# Patient Record
Sex: Female | Born: 1975 | Race: White | Hispanic: No | Marital: Married | Smoking: Former smoker
Health system: Southern US, Community
[De-identification: ages and names within clinical notes are randomized; demographics above are authoritative.]

## PROBLEM LIST (undated history)

## (undated) DIAGNOSIS — R011 Cardiac murmur, unspecified: Secondary | ICD-10-CM

## (undated) DIAGNOSIS — E785 Hyperlipidemia, unspecified: Secondary | ICD-10-CM

## (undated) HISTORY — PX: APPENDECTOMY: SHX54

## (undated) HISTORY — PX: ABDOMINAL HYSTERECTOMY: SHX81

## (undated) HISTORY — PX: OTHER SURGICAL HISTORY: SHX169

## (undated) HISTORY — DX: Hyperlipidemia, unspecified: E78.5

## (undated) HISTORY — DX: Cardiac murmur, unspecified: R01.1

## (undated) HISTORY — PX: JOINT REPLACEMENT: SHX530

## (undated) HISTORY — PX: KNEE ARTHROSCOPY: SUR90

## (undated) HISTORY — PX: CHOLECYSTECTOMY: SHX55

## (undated) HISTORY — PX: DILATION AND CURETTAGE, DIAGNOSTIC / THERAPEUTIC: SUR384

## (undated) HISTORY — PX: TUBAL LIGATION: SHX77

---

## 1998-04-08 ENCOUNTER — Inpatient Hospital Stay (HOSPITAL_COMMUNITY): Admission: AD | Admit: 1998-04-08 | Discharge: 1998-04-08 | Payer: Self-pay | Admitting: Obstetrics & Gynecology

## 1998-04-10 ENCOUNTER — Inpatient Hospital Stay (HOSPITAL_COMMUNITY): Admission: AD | Admit: 1998-04-10 | Discharge: 1998-04-10 | Payer: Self-pay | Admitting: Obstetrics

## 1998-08-05 ENCOUNTER — Emergency Department (HOSPITAL_COMMUNITY): Admission: EM | Admit: 1998-08-05 | Discharge: 1998-08-05 | Payer: Self-pay | Admitting: Emergency Medicine

## 1999-05-29 ENCOUNTER — Emergency Department (HOSPITAL_COMMUNITY): Admission: EM | Admit: 1999-05-29 | Discharge: 1999-05-29 | Payer: Self-pay | Admitting: Emergency Medicine

## 1999-06-27 ENCOUNTER — Inpatient Hospital Stay (HOSPITAL_COMMUNITY): Admission: EM | Admit: 1999-06-27 | Discharge: 1999-06-28 | Payer: Self-pay | Admitting: Emergency Medicine

## 1999-06-27 ENCOUNTER — Encounter: Payer: Self-pay | Admitting: Emergency Medicine

## 1999-12-19 ENCOUNTER — Emergency Department (HOSPITAL_COMMUNITY): Admission: EM | Admit: 1999-12-19 | Discharge: 1999-12-19 | Payer: Self-pay | Admitting: Emergency Medicine

## 2001-02-16 ENCOUNTER — Emergency Department (HOSPITAL_COMMUNITY): Admission: EM | Admit: 2001-02-16 | Discharge: 2001-02-16 | Payer: Self-pay | Admitting: *Deleted

## 2007-03-27 ENCOUNTER — Ambulatory Visit (HOSPITAL_BASED_OUTPATIENT_CLINIC_OR_DEPARTMENT_OTHER): Admission: RE | Admit: 2007-03-27 | Discharge: 2007-03-27 | Payer: Self-pay | Admitting: Orthopedic Surgery

## 2013-01-05 ENCOUNTER — Emergency Department (HOSPITAL_COMMUNITY)
Admission: EM | Admit: 2013-01-05 | Discharge: 2013-01-05 | Disposition: A | Discharge status: Home / Self Care | Payer: Self-pay | Attending: Emergency Medicine | Admitting: Emergency Medicine

## 2013-01-05 ENCOUNTER — Emergency Department (HOSPITAL_COMMUNITY): Payer: Self-pay

## 2013-01-05 ENCOUNTER — Encounter (HOSPITAL_COMMUNITY): Payer: Self-pay | Admitting: Emergency Medicine

## 2013-01-05 DIAGNOSIS — X500XXA Overexertion from strenuous movement or load, initial encounter: Secondary | ICD-10-CM | POA: Insufficient documentation

## 2013-01-05 DIAGNOSIS — Z87891 Personal history of nicotine dependence: Secondary | ICD-10-CM | POA: Insufficient documentation

## 2013-01-05 DIAGNOSIS — Z79899 Other long term (current) drug therapy: Secondary | ICD-10-CM | POA: Insufficient documentation

## 2013-01-05 DIAGNOSIS — S92901A Unspecified fracture of right foot, initial encounter for closed fracture: Secondary | ICD-10-CM

## 2013-01-05 DIAGNOSIS — Y92009 Unspecified place in unspecified non-institutional (private) residence as the place of occurrence of the external cause: Secondary | ICD-10-CM | POA: Insufficient documentation

## 2013-01-05 DIAGNOSIS — Y9341 Activity, dancing: Secondary | ICD-10-CM | POA: Insufficient documentation

## 2013-01-05 DIAGNOSIS — S92909A Unspecified fracture of unspecified foot, initial encounter for closed fracture: Secondary | ICD-10-CM | POA: Insufficient documentation

## 2013-01-05 MED ORDER — MELOXICAM 15 MG PO TABS: 15.0000 mg | ORAL_TABLET | Freq: Every day | ORAL | Status: DC

## 2013-01-05 NOTE — ED Provider Notes (Signed)
History  This chart was scribed for Arthor Captain, PA-C working with Richardean Canal, MD by Shari Heritage, ED Scribe. This patient was seen in room WTR8/WTR8 and the patient's care was started at 1839.  CSN: [id number]  Arrival date & time 01/05/13  1719   First MD Initiated Contact with Patient 01/05/13 1839      Chief Complaint  Patient presents with  . Foot Pain     The history is provided by the patient. No language interpreter was used.    HPI Comments: Nichole Gonzalez is a 37 y.o. female who presents to the Emergency Department complaining of moderate to severe, constant, non-radiating, dull right foot pain and swelling onset 2.5 days ago. Patient says that she was dancing at home early Saturday morning when she heard a pop in her right foot. Patient says that she had immediate pain when it occurred and swelling began several hours later. Patient is ambulatory with pain and limp. She reports that she has fractured this same foot before and had surgery for repair. Patient's other surgical history includes cholecystectomy, appendectomy and tubal ligation.  History reviewed. No pertinent past medical history.  Past Surgical History  Procedure Date  . Knee arthroscopy   . Cholecystectomy   . Appendectomy   . Tubal ligation   . Right foot     No family history on file.  History  Substance Use Topics  . Smoking status: Former Games developer  . Smokeless tobacco: Not on file  . Alcohol Use: No    OB History    Grav Para Term Preterm Abortions TAB SAB Ect Mult Living                  Review of Systems A complete 10 system review of systems was obtained and all systems are negative except as noted in the HPI and PMH.   Allergies  Codeine  Home Medications   Current Outpatient Rx  Name  Route  Sig  Dispense  Refill  . CAFFEINE 200 MG PO TABS   Oral   Take 200 mg by mouth daily. For energy.         Marland Kitchen FAMOTIDINE 20 MG PO TABS   Oral   Take 20 mg by mouth daily.        Marland Kitchen LORATADINE 10 MG PO TABS   Oral   Take 10 mg by mouth daily.         . ADULT MULTIVITAMIN W/MINERALS CH   Oral   Take 1 tablet by mouth daily.         Marland Kitchen NICOTINE 21 MG/24HR TD PT24   Transdermal   Place 1 patch onto the skin daily.         Marland Kitchen ULTRA SLIM QUICK PO   Oral   Take 1 capsule by mouth 2 (two) times daily.           Triage Vitals: BP 146/88  Pulse 79  Temp 98.2 F (36.8 C)  Resp 18  SpO2 98%  LMP 01/01/2013  Physical Exam  Nursing note and vitals reviewed. Constitutional: She is oriented to person, place, and time. She appears well-developed and well-nourished. No distress.  HENT:  Head: Normocephalic and atraumatic.  Eyes: EOM are normal.  Neck: Neck supple. No tracheal deviation present.  Cardiovascular: Normal rate.   Pulmonary/Chest: Effort normal. No respiratory distress.  Musculoskeletal: Normal range of motion. She exhibits tenderness.       Right ankle: tenderness. Lateral malleolus  tenderness found.       Right foot: She exhibits bony tenderness and swelling.       Swelling of the lateral aspect of the right foot and the dorsal surface with deep ecchymosis. Swelling extends to the lateral malleolus. There's ecchymosis of the 5th toe. Minimally tender to palpation. Tender with ambulation. Neurovascularly intact.  Neurological: She is alert and oriented to person, place, and time.  Skin: Skin is warm and dry.  Psychiatric: She has a normal mood and affect. Her behavior is normal.    ED Course  Procedures (including critical care time) DIAGNOSTIC STUDIES: Oxygen Saturation is 98% on room air, normal by my interpretation.    COORDINATION OF CARE: 8:32 PM- Patient informed of current plan for treatment and evaluation and agrees with plan at this time.    Dg Foot Complete Right  01/05/2013  *RADIOLOGY REPORT*  Clinical Data: Injury involving the lateral aspect of the foot 2 days ago  RIGHT FOOT COMPLETE - 3+ VIEW  Comparison: None.   Findings:  There is a nondisplaced spiral/oblique fracture of the distal metadiaphysis of the fifth metatarsal.  The fracture does not appear to extend into the adjacent MCP joint. Minimal amount of expected soft tissue swelling.  No radiopaque foreign body.  No other fractures identified.  Incidental note is made of a small os peroneus.  Joint spaces are preserved.  Mild hallux valgus deformity.  IMPRESSION: Nondisplaced spiral / oblique fracture of the distal metadiaphysis of the fifth metatarsal without definite intra-articular extension.   Original Report Authenticated By: Tacey Ruiz, MD      1. Foot fracture, right       MDM    8:35 PM Filed Vitals:   01/05/13 1827  BP: 146/88  Pulse: 79  Temp: 98.2 F (36.8 C)  Resp: 18  SpO2: 98%    Shouldn't with nondisplaced oblique fracture of the distal fifth metatarsal of the right foot.  I have reviewed the x-ray personally.  I've also reviewed x-ray with Dr. Silverio Lay.  Patient will be given postop shoe and sufficient padding.  She will also be given crutches and instructed not to apply pressure to the foot at all.  She will also be provided followup with Dr. Ave Filter at [facility].  Patient expresses understanding and agrees with plan.   I personally performed the services described in this documentation, which was scribed in my presence. The recorded information has been reviewed and is accurate.     Arthor Captain, PA-C 01/06/13 1034

## 2013-01-05 NOTE — ED Notes (Signed)
Pt reports dancing around at home Saturday night and heard a pop in right foot. Pt has pain and swelling and is 10/10 when ambulating and resting 5/10. Swelling noted to right foot. Pt ambulated with limp. Pulses present in right foot.

## 2013-01-06 NOTE — ED Provider Notes (Signed)
Medical screening examination/treatment/procedure(s) were performed by non-physician practitioner and as supervising physician I was immediately available for consultation/collaboration.   Richardean Canal, MD 01/06/13 1452

## 2013-11-13 ENCOUNTER — Encounter: Payer: Self-pay | Admitting: Internal Medicine

## 2014-08-16 ENCOUNTER — Emergency Department (HOSPITAL_COMMUNITY)
Admission: EM | Admit: 2014-08-16 | Discharge: 2014-08-16 | Disposition: A | Discharge status: Home / Self Care | Payer: BC Managed Care – PPO | Attending: Emergency Medicine | Admitting: Emergency Medicine

## 2014-08-16 ENCOUNTER — Encounter (HOSPITAL_COMMUNITY): Payer: Self-pay | Admitting: Emergency Medicine

## 2014-08-16 DIAGNOSIS — M7710 Lateral epicondylitis, unspecified elbow: Secondary | ICD-10-CM | POA: Insufficient documentation

## 2014-08-16 DIAGNOSIS — M25529 Pain in unspecified elbow: Secondary | ICD-10-CM | POA: Insufficient documentation

## 2014-08-16 DIAGNOSIS — F17200 Nicotine dependence, unspecified, uncomplicated: Secondary | ICD-10-CM | POA: Diagnosis not present

## 2014-08-16 DIAGNOSIS — Z79899 Other long term (current) drug therapy: Secondary | ICD-10-CM | POA: Diagnosis not present

## 2014-08-16 DIAGNOSIS — M7711 Lateral epicondylitis, right elbow: Secondary | ICD-10-CM

## 2014-08-16 MED ORDER — HYDROCODONE-ACETAMINOPHEN 5-325 MG PO TABS: 1.0000 | ORAL_TABLET | Freq: Four times a day (QID) | ORAL | Status: DC | PRN

## 2014-08-16 MED ORDER — IBUPROFEN 800 MG PO TABS: 800.0000 mg | ORAL_TABLET | Freq: Three times a day (TID) | ORAL | Status: DC

## 2014-08-16 NOTE — Discharge Instructions (Signed)
Tennis Elbow Your caregiver has diagnosed you with a condition often referred to as "tennis elbow." This results from small tears or soreness (inflammation) at the start (origin) of the extensor muscles of the forearm. Although the condition is often called tennis or golfer's elbow, it is caused by any repetitive action performed by your elbow. HOME CARE INSTRUCTIONS  If the condition has been short lived, rest may be the only treatment required. Using your opposite hand or arm to perform the task may help. Even changing your grip may help rest the extremity. These may even prevent the condition from recurring.  Longer standing problems, however, will often be relieved faster by:  Using anti-inflammatory agents.  Applying ice packs for 30 minutes at the end of the working day, at bed time, or when activities are finished.  Your caregiver may also have you wear a splint or sling. This will allow the inflamed tendon to heal. At times, steroid injections aided with a local anesthetic will be required along with splinting for 1 to 2 weeks. Two to three steroid injections will often solve the problem. In some long standing cases, the inflamed tendon does not respond to conservative (non-surgical) therapy. Then surgery may be required to repair it. MAKE SURE YOU:   Understand these instructions.  Will watch your condition.  Will get help right away if you are not doing well or get worse. Document Released: 12/10/2005 Document Revised: 03/03/2012 Document Reviewed: 07/28/2008 [facility] [employer] Patient Information 2015 [place], [place]. This information is not intended to replace advice given to you by your health care provider. Make sure you discuss any questions you have with your health care provider.  Lateral Epicondylitis (Tennis Elbow) with Rehab Lateral epicondylitis involves inflammation and pain around the outer portion of the elbow. The pain is caused by inflammation of the tendons in the forearm  that bring back (extend) the wrist. Lateral epicondylitis is also called tennis elbow, because it is very common in tennis players. However, it may occur in any individual who extends the wrist repetitively. If lateral epicondylitis is left untreated, it may become a chronic problem. SYMPTOMS   Pain, tenderness, and inflammation on the outer (lateral) side of the elbow.  Pain or weakness with gripping activities.  Pain that increases with wrist-twisting motions (playing tennis, using a screwdriver, opening a door or a jar).  Pain with lifting objects, including a coffee cup. CAUSES  Lateral epicondylitis is caused by inflammation of the tendons that extend the wrist. Causes of injury may include:  Repetitive stress and strain on the muscles and tendons that extend the wrist.  Sudden change in activity level or intensity.  Incorrect grip in racquet sports.  Incorrect grip size of racquet (often too large).  Incorrect hitting position or technique (usually backhand, leading with the elbow).  Using a racket that is too heavy. RISK INCREASES WITH:  Sports or occupations that require repetitive and/or strenuous forearm and wrist movements (tennis, squash, racquetball, carpentry).  Poor wrist and forearm strength and flexibility.  Failure to warm up properly before activity.  Resuming activity before healing, rehabilitation, and conditioning are complete. PREVENTION   Warm up and stretch properly before activity.  Maintain physical fitness:  Strength, flexibility, and endurance.  Cardiovascular fitness.  Wear and use properly fitted equipment.  Learn and use proper technique and have a coach correct improper technique.  Wear a tennis elbow (counterforce) brace. PROGNOSIS  The course of this condition depends on the degree of the injury. If treated  properly, acute cases (symptoms lasting less than 4 weeks) are often resolved in 2 to 6 weeks. Chronic (longer lasting cases)  often resolve in 3 to 6 months but may require physical therapy. RELATED COMPLICATIONS   Frequently recurring symptoms, resulting in a chronic problem. Properly treating the problem the first time decreases frequency of recurrence.  Chronic inflammation, scarring tendon degeneration, and partial tendon tear, requiring surgery.  Delayed healing or resolution of symptoms. TREATMENT  Treatment first involves the use of ice and medicine to reduce pain and inflammation. Strengthening and stretching exercises may help reduce discomfort if performed regularly. These exercises may be performed at home if the condition is an acute injury. Chronic cases may require a referral to a physical therapist for evaluation and treatment. Your caregiver may advise a corticosteroid injection to help reduce inflammation. Rarely, surgery is needed. MEDICATION  If pain medicine is needed, nonsteroidal anti-inflammatory medicines (aspirin and ibuprofen), or other minor pain relievers (acetaminophen), are often advised.  Do not take pain medicine for 7 days before surgery.  Prescription pain relievers may be given, if your caregiver thinks they are needed. Use only as directed and only as much as you need.  Corticosteroid injections may be recommended. These injections should be reserved only for the most severe cases, because they can only be given a certain number of times. HEAT AND COLD  Cold treatment (icing) should be applied for 10 to 15 minutes every 2 to 3 hours for inflammation and pain, and immediately after activity that aggravates your symptoms. Use ice packs or an ice massage.  Heat treatment may be used before performing stretching and strengthening activities prescribed by your caregiver, physical therapist, or athletic trainer. Use a heat pack or a warm water soak. SEEK MEDICAL CARE IF: Symptoms get worse or do not improve in 2 weeks, despite treatment. EXERCISES  RANGE OF MOTION (ROM) AND  STRETCHING EXERCISES - Epicondylitis, Lateral (Tennis Elbow) These exercises may help you when beginning to rehabilitate your injury. Your symptoms may go away with or without further involvement from your physician, physical therapist, or athletic trainer. While completing these exercises, remember:   Restoring tissue flexibility helps normal motion to return to the joints. This allows healthier, less painful movement and activity.  An effective stretch should be held for at least 30 seconds.  A stretch should never be painful. You should only feel a gentle lengthening or release in the stretched tissue. RANGE OF MOTION - Wrist Flexion, Active-Assisted  Extend your right / left elbow with your fingers pointing down.*  Gently pull the back of your hand towards you, until you feel a gentle stretch on the top of your forearm.  Hold this position for __________ seconds. Repeat __________ times. Complete this exercise __________ times per day.  *If directed by your physician, physical therapist or athletic trainer, complete this stretch with your elbow bent, rather than extended. RANGE OF MOTION - Wrist Extension, Active-Assisted  Extend your right / left elbow and turn your palm upwards.*  Gently pull your palm and fingertips back, so your wrist extends and your fingers point more toward the ground.  You should feel a gentle stretch on the inside of your forearm.  Hold this position for __________ seconds. Repeat __________ times. Complete this exercise __________ times per day. *If directed by your physician, physical therapist or athletic trainer, complete this stretch with your elbow bent, rather than extended. STRETCH - Wrist Flexion  Place the back of your right / left  hand on a tabletop, leaving your elbow slightly bent. Your fingers should point away from your body.  Gently press the back of your hand down onto the table by straightening your elbow. You should feel a stretch on  the top of your forearm.  Hold this position for __________ seconds. Repeat __________ times. Complete this stretch __________ times per day.  STRETCH - Wrist Extension   Place your right / left fingertips on a tabletop, leaving your elbow slightly bent. Your fingers should point backwards.  Gently press your fingers and palm down onto the table by straightening your elbow. You should feel a stretch on the inside of your forearm.  Hold this position for __________ seconds. Repeat __________ times. Complete this stretch __________ times per day.  STRENGTHENING EXERCISES - Epicondylitis, Lateral (Tennis Elbow) These exercises may help you when beginning to rehabilitate your injury. They may resolve your symptoms with or without further involvement from your physician, physical therapist, or athletic trainer. While completing these exercises, remember:   Muscles can gain both the endurance and the strength needed for everyday activities through controlled exercises.  Complete these exercises as instructed by your physician, physical therapist or athletic trainer. Increase the resistance and repetitions only as guided.  You may experience muscle soreness or fatigue, but the pain or discomfort you are trying to eliminate should never worsen during these exercises. If this pain does get worse, stop and make sure you are following the directions exactly. If the pain is still present after adjustments, discontinue the exercise until you can discuss the trouble with your caregiver. STRENGTH - Wrist Flexors  Sit with your right / left forearm palm-up and fully supported on a table or countertop. Your elbow should be resting below the height of your shoulder. Allow your wrist to extend over the edge of the surface.  Loosely holding a __________ weight, or a piece of rubber exercise band or tubing, slowly curl your hand up toward your forearm.  Hold this position for __________ seconds. Slowly lower  the wrist back to the starting position in a controlled manner. Repeat __________ times. Complete this exercise __________ times per day.  STRENGTH - Wrist Extensors  Sit with your right / left forearm palm-down and fully supported on a table or countertop. Your elbow should be resting below the height of your shoulder. Allow your wrist to extend over the edge of the surface.  Loosely holding a __________ weight, or a piece of rubber exercise band or tubing, slowly curl your hand up toward your forearm.  Hold this position for __________ seconds. Slowly lower the wrist back to the starting position in a controlled manner. Repeat __________ times. Complete this exercise __________ times per day.  STRENGTH - Ulnar Deviators  Stand with a ____________________ weight in your right / left hand, or sit while holding a rubber exercise band or tubing, with your healthy arm supported on a table or countertop.  Move your wrist, so that your pinkie travels toward your forearm and your thumb moves away from your forearm.  Hold this position for __________ seconds and then slowly lower the wrist back to the starting position. Repeat __________ times. Complete this exercise __________ times per day STRENGTH - Radial Deviators  Stand with a ____________________ weight in your right / left hand, or sit while holding a rubber exercise band or tubing, with your injured arm supported on a table or countertop.  Raise your hand upward in front of you or pull up  on the rubber tubing.  Hold this position for __________ seconds and then slowly lower the wrist back to the starting position. Repeat __________ times. Complete this exercise __________ times per day. STRENGTH - Forearm Supinators   Sit with your right / left forearm supported on a table, keeping your elbow below shoulder height. Rest your hand over the edge, palm down.  Gently grip a hammer or a soup ladle.  Without moving your elbow, slowly turn  your palm and hand upward to a "thumbs-up" position.  Hold this position for __________ seconds. Slowly return to the starting position. Repeat __________ times. Complete this exercise __________ times per day.  STRENGTH - Forearm Pronators   Sit with your right / left forearm supported on a table, keeping your elbow below shoulder height. Rest your hand over the edge, palm up.  Gently grip a hammer or a soup ladle.  Without moving your elbow, slowly turn your palm and hand upward to a "thumbs-up" position.  Hold this position for __________ seconds. Slowly return to the starting position. Repeat __________ times. Complete this exercise __________ times per day.  STRENGTH - Grip  Grasp a tennis ball, a dense sponge, or a large, rolled sock in your hand.  Squeeze as hard as you can, without increasing any pain.  Hold this position for __________ seconds. Release your grip slowly. Repeat __________ times. Complete this exercise __________ times per day.  STRENGTH - Elbow Extensors, Isometric  Stand or sit upright, on a firm surface. Place your right / left arm so that your palm faces your stomach, and it is at the height of your waist.  Place your opposite hand on the underside of your forearm. Gently push up as your right / left arm resists. Push as hard as you can with both arms, without causing any pain or movement at your right / left elbow. Hold this stationary position for __________ seconds. Gradually release the tension in both arms. Allow your muscles to relax completely before repeating. Document Released: 12/10/2005 Document Revised: 04/26/2014 Document Reviewed: 03/24/2009 [facility] Patient Information 2015 [place], [place]. This information is not intended to replace advice given to you by your health care provider. Make sure you discuss any questions you have with your health care provider.

## 2014-08-16 NOTE — ED Notes (Signed)
Pt c/o right elbow pain for two months. In the past week the pain has intensified and now pt states she cant even lift a bottle of water with that arm and right hand has been swelling since Saturday. Pt states that she has tried ice packs but hasnt gone away.

## 2014-08-16 NOTE — ED Provider Notes (Signed)
Medical screening examination/treatment/procedure(s) were performed by non-physician practitioner and as supervising physician I was immediately available for consultation/collaboration.    Quante Pettry, MD 08/16/14 1502 

## 2014-08-16 NOTE — ED Provider Notes (Signed)
CSN: [id number]     Arrival date & time 08/16/14  1028 History   First MD Initiated Contact with Patient 08/16/14 1033     Chief Complaint  Patient presents with  . Elbow Pain     (Consider location/radiation/quality/duration/timing/severity/associated sxs/prior Treatment) HPI Comments: Patient presents to the emergency department with chief complaint of right elbow pain x2 months. She states that it is slowly, but progressively worsened over the past couple months. She states that sometimes the pain is so bad that limits her from lifting things. She states that she has a left and a load of boxes at work. She reports mild to moderate associated swelling. She reports some mild associated numbness and tingling. She denies any fevers, or chills. She has not tried taking anything to alleviate the symptoms. The pain is aggravated with movement. She denies any known mechanism of injury.  The history is provided by the patient. No language interpreter was used.    History reviewed. No pertinent past medical history. Past Surgical History  Procedure Laterality Date  . Knee arthroscopy    . Cholecystectomy    . Appendectomy    . Tubal ligation    . Right foot     No family history on file. History  Substance Use Topics  . Smoking status: Current Every Day Smoker    Types: Cigarettes  . Smokeless tobacco: Not on file  . Alcohol Use: No   OB History   Grav Para Term Preterm Abortions TAB SAB Ect Mult Living                 Review of Systems  Constitutional: Negative for fever and chills.  Respiratory: Negative for shortness of breath.   Cardiovascular: Negative for chest pain.  Gastrointestinal: Negative for nausea, vomiting, diarrhea and constipation.  Genitourinary: Negative for dysuria.  Musculoskeletal: Positive for arthralgias.      Allergies  Codeine  Home Medications   Prior to Admission medications   Medication Sig Start Date End Date Taking? Authorizing Provider   aspirin-acetaminophen-caffeine (EXCEDRIN EXTRA STRENGTH) [phone] MG per tablet Take 2 tablets by mouth every 6 (six) hours as needed for headache (and pain).   Yes Historical Provider, MD  dicyclomine (BENTYL) 10 MG capsule Take 10 mg by mouth daily. Can take it twice a day if needed   Yes Historical Provider, MD  loratadine (CLARITIN) 10 MG tablet Take 10 mg by mouth daily.   Yes Historical Provider, MD  montelukast (SINGULAIR) 10 MG tablet Take 10 mg by mouth daily with breakfast.   Yes Historical Provider, MD  Multiple Vitamin (MULTIVITAMIN WITH MINERALS) TABS Take 1 tablet by mouth daily.   Yes Historical Provider, MD  omeprazole (PRILOSEC) 40 MG capsule Take 40 mg by mouth daily.   Yes Historical Provider, MD  HYDROcodone-acetaminophen (NORCO/VICODIN) 5-325 MG per tablet Take 1 tablet by mouth every 6 (six) hours as needed for moderate pain or severe pain. 08/16/14   Roxy Horseman, PA-C  ibuprofen (ADVIL,MOTRIN) 800 MG tablet Take 1 tablet (800 mg total) by mouth 3 (three) times daily. 08/16/14   Roxy Horseman, PA-C   BP 130/87  Pulse 81  Temp(Src) 98.8 F (37.1 C) (Oral)  Resp 17  SpO2 100%  LMP 07/19/2014 Physical Exam  Nursing note and vitals reviewed. Constitutional: She is oriented to person, place, and time. She appears well-developed and well-nourished.  HENT:  Head: Normocephalic and atraumatic.  Eyes: Conjunctivae and EOM are normal.  Neck: Normal range of motion.  Cardiovascular: Normal rate.   Pulmonary/Chest: Effort normal.  Abdominal: She exhibits no distension.  Musculoskeletal: Normal range of motion.  Tenderness to palpation over the right lateral epicondyle, no bony abnormality or deformity, increased pain with resisted supination and pronation of the forearm, wrist range of motion and strength 5/5, elbow range of motion and strength 5/5  Neurological: She is alert and oriented to person, place, and time.  Sensation intact throughout  Skin: Skin is dry.  No  erythema, no evidence of cellulitis, or septic joint  Psychiatric: She has a normal mood and affect. Her behavior is normal. Judgment and thought content normal.    ED Course  Procedures (including critical care time) Labs Review Labs Reviewed - No data to display  Imaging Review No results found.   EKG Interpretation None      MDM   Final diagnoses:  Lateral epicondylitis (tennis elbow), right   Patient with suspected lateral epicondylitis to 2 months. Will treat with NSAIDs, and some pain medicine. Recommend Rice therapy, as well as hand/physical therapy followup. Patient understands and agrees with the plan. She is stable and ready for discharge.    Roxy Horseman, PA-C 08/16/14 1120

## 2015-02-17 ENCOUNTER — Ambulatory Visit (INDEPENDENT_AMBULATORY_CARE_PROVIDER_SITE_OTHER): Payer: Self-pay | Admitting: Endocrinology

## 2015-02-17 ENCOUNTER — Encounter: Payer: Self-pay | Admitting: Endocrinology

## 2015-02-17 VITALS — BP 130/90 | HR 88 | Temp 98.3°F | Wt 115.0 lb

## 2015-02-17 DIAGNOSIS — R5383 Other fatigue: Secondary | ICD-10-CM | POA: Insufficient documentation

## 2015-02-17 DIAGNOSIS — R5382 Chronic fatigue, unspecified: Secondary | ICD-10-CM

## 2015-02-17 DIAGNOSIS — R7989 Other specified abnormal findings of blood chemistry: Secondary | ICD-10-CM | POA: Insufficient documentation

## 2015-02-17 DIAGNOSIS — E0590 Thyrotoxicosis, unspecified without thyrotoxic crisis or storm: Secondary | ICD-10-CM

## 2015-02-17 HISTORY — DX: Other specified abnormal findings of blood chemistry: R79.89

## 2015-02-17 LAB — T3, FREE: T3, Free: 4.1 pg/mL (ref 2.3–4.2)

## 2015-02-17 LAB — TSH: TSH: 0.24 u[IU]/mL — AB (ref 0.35–4.50)

## 2015-02-17 LAB — T4, FREE: FREE T4: 1.03 ng/dL (ref 0.60–1.60)

## 2015-02-17 NOTE — Progress Notes (Signed)
Patient ID: Nichole RibasKatie S Gonzalez, female   DOB: 1976/08/02, 39 y.o.   MRN: [id number]                                                                                                                Reason for Appointment:  Hyperthyroidism, new consultation  Referring physician: [facility]   History of Present Illness:   She has been complaining of feeling excessively tired for several months, probably a year or more. She says she also has had difficulty with early and late insomnia for quite some time She wakes up feeling tired.  Also for the last 4 weeks or so she has periodically felt her heart racing.  Initially she thought this was caused by her trying to quit smoking but she still has some symptoms. She does feel anxious at times; she may feel her hands or legs being shaky especially her legs when she is driving. She does not have heat intolerance but at times will feel warm but not sweaty. She does not complain of any new discomfort or soreness in her neck area; she tends to have any in her neck all around on and off anyway  She has had difficulty with progressive weight gain over the last 1-2 years and has gained as much as 50 pounds   The patient was evaluated with thyroid function tests because of her fatigue which showed that the TSH was low at 0.19. Her TSH was normal in 05/2014 Free T4 not done     Medication List       This list is accurate as of: 02/17/15  5:04 PM.  Always use your most recent med list.               BENTYL 10 MG capsule  Generic drug:  dicyclomine  Take 10 mg by mouth daily. Can take it twice a day if needed     CHANTIX STARTING MONTH PAK 0.5 MG X 11 & 1 MG X 42 tablet  Generic drug:  varenicline     diphenhydrAMINE 25 mg capsule  Commonly known as:  BENADRYL  Take 25 mg by mouth every 6 (six) hours as needed.     EXCEDRIN EXTRA STRENGTH 250-250-65 MG per tablet  Generic drug:  aspirin-acetaminophen-caffeine  Take 2 tablets by mouth every 6 (six)  hours as needed for headache (and pain).     ibuprofen 800 MG tablet  Commonly known as:  ADVIL,MOTRIN  Take 1 tablet (800 mg total) by mouth 3 (three) times daily.     loratadine 10 MG tablet  Commonly known as:  CLARITIN  Take 10 mg by mouth daily.     meloxicam 7.5 MG tablet  Commonly known as:  MOBIC     montelukast 10 MG tablet  Commonly known as:  SINGULAIR  Take 10 mg by mouth daily with breakfast.     multivitamin with minerals Tabs tablet  Take 1 tablet by mouth daily.     omeprazole 40 MG capsule  Commonly known  as:  PRILOSEC  Take 40 mg by mouth daily.     traZODone 50 MG tablet  Commonly known as:  DESYREL     venlafaxine 100 MG tablet  Commonly known as:  EFFEXOR            No past medical history on file.  Past Surgical History  Procedure Laterality Date  . Knee arthroscopy    . Cholecystectomy    . Appendectomy    . Tubal ligation    . Right foot      Family History  Problem Relation Age of Onset  . Thyroid disease Sister     Social History:  reports that she has been smoking Cigarettes.  She does not have any smokeless tobacco history on file. She reports that she does not drink alcohol. Her drug history is not on file.  Allergies:  Allergies  Allergen Reactions  . Codeine Other (See Comments)    Fainting.    Review of Systems:  Has no  history of high blood pressure.       No history of dyspnea on exertion.      No complaints of change in bowel habits.       There is no history of Diabetes.     No swelling of legs present    Nausea present at times   She has had depression for 15 years, recently worse in her Effexor was increased  She has not had any regular menstrual cycles recently since she was given Mirena   Examination:   BP 130/90 mmHg  Pulse 88  Temp(Src) 98.3 F (36.8 C) (Oral)  Wt 115 lb (52.164 kg)  SpO2 96%   General Appearance:  well-built and nourished, pleasant, not anxious or hyperkinetic. she speaks in  a relatively low voice.       Eyes: No prominence, lid lag or stare. No swelling of the eyelids  Neck: The thyroid isjust palpable on the left side, smooth and soft.  Right lobe is not palpable.  No tenderness over the thyroid There is no lymphadenopathy .          Heart: heart rate 80, regular;normal S1 and S2, no murmurs .          Lungs: breath sounds are clear bilaterally Abdomen: no hepatosplenomegaly or other palpable abnormality  Extremities: hands are warm. No ankle edema. Neurological: Deep tendon reflexes at biceps arenormal No tremors are present.    Assessment/Plan:  ? Hyperthyroidism  Although she has had a low TSH level it is not completely suppressed in the hyperthyroid range She does have mild symptoms suggestive of hyperthyroidism including palpitations, tremor at times and someheat intolerance However she also has significant issues with depression and anxiety Also objectively she has no significant signs of hyperthyroidism at present  Discussed with the patient that she may have early Graves' disease related to autoimmune dysfunction However she may also have silent thyroiditis with transiently low TSH  She will have with full thyroid panel checked today and will decide on further management based on the results   [facility] 02/17/2015, 5:04 PM   Addendum: Labs show mild decrease in TSH along with high normal free T3 probably indicating mild hyperthyroidism. She will be given a trial of PTU 50 mg twice a day and follow-up in 3 weeks

## 2015-02-17 NOTE — Progress Notes (Signed)
Quick Note:  Please let patient knowLabs show very mild hyperthyroidism and may benefit from a trial of a low dose medication. She needs prescription for PTU 50 mg twice a day and follow-up in 3 weeks, labs before follow-up   ______

## 2015-02-18 ENCOUNTER — Other Ambulatory Visit: Payer: Self-pay

## 2015-02-18 MED ORDER — PROPYLTHIOURACIL 50 MG PO TABS: 50.0000 mg | ORAL_TABLET | Freq: Two times a day (BID) | ORAL | Status: DC

## 2015-03-17 ENCOUNTER — Other Ambulatory Visit (INDEPENDENT_AMBULATORY_CARE_PROVIDER_SITE_OTHER): Payer: BLUE CROSS/BLUE SHIELD

## 2015-03-17 DIAGNOSIS — E0590 Thyrotoxicosis, unspecified without thyrotoxic crisis or storm: Secondary | ICD-10-CM | POA: Diagnosis not present

## 2015-03-17 LAB — T3, FREE: T3, Free: 3.4 pg/mL (ref 2.3–4.2)

## 2015-03-17 LAB — TSH: TSH: 0.07 u[IU]/mL — ABNORMAL LOW (ref 0.35–4.50)

## 2015-03-18 ENCOUNTER — Other Ambulatory Visit: Payer: BLUE CROSS/BLUE SHIELD

## 2015-03-21 ENCOUNTER — Encounter: Payer: Self-pay | Admitting: Endocrinology

## 2015-03-21 ENCOUNTER — Ambulatory Visit (INDEPENDENT_AMBULATORY_CARE_PROVIDER_SITE_OTHER): Payer: BLUE CROSS/BLUE SHIELD | Admitting: Endocrinology

## 2015-03-21 VITALS — BP 118/84 | HR 95 | Temp 97.7°F | Resp 16 | Ht 66.0 in | Wt 202.4 lb

## 2015-03-21 DIAGNOSIS — E0590 Thyrotoxicosis, unspecified without thyrotoxic crisis or storm: Secondary | ICD-10-CM | POA: Diagnosis not present

## 2015-03-21 DIAGNOSIS — R5382 Chronic fatigue, unspecified: Secondary | ICD-10-CM

## 2015-03-21 MED ORDER — METHIMAZOLE 5 MG PO TABS: 5.0000 mg | ORAL_TABLET | Freq: Every day | ORAL | Status: DC

## 2015-03-21 NOTE — Patient Instructions (Signed)
Change to methimazole        ANTITHYROID DRUGS    Antithyroid drugs (also called thionamides) are most often used to treat an overactive thyroid (hyperthyroidism) such as caused by Graves' disease. These drugs work by blocking the formation of thyroid hormone by the thyroid gland  Antithyroid drugs have several benefits and a few side effects. It is important to learn as much as possible about the treatment of Graves' disease and to discuss all of the possible effects of antithyroid drugs with your doctor     FUNCTION OF ANTITHYROID DRUGS - Antithyroid drugs decrease the levels of the two hormones produced by the thyroid, thyroxine (T4) and triiodothyronine (T3).   Antithyroid drugs may be used: ?As a short-term treatment in people with Graves' hyperthyroidism, to prepare for thyroid surgery or radioiodine. ?As a long-term treatment. Approximately 30 percent of people with Graves' disease will have a remission after prolonged treatment with antithyroid drugs.  ?To treat hyperthyroidism associated with toxic multinodular goiter or a toxic adenoma ("hot nodule"), usually to prepare for thyroid surgery or radioiodine.  ?To treat women with hyperthyroidism during pregnancy.  You will need to take antithyroid drugs for at least three weeks (usually six to eight weeks or longer) to lower thyroid hormone levels. This is because they only block formation of new thyroid hormone; they do not remove thyroid hormones that are already in the thyroid and the blood stream. If you frequently miss taking the antithyroid drug, thyroid hormone synthesis may resume quickly and replenish thyroid gland stores, prolonging or preventing adequate control of the hyperthyroidism.  TYPES OF ANTITHYROID DRUGS - Two antithyroid drugs are currently available in the [place]: propylthiouracil (PTU) and methimazole (MMI, Tapazole).   Methimazole (MMI) - MMI is usually preferred over PTU because it reverses  hyperthyroidism more quickly and has fewer side effects. MMI requires an average of six weeks to lower T4 levels to normal and is often given before radioactive iodine treatment. MMI can be taken once per day. Propylthiouracil (PTU) - PTU does not reverse hyperthyroidism as rapidly as MMI and it has more side effects. Because of its potential for liver damage, it is used only when MMI is not appropriate. PTU must be taken two to three times per the day.  Antithyroid drugs during pregnancy - PTU used to be the drug of choice during pregnancy because it causes less severe birth defects than methimazole. But experts now recommend that PTU be given during the first trimester only. This is because there have been rare cases of liver damage in people taking PTU. After the first trimester, women should switch to methimazole for the rest of the pregnancy. For women who are nursing, methimazole is probably a better choice than PTU (to avoid liver side effects). If you take antithyroid drugs, you should discuss your treatment with your doctor before becoming pregnant. Having radioiodine treatment at least six months before becoming pregnant can eliminate the need for antithyroid treatment during pregnancy.  Antithyroid drug side effects - Most of the side effects of antithyroid drugs are minor, but major side effects can occur. Because there is no way to predict who will experience side effects, it is important to discuss all possible side effects before starting treatment.  If you cannot tolerate antithyroid treatments, you can consider radioiodine treatment or surgery.  Minor side effects - Up to 15 percent of people who take an antithyroid drug have minor side effects. Both MMI and PTU can cause itching, rash, hives,  joint pain and swelling, fever, changes in taste, nausea, and vomiting. If one antithyroid drug causes side effects, switching to the other drug may be helpful. However, about half of people who  have side effects with one drug will have similar side effects with the other. Nausea and vomiting may depend on the dose; spreading large doses out through the day can reduce side effects.  Major side effects - Fortunately, the major side effects of antithyroid drugs are very rare. ?Agranulocytosis - Agranulocytosis is a term used to describe a severe decrease in the production of white blood cells. This condition is extremely serious, but affects only one out of every 200 to 500 people who take an antithyroid drug. Elderly people taking PTU and those who take high doses of MMI may be at higher risk of this side effect.  Agranulocytosis more commonly occurs within the first three months of starting treatment with an antithyroid drug, but can occur at any time. If you develop a sore throat, fever, or other signs or symptoms of infection, you should stop your medicine and immediately call your doctor or nurse to have a complete blood count (CBC). Serious and potentially life threatening infections, or even death, can occur before agranulocytosis resolves. However, once the antithyroid drug is stopped, agranulocytosis usually resolves within a week. ?Other - There are three other very rare complications of antithyroid drugs: liver damage (more common with PTU), aplastic anemia (failure of the bone marrow to produce blood cells), and vasculitis (inflammation of blood vessels associated with PTU).  PTU-related liver damage typically occurs within three months of starting the drug. If you develop jaundice, dark urine, light stools, abdominal pain, loss of appetite, nausea, or other evidence of liver dysfunction, you should discontinue the drug immediately and contact your clinician for assessment of liver function. PTU-related liver failure can be serious and potentially life threatening.  The risk of liver damage from PTU is an important concern, particularly in children. For this reason, MMI is the first  choice for treating hyperthyroidism.  MONITORING THYROID HORMONES DURING TREATMENT - During treatment, your blood thyroid hormone levels will be monitored periodically, between every 3-8 weeks . Antithyroid drugs typically reduce levels of both triiodothyronine (T3) and thyroxine (T4), but levels of T3 may take longer to return to normal. Thyroid-stimulating hormone (TSH) levels usually take the longest to return to normal. About 30 percent of people who take an antithyroid drug for one to two years will have prolonged remission of Graves' disease. It is not known if the antithyroid drug plays an active role in this remission or if it simply controls thyroid hormone levels until Graves' disease resolves on its own.  Checking for remission and recurrence - No test can reliably predict remission of Graves' disease. While imperfect, the measurement of TSH-receptor antibodies is widely used in the [place] and [place] to determine if a person is in remission. Usually, after one to two years of treatment, you will stop taking the antithyroid drug. Blood tests are usually performed two to three weeks after stopping the antithyroid drug. The blood tests are periodically repeated over six months to determine if hormone levels remain normal or increase over time (this is called a recurrence). If your TSH level is lower than normal, you have had a recurrence of Graves' disease. This can occur within 10 days of stopping antithyroid drug treatment, or it can occur several years later. If your levels of T3, T4, and TSH remain normal for six months,  the prognosis is good. Recurrence after this time occurs in only 8 to 10 percent of people.'

## 2015-03-21 NOTE — Progress Notes (Signed)
Patient ID: Nichole Gonzalez, female   DOB: Jul 07, 1976, 39 y.o.   MRN: [id number]                                                                                                                Reason for Appointment:  Hyperthyroidism, follow-up visit    Referring physician: [facility]   History of Present Illness:   Background information: She had been complaining of feeling excessively tired for several months, probably a year or more. She says she also has had difficulty with early and late insomnia for quite some time Also for 4 weeks prior to her visit she had periodically felt her heart racing; also felt anxious at times; she may feel her hands or legs being shaky especially her legs when she is driving. She does not have heat intolerance but at times will feel warm but not sweaty.  She has had difficulty with progressive weight gain over the last 1-2 years and has gained as much as 50 pounds   The patient was evaluated with thyroid function tests because of her fatigue which showed that the TSH was low at 0.19. Her TSH was normal in 05/2014 Her free T4 was normal but her free T3 was upper normal at 4.1  She was started on low-dose PTU, 50 mg twice a day She says that with starting this she felt less tired and had no further palpitations However she says that unless she is eating food with the medication she gets nauseated; also was having some nausea before also Also for the last few days she has felt tired again and she is concerned about her weight gain   Wt Readings from Last 3 Encounters:  03/21/15 202 lb 6.4 oz (91.808 kg)  02/17/15 115 lb (52.164 kg)    Her thyroid levels show free T3 to be 3.4, previously 4.1   Lab Results  Component Value Date   TSH 0.07* 03/17/2015   TSH 0.24* 02/17/2015   FREET4 1.03 02/17/2015       Medication List       This list is accurate as of: 03/21/15  4:10 PM.  Always use your most recent med list.               BENTYL 10 MG  capsule  Generic drug:  dicyclomine  Take 10 mg by mouth daily. Can take it twice a day if needed     CHANTIX STARTING MONTH PAK 0.5 MG X 11 & 1 MG X 42 tablet  Generic drug:  varenicline     diphenhydrAMINE 25 mg capsule  Commonly known as:  BENADRYL  Take 25 mg by mouth every 6 (six) hours as needed.     EXCEDRIN EXTRA STRENGTH 250-250-65 MG per tablet  Generic drug:  aspirin-acetaminophen-caffeine  Take 2 tablets by mouth every 6 (six) hours as needed for headache (and pain).     ibuprofen 800 MG tablet  Commonly known as:  ADVIL,MOTRIN  Take 1 tablet (800 mg total) by mouth  3 (three) times daily.     loratadine 10 MG tablet  Commonly known as:  CLARITIN  Take 10 mg by mouth daily.     meloxicam 7.5 MG tablet  Commonly known as:  MOBIC     montelukast 10 MG tablet  Commonly known as:  SINGULAIR  Take 10 mg by mouth daily with breakfast.     multivitamin with minerals Tabs tablet  Take 1 tablet by mouth daily.     omeprazole 40 MG capsule  Commonly known as:  PRILOSEC  Take 40 mg by mouth daily.     propylthiouracil 50 MG tablet  Commonly known as:  PTU  Take 1 tablet (50 mg total) by mouth 2 (two) times daily.     traZODone 50 MG tablet  Commonly known as:  DESYREL     venlafaxine 100 MG tablet  Commonly known as:  EFFEXOR            No past medical history on file.  Past Surgical History  Procedure Laterality Date  . Knee arthroscopy    . Cholecystectomy    . Appendectomy    . Tubal ligation    . Right foot      Family History  Problem Relation Age of Onset  . Thyroid disease Sister     Social History:  reports that she has been smoking Cigarettes.  She does not have any smokeless tobacco history on file. She reports that she does not drink alcohol. Her drug history is not on file.  Allergies:  Allergies  Allergen Reactions  . Codeine Other (See Comments)    Fainting.    Review of Systems:   Nausea present at times   She has had  depression for 15 years, recently better since Effexor was increased  She has not had any regular menstrual cycles recently since she was given Mirena   Examination:   BP 118/84 mmHg  Pulse 95  Temp(Src) 97.7 F (36.5 C)  Resp 16  Ht  (1.676 m)  Wt 202 lb 6.4 oz (91.808 kg)  BMI 32.68 kg/m2  SpO2 97%  She looks well, not anxious  Eyes: No prominence   No swelling of the eyelids  Neck: The thyroid is nonpalpable  Neurological: Deep tendon reflexes at biceps are normal    Assessment/Plan:   Hyperthyroidism  She probably has mild hyperthyroidism with high normal free T3 level and low TSH Subjectively she is somewhat better with starting low-dose PTU Also her T3 level is improved; however TSH is still suppressed  Not clear why she is having fatigue recently as the T3 level is better Since she is having some nausea with PTU will change her to 5 mg methimazole for convenience  Follow-up in 3 weeks    Nichole Gonzalez 03/21/2015, 4:10 PM

## 2015-03-22 ENCOUNTER — Encounter: Payer: Self-pay | Admitting: Endocrinology

## 2015-04-06 ENCOUNTER — Other Ambulatory Visit: Payer: BLUE CROSS/BLUE SHIELD

## 2015-04-11 ENCOUNTER — Ambulatory Visit: Payer: BLUE CROSS/BLUE SHIELD | Admitting: Endocrinology

## 2015-04-11 ENCOUNTER — Telehealth: Payer: Self-pay | Admitting: Endocrinology

## 2015-04-11 ENCOUNTER — Encounter: Payer: Self-pay | Admitting: *Deleted

## 2015-04-11 NOTE — Telephone Encounter (Signed)
Letter mailed

## 2015-04-11 NOTE — Telephone Encounter (Signed)
Patient no showed today's appt. Please advise on how to follow up. °A. No follow up necessary. °B. Follow up urgent. Contact patient immediately. °C. Follow up necessary. Contact patient and schedule visit in ___ days. °D. Follow up advised. Contact patient and schedule visit in ____weeks. ° °

## 2015-04-18 ENCOUNTER — Other Ambulatory Visit (INDEPENDENT_AMBULATORY_CARE_PROVIDER_SITE_OTHER): Payer: BLUE CROSS/BLUE SHIELD

## 2015-04-18 ENCOUNTER — Encounter: Payer: Self-pay | Admitting: Endocrinology

## 2015-04-18 DIAGNOSIS — E0590 Thyrotoxicosis, unspecified without thyrotoxic crisis or storm: Secondary | ICD-10-CM | POA: Diagnosis not present

## 2015-04-18 LAB — TSH: TSH: 1.71 u[IU]/mL (ref 0.35–4.50)

## 2015-04-18 LAB — T3, FREE: T3 FREE: 2.9 pg/mL (ref 2.3–4.2)

## 2015-04-19 LAB — T4, FREE: Free T4: 0.61 ng/dL (ref 0.60–1.60)

## 2015-04-21 ENCOUNTER — Ambulatory Visit (INDEPENDENT_AMBULATORY_CARE_PROVIDER_SITE_OTHER): Payer: BLUE CROSS/BLUE SHIELD | Admitting: Endocrinology

## 2015-04-21 ENCOUNTER — Encounter: Payer: Self-pay | Admitting: Endocrinology

## 2015-04-21 VITALS — BP 124/86 | HR 75 | Temp 97.8°F | Resp 16 | Ht 66.0 in | Wt 204.0 lb

## 2015-04-21 DIAGNOSIS — E0590 Thyrotoxicosis, unspecified without thyrotoxic crisis or storm: Secondary | ICD-10-CM | POA: Diagnosis not present

## 2015-04-21 NOTE — Progress Notes (Signed)
Patient ID: Nichole RibasKatie S Gonzalez, female   DOB: [birth year], 39 y.o.   MRN: [id number]                                                                                                                Reason for Appointment:  Hyperthyroidism, follow-up visit    Referring physician: [facility]   History of Present Illness:   Background information: She had been complaining of feeling excessively tired for several months, probably a year or more. She says she also has had difficulty with early and late insomnia for quite some time Also for 4 weeks prior to her visit she had periodically felt her heart racing; also felt anxious at times; she may feel her hands or legs being shaky especially her legs when she is driving. She does not have heat intolerance but at times will feel warm but not sweaty. She has had difficulty with progressive weight gain over the last 1-2 years and has gained as much as 50 pounds     The patient was evaluated with thyroid function tests because of her fatigue which showed that the TSH was low at 0.19. Her TSH was normal in 05/2014 Her free T4 was normal but her free T3 was upper normal at 4.1  She was started on low-dose PTU, 50 mg twice a day She says that with starting this she felt less tired and had no further palpitations Because of her having some nausea this was changed to methimazole 5 mg daily in 3/16 With this her nausea is less  She thinks her fatigue is somewhat better but she still having continued fatigue, does not feel sleepy or cold Still having issues with some weight gain   Wt Readings from Last 3 Encounters:  04/21/15 204 lb (92.534 kg)  03/21/15 202 lb 6.4 oz (91.808 kg)  02/17/15 115 lb (52.164 kg)    Her thyroid levels show free T3 to be further decreased and her TSH is now normal along with low normal free T4   Lab Results  Component Value Date   TSH 1.71 04/18/2015   TSH 0.07* 03/17/2015   TSH 0.24* 02/17/2015   FREET4 0.61 04/18/2015   FREET4 1.03 02/17/2015       Medication List       This list is accurate as of: 04/21/15  3:04 PM.  Always use your most recent med list.               BENTYL 10 MG capsule  Generic drug:  dicyclomine  Take 10 mg by mouth daily. Can take it twice a day if needed     CHANTIX STARTING MONTH PAK 0.5 MG X 11 & 1 MG X 42 tablet  Generic drug:  varenicline     diphenhydrAMINE 25 mg capsule  Commonly known as:  BENADRYL  Take 25 mg by mouth every 6 (six) hours as needed.     EXCEDRIN EXTRA STRENGTH 250-250-65 MG per tablet  Generic drug:  aspirin-acetaminophen-caffeine  Take 2 tablets  by mouth every 6 (six) hours as needed for headache (and pain).     ibuprofen 800 MG tablet  Commonly known as:  ADVIL,MOTRIN  Take 1 tablet (800 mg total) by mouth 3 (three) times daily.     loratadine 10 MG tablet  Commonly known as:  CLARITIN  Take 10 mg by mouth daily.     meloxicam 7.5 MG tablet  Commonly known as:  MOBIC     methimazole 5 MG tablet  Commonly known as:  TAPAZOLE  Take 1 tablet (5 mg total) by mouth daily.     montelukast 10 MG tablet  Commonly known as:  SINGULAIR  Take 10 mg by mouth daily with breakfast.     multivitamin with minerals Tabs tablet  Take 1 tablet by mouth daily.     omeprazole 40 MG capsule  Commonly known as:  PRILOSEC  Take 40 mg by mouth daily.     pyridoxine 200 MG tablet  Commonly known as:  B-6  Take 200 mg by mouth daily.     traZODone 50 MG tablet  Commonly known as:  DESYREL     venlafaxine 100 MG tablet  Commonly known as:  EFFEXOR            No past medical history on file.  Past Surgical History  Procedure Laterality Date  . Knee arthroscopy    . Cholecystectomy    . Appendectomy    . Tubal ligation    . Right foot      Family History  Problem Relation Age of Onset  . Thyroid disease Sister     Social History:  reports that she has been smoking Cigarettes.  She does not have any smokeless tobacco history on  file. She reports that she does not drink alcohol. Her drug history is not on file.  Allergies:  Allergies  Allergen Reactions  . Codeine Other (See Comments)    Fainting.    Review of Systems:    She has had depression for 15 years  No history of hypertension   Examination:   BP 124/86 mmHg  Pulse 75  Temp(Src) 97.8 F (36.6 C)  Resp 16  Ht  (1.676 m)  Wt 204 lb (92.534 kg)  BMI 32.94 kg/m2  SpO2 97%  She looks well  Eyes: No puffiness  Neck: The thyroid is nonpalpable  Neurological: Deep tendon reflexes at biceps are normal    Assessment/Plan:   Hyperthyroidism  She  has mild hyperthyroidism with high normal free T3 level and low TSH  Her levels are now well controlled with 5 mg methimazole and she is tolerating this better than PTU  Since her TSH has come back to normal and her free T4 is low normal she may well be getting into remission Will reduce her methimazole to 5 days a week for now and plan on stopping it on the next visit Discussed that she needs to call us if she has unusual fatigue before then  Follow-up in  6 weeks   [name] 04/21/2015, 3:04 PM

## 2015-04-21 NOTE — Patient Instructions (Signed)
Skip dose on Friday and Tuesdays

## 2015-05-30 ENCOUNTER — Other Ambulatory Visit (INDEPENDENT_AMBULATORY_CARE_PROVIDER_SITE_OTHER): Payer: BLUE CROSS/BLUE SHIELD

## 2015-05-30 DIAGNOSIS — E0590 Thyrotoxicosis, unspecified without thyrotoxic crisis or storm: Secondary | ICD-10-CM

## 2015-05-30 LAB — T4, FREE: FREE T4: 0.83 ng/dL (ref 0.60–1.60)

## 2015-05-30 LAB — TSH: TSH: 1.2 u[IU]/mL (ref 0.35–4.50)

## 2015-06-02 ENCOUNTER — Ambulatory Visit (INDEPENDENT_AMBULATORY_CARE_PROVIDER_SITE_OTHER): Payer: BLUE CROSS/BLUE SHIELD | Admitting: Endocrinology

## 2015-06-02 ENCOUNTER — Encounter: Payer: Self-pay | Admitting: Endocrinology

## 2015-06-02 VITALS — BP 128/88 | HR 84 | Temp 97.7°F | Resp 16 | Ht 66.0 in | Wt 202.8 lb

## 2015-06-02 DIAGNOSIS — E0590 Thyrotoxicosis, unspecified without thyrotoxic crisis or storm: Secondary | ICD-10-CM | POA: Diagnosis not present

## 2015-06-02 NOTE — Progress Notes (Signed)
Patient ID: Nichole Gonzalez, female   DOB: 1976-04-04, 39 y.o.   MRN: [id number]                                                                                                                Reason for Appointment:  Hyperthyroidism, follow-up visit    Referring physician: [facility]   History of Present Illness:   Background information: She had been complaining of feeling excessively tired for several months, probably a year or more. She says she also has had difficulty with early and late insomnia for quite some time Also for 4 weeks prior to her visit she had periodically felt her heart racing; also felt anxious at times; she may feel her hands or legs being shaky especially her legs when she is driving. She does not have heat intolerance but at times will feel warm but not sweaty. She has had difficulty with progressive weight gain over the last 1-2 years and has gained as much as 50 pounds     The patient was evaluated with thyroid function tests because of her fatigue which showed that the TSH was low at 0.19. Her TSH was normal in 05/2014 Her free T4 was normal but her free T3 was upper normal at 4.1  She was started on low-dose PTU, 50 mg twice a day She says that with starting this she felt less tired and had no further palpitations Because of her having some nausea this was changed to methimazole 5 mg daily in 3/16   In 4/16 her free T4 was low normal and she has been taking methimazole 5 mg, 5 days a week now She still has some fatigue but not any worse.  Overall has been better since treatment of hyperthyroidism Weight is stable Feels warm only occasionally   Wt Readings from Last 3 Encounters:  06/02/15 202 lb 12.8 oz (91.989 kg)  04/21/15 204 lb (92.534 kg)  03/21/15 202 lb 6.4 oz (91.808 kg)    Her thyroid levels showTSH is still normal along with  normal free T4   Lab Results  Component Value Date   TSH 1.20 05/30/2015   TSH 1.71 04/18/2015   TSH 0.07* 03/17/2015     FREET4 0.83 05/30/2015   FREET4 0.61 04/18/2015   FREET4 1.03 02/17/2015       Medication List       This list is accurate as of: 06/02/15  3:13 PM.  Always use your most recent med list.               BENTYL 10 MG capsule  Generic drug:  dicyclomine  Take 10 mg by mouth daily. Can take it twice a day if needed     CHANTIX STARTING MONTH PAK 0.5 MG X 11 & 1 MG X 42 tablet  Generic drug:  varenicline     diphenhydrAMINE 25 mg capsule  Commonly known as:  BENADRYL  Take 25 mg by mouth every 6 (six) hours as needed.     EPIPEN  2-PAK 0.3 mg/0.3 mL Soaj injection  Generic drug:  EPINEPHrine     EXCEDRIN EXTRA STRENGTH 250-250-65 MG per tablet  Generic drug:  aspirin-acetaminophen-caffeine  Take 2 tablets by mouth every 6 (six) hours as needed for headache (and pain).     ibuprofen 800 MG tablet  Commonly known as:  ADVIL,MOTRIN  Take 1 tablet (800 mg total) by mouth 3 (three) times daily.     loratadine 10 MG tablet  Commonly known as:  CLARITIN  Take 10 mg by mouth daily.     meloxicam 7.5 MG tablet  Commonly known as:  MOBIC     methimazole 5 MG tablet  Commonly known as:  TAPAZOLE  Take 1 tablet (5 mg total) by mouth daily.     montelukast 10 MG tablet  Commonly known as:  SINGULAIR  Take 10 mg by mouth daily with breakfast.     multivitamin with minerals Tabs tablet  Take 1 tablet by mouth daily.     omeprazole 40 MG capsule  Commonly known as:  PRILOSEC  Take 40 mg by mouth daily.     ondansetron 4 MG tablet  Commonly known as:  ZOFRAN     PROAIR RESPICLICK 108 (90 BASE) MCG/ACT Aepb  Generic drug:  Albuterol Sulfate     pyridoxine 200 MG tablet  Commonly known as:  B-6  Take 200 mg by mouth daily.     traZODone 50 MG tablet  Commonly known as:  DESYREL     venlafaxine 100 MG tablet  Commonly known as:  EFFEXOR            No past medical history on file.  Past Surgical History  Procedure Laterality Date  . Knee arthroscopy    .  Cholecystectomy    . Appendectomy    . Tubal ligation    . Right foot      Family History  Problem Relation Age of Onset  . Thyroid disease Sister     Social History:  reports that she has been smoking Cigarettes.  She does not have any smokeless tobacco history on file. She reports that she does not drink alcohol. Her drug history is not on file.  Allergies:  Allergies  Allergen Reactions  . Codeine Other (See Comments)    Fainting.    Review of Systems:   She has had some dry eyes but no excessive tearing  She has had depression for 15 years  No history of hypertension   Examination:   BP 128/88 mmHg  Pulse 84  Temp(Src) 97.7 F (36.5 C)  Resp 16  Ht 5\' 6"  (1.676 m)  Wt 202 lb 12.8 oz (91.989 kg)  BMI 32.75 kg/m2  SpO2 98%  She looks well  Eyes: No puffiness  Neck: The thyroid is nonpalpable  Neurological: Deep tendon reflexes at biceps are normal    Assessment/Plan:   Hyperthyroidism  She  has mild hyperthyroidism with high normal free T3 level and low TSH  Her levels are now well controlled with 5 mg methimazole, 5 days a week  Since her TSH has been persistently normal and her free T4 is excellent with taking a relatively low dose she is likely to be in remission now  She will try to stop her methimazole and call if she has any recurrence of hypothyroid symptoms Follow-up in  6 weeks for thyroid levels and visit  Dry eyes: Not clear if this may be partly related to Graves' disease and she  can try taking OTC artificial tears   Myran Arcia 06/02/2015, 3:13 PM

## 2015-06-02 NOTE — Patient Instructions (Signed)
Leave off Methimazole

## 2015-07-11 ENCOUNTER — Other Ambulatory Visit: Payer: BLUE CROSS/BLUE SHIELD

## 2015-07-14 ENCOUNTER — Ambulatory Visit: Payer: BLUE CROSS/BLUE SHIELD | Admitting: Endocrinology

## 2015-08-05 ENCOUNTER — Other Ambulatory Visit: Payer: BLUE CROSS/BLUE SHIELD

## 2015-08-05 ENCOUNTER — Other Ambulatory Visit (INDEPENDENT_AMBULATORY_CARE_PROVIDER_SITE_OTHER): Payer: BLUE CROSS/BLUE SHIELD

## 2015-08-05 DIAGNOSIS — E0590 Thyrotoxicosis, unspecified without thyrotoxic crisis or storm: Secondary | ICD-10-CM

## 2015-08-05 LAB — T3, FREE: T3 FREE: 2.8 pg/mL (ref 2.3–4.2)

## 2015-08-05 LAB — T4, FREE: FREE T4: 0.71 ng/dL (ref 0.60–1.60)

## 2015-08-05 LAB — TSH: TSH: 1.08 u[IU]/mL (ref 0.35–4.50)

## 2015-08-10 ENCOUNTER — Ambulatory Visit: Payer: BLUE CROSS/BLUE SHIELD | Admitting: Endocrinology

## 2015-08-11 ENCOUNTER — Ambulatory Visit: Payer: BLUE CROSS/BLUE SHIELD | Admitting: Endocrinology

## 2015-08-12 ENCOUNTER — Ambulatory Visit (INDEPENDENT_AMBULATORY_CARE_PROVIDER_SITE_OTHER): Payer: BLUE CROSS/BLUE SHIELD | Admitting: Endocrinology

## 2015-08-12 ENCOUNTER — Encounter: Payer: Self-pay | Admitting: Endocrinology

## 2015-08-12 VITALS — BP 138/98 | HR 85 | Temp 97.6°F | Resp 16 | Ht 66.0 in | Wt 201.0 lb

## 2015-08-12 DIAGNOSIS — E0590 Thyrotoxicosis, unspecified without thyrotoxic crisis or storm: Secondary | ICD-10-CM

## 2015-08-12 NOTE — Progress Notes (Signed)
Patient ID: Nichole Gonzalez, female   DOB: [birth year], 39 y.o.   MRN: [id number]                                                                                                                Reason for Appointment:  Hyperthyroidism, follow-up visit    Referring physician: [facility]   History of Present Illness:   Background information: She had been complaining of feeling excessively tired for several months, probably a year or more. She says she also has had difficulty with early and late insomnia for quite some time Also for 4 weeks prior to her visit she had periodically felt her heart racing; also felt anxious at times; she may feel her hands or legs being shaky especially her legs when she is driving. She does not have heat intolerance but at times will feel warm but not sweaty. She has had difficulty with progressive weight gain over the last 1-2 years and has gained as much as 50 pounds     The patient was evaluated with thyroid function tests because of her fatigue which showed that the TSH was low at 0.19. Her TSH was normal in 05/2014 Her free T4 was normal but her free T3 was upper normal at 4.1  She was started on low-dose PTU, 50 mg twice a day with which she felt less tired and had no further palpitations Because of her having some nausea this was changed to methimazole 5 mg daily in 3/16 She  had been feeling better since treatment of hyperthyroidism   This was reduced in 4/16 and stopped on her last visit in 05/2015   Since her last visit she  feels fairly good without any unusual fatigue and has no symptoms of palpitations or shakiness   Weight is stable   Wt Readings from Last 3 Encounters:  08/12/15 201 lb (91.173 kg)  06/02/15 202 lb 12.8 oz (91.989 kg)  04/21/15 204 lb (92.534 kg)    Her thyroid levels showTSH is still normal along with normal free T4  And free T3   Lab Results  Component Value Date   TSH 1.08 08/05/2015   TSH 1.20 05/30/2015   TSH 1.71  04/18/2015   FREET4 0.71 08/05/2015   FREET4 0.83 05/30/2015   FREET4 0.61 04/18/2015       Medication List       This list is accurate as of: 08/12/15  4:50 PM.  Always use your most recent med list.               ALLERGY RELIEF-D PO  Take by mouth.     amoxicillin 500 MG capsule  Commonly known as:  AMOXIL     BENTYL 10 MG capsule  Generic drug:  dicyclomine  Take 10 mg by mouth daily. Can take it twice a day if needed     CHANTIX STARTING MONTH PAK 0.5 MG X 11 & 1 MG X 42 tablet  Generic drug:  varenicline     diphenhydrAMINE  25 mg capsule  Commonly known as:  BENADRYL  Take 25 mg by mouth every 6 (six) hours as needed.     EPIPEN 2-PAK 0.3 mg/0.3 mL Soaj injection  Generic drug:  EPINEPHrine     EXCEDRIN EXTRA STRENGTH 250-250-65 MG per tablet  Generic drug:  aspirin-acetaminophen-caffeine  Take 2 tablets by mouth every 6 (six) hours as needed for headache (and pain).     ibuprofen 800 MG tablet  Commonly known as:  ADVIL,MOTRIN  Take 1 tablet (800 mg total) by mouth 3 (three) times daily.     loratadine 10 MG tablet  Commonly known as:  CLARITIN  Take 10 mg by mouth daily.     meloxicam 7.5 MG tablet  Commonly known as:  MOBIC     methimazole 5 MG tablet  Commonly known as:  TAPAZOLE  Take 1 tablet (5 mg total) by mouth daily.     montelukast 10 MG tablet  Commonly known as:  SINGULAIR  Take 10 mg by mouth daily with breakfast.     multivitamin with minerals Tabs tablet  Take 1 tablet by mouth daily.     omeprazole 40 MG capsule  Commonly known as:  PRILOSEC  Take 40 mg by mouth daily.     ondansetron 4 MG tablet  Commonly known as:  ZOFRAN     PROAIR RESPICLICK 108 (90 BASE) MCG/ACT Aepb  Generic drug:  Albuterol Sulfate     pyridoxine 200 MG tablet  Commonly known as:  B-6  Take 200 mg by mouth daily.     traZODone 50 MG tablet  Commonly known as:  DESYREL     venlafaxine 100 MG tablet  Commonly known as:  EFFEXOR             No past medical history on file.  Past Surgical History  Procedure Laterality Date  . Knee arthroscopy    . Cholecystectomy    . Appendectomy    . Tubal ligation    . Right foot      Family History  Problem Relation Age of Onset  . Thyroid disease Sister     Social History:  reports that she has been smoking Cigarettes.  She does not have any smokeless tobacco history on file. She reports that she does not drink alcohol. Her drug history is not on file.  Allergies:  Allergies  Allergen Reactions  . Codeine Other (See Comments)    Fainting.    Review of Systems:   She has had some dry eyes but no excessive tearing  She has had depression for 15 years  No history of hypertension   Examination:   BP 138/98 mmHg  Pulse 85  Temp(Src) 97.6 F (36.4 C)  Resp 16  Ht 5\' 6"  (1.676 m)  Wt 201 lb (91.173 kg)  BMI 32.46 kg/m2  SpO2 96%   repeat blood pressure 124/80  She looks well  Eyes: No puffiness  Neck: The thyroid is non-palpable  Neurological: Deep tendon reflexes at biceps are normal , no tremor    Assessment/Plan:   Hyperthyroidism  She had a history of mild hyperthyroidism with high normal free T3 level and low TSH  After short course of treatment with methimazole she appears to have remission from her hyperthyroidism as she has not had any recurrence of her symptoms or change in her thyroid levels after about 2 months of not taking her methimazole     Her thyroid is not palpable also  She will continue to be of antithyroid drugs and be seen as needed.  She will call us if she has any recurrence of original thyroid symptoms  Jadah Bobak 08/12/2015, 4:50 PM

## 2015-09-03 DIAGNOSIS — Z91038 Other insect allergy status: Secondary | ICD-10-CM

## 2015-09-03 DIAGNOSIS — R0600 Dyspnea, unspecified: Secondary | ICD-10-CM | POA: Insufficient documentation

## 2015-09-03 DIAGNOSIS — R062 Wheezing: Secondary | ICD-10-CM | POA: Insufficient documentation

## 2015-09-03 DIAGNOSIS — J309 Allergic rhinitis, unspecified: Secondary | ICD-10-CM | POA: Insufficient documentation

## 2015-09-03 DIAGNOSIS — Z91030 Bee allergy status: Secondary | ICD-10-CM

## 2015-09-03 DIAGNOSIS — Z720 Tobacco use: Secondary | ICD-10-CM

## 2015-09-03 HISTORY — DX: Allergic rhinitis, unspecified: J30.9

## 2015-09-03 HISTORY — DX: Other insect allergy status: Z91.038

## 2015-09-03 HISTORY — DX: Tobacco use: Z72.0

## 2016-01-18 ENCOUNTER — Encounter (HOSPITAL_COMMUNITY): Payer: Self-pay

## 2016-01-18 ENCOUNTER — Emergency Department (HOSPITAL_COMMUNITY): Payer: BLUE CROSS/BLUE SHIELD

## 2016-01-18 ENCOUNTER — Emergency Department (HOSPITAL_COMMUNITY)
Admission: EM | Admit: 2016-01-18 | Discharge: 2016-01-18 | Disposition: A | Discharge status: Home / Self Care | Payer: BLUE CROSS/BLUE SHIELD | Attending: Emergency Medicine | Admitting: Emergency Medicine

## 2016-01-18 DIAGNOSIS — R0602 Shortness of breath: Secondary | ICD-10-CM | POA: Diagnosis not present

## 2016-01-18 DIAGNOSIS — Z3202 Encounter for pregnancy test, result negative: Secondary | ICD-10-CM | POA: Insufficient documentation

## 2016-01-18 DIAGNOSIS — F17210 Nicotine dependence, cigarettes, uncomplicated: Secondary | ICD-10-CM | POA: Insufficient documentation

## 2016-01-18 DIAGNOSIS — F419 Anxiety disorder, unspecified: Secondary | ICD-10-CM | POA: Insufficient documentation

## 2016-01-18 DIAGNOSIS — Z79899 Other long term (current) drug therapy: Secondary | ICD-10-CM | POA: Insufficient documentation

## 2016-01-18 DIAGNOSIS — Z8639 Personal history of other endocrine, nutritional and metabolic disease: Secondary | ICD-10-CM | POA: Diagnosis not present

## 2016-01-18 DIAGNOSIS — R110 Nausea: Secondary | ICD-10-CM | POA: Insufficient documentation

## 2016-01-18 DIAGNOSIS — R079 Chest pain, unspecified: Secondary | ICD-10-CM | POA: Insufficient documentation

## 2016-01-18 DIAGNOSIS — R0600 Dyspnea, unspecified: Secondary | ICD-10-CM | POA: Insufficient documentation

## 2016-01-18 LAB — CBC
HCT: 43.7 % (ref 36.0–46.0)
Hemoglobin: 14.5 g/dL (ref 12.0–15.0)
MCH: 31.6 pg (ref 26.0–34.0)
MCHC: 33.2 g/dL (ref 30.0–36.0)
MCV: 95.2 fL (ref 78.0–100.0)
PLATELETS: 320 10*3/uL (ref 150–400)
RBC: 4.59 MIL/uL (ref 3.87–5.11)
RDW: 12.6 % (ref 11.5–15.5)
WBC: 16.7 10*3/uL — ABNORMAL HIGH (ref 4.0–10.5)

## 2016-01-18 LAB — BASIC METABOLIC PANEL
Anion gap: 10 (ref 5–15)
CHLORIDE: 108 mmol/L (ref 101–111)
CO2: 25 mmol/L (ref 22–32)
CREATININE: 0.76 mg/dL (ref 0.44–1.00)
Calcium: 9.5 mg/dL (ref 8.9–10.3)
GFR calc Af Amer: >60 mL/min (ref >60)
GFR calc non Af Amer: >60 mL/min (ref >60)
GLUCOSE: 94 mg/dL (ref 65–99)
Potassium: 3.4 mmol/L — ABNORMAL LOW (ref 3.5–5.1)
SODIUM: 143 mmol/L (ref 135–145)

## 2016-01-18 LAB — I-STAT TROPONIN, ED: Troponin i, poc: 0 ng/mL (ref 0.00–0.08)

## 2016-01-18 LAB — D-DIMER, QUANTITATIVE: D-Dimer, Quant: <0.27 ug/mL-FEU (ref 0.00–0.50)

## 2016-01-18 LAB — I-STAT BETA HCG BLOOD, ED (MC, WL, AP ONLY): I-stat hCG, quantitative: <5 m[IU]/mL (ref <5)

## 2016-01-18 MED ORDER — ASPIRIN 81 MG PO CHEW
324.0000 mg | CHEWABLE_TABLET | Freq: Once | ORAL | Status: AC
  Administered 2016-01-18: 324 mg via ORAL
  Filled 2016-01-18: qty 4

## 2016-01-18 NOTE — ED Notes (Signed)
She c/o sudden onset of chest pain/pressure ~20-30 min. P.t.a. And persists.  Her skin is normal, warm and dry and she is breathing normally.  EKG performed at triage.

## 2016-01-18 NOTE — ED Provider Notes (Signed)
CSN: [id number]     Arrival date & time 01/18/16  0910 History   First MD Initiated Contact with Patient 01/18/16 0919     Chief Complaint  Patient presents with  . Chest Pain     (Consider location/radiation/quality/duration/timing/severity/associated sxs/prior Treatment) HPI Comments: 40 year old female with past medical history including hyperthyroidism and tobacco use who presents with chest pain. Patient states that approximately 30 minutes prior to arrival, she had a sudden onset of central, nonradiating, severe chest pain associated with shortness of breath. She has chronic nausea and denies any vomiting. No diaphoresis. No recent illness, cough/cold symptoms, or fevers. No personal or family history of blood clots, no OCP use, no recent travel.  History notable for father who died of heart disease in his 30s.  Patient is a 40 y.o. female presenting with chest pain. The history is provided by the patient.  Chest Pain   History reviewed. No pertinent past medical history. Past Surgical History  Procedure Laterality Date  . Knee arthroscopy    . Cholecystectomy    . Appendectomy    . Tubal ligation    . Right foot     Family History  Problem Relation Age of Onset  . Thyroid disease Sister    Social History  Substance Use Topics  . Smoking status: Current Every Day Smoker    Types: Cigarettes  . Smokeless tobacco: None  . Alcohol Use: No   OB History    No data available     Review of Systems  Cardiovascular: Positive for chest pain.   10 Systems reviewed and are negative for acute change except as noted in the HPI.    Allergies  Codeine  Home Medications   Prior to Admission medications   Medication Sig Start Date End Date Taking? Authorizing Provider  aspirin-acetaminophen-caffeine (EXCEDRIN EXTRA STRENGTH) [phone] MG per tablet Take 2 tablets by mouth every 6 (six) hours as needed for headache (and pain).   Yes Historical Provider, MD  caffeine 200  MG TABS tablet Take 200 mg by mouth daily as needed (alertness).   Yes Historical Provider, MD  cyclobenzaprine (FLEXERIL) 10 MG tablet Take 10 mg by mouth 3 (three) times daily as needed for muscle spasms.  01/16/16  Yes Historical Provider, MD  dicyclomine (BENTYL) 20 MG tablet Take 20 mg by mouth 2 (two) times daily. 01/07/16  Yes Historical Provider, MD  EPIPEN 2-PAK 0.3 MG/0.3ML SOAJ injection  05/16/15  Yes Historical Provider, MD  ibuprofen (ADVIL,MOTRIN) 200 MG tablet Take 1,200 mg by mouth 2 (two) times daily as needed for headache or moderate pain.   Yes Historical Provider, MD  Loratadine-Pseudoephedrine (ALLERGY RELIEF-D PO) Take 1 tablet by mouth daily as needed (allergies).    Yes Historical Provider, MD  montelukast (SINGULAIR) 10 MG tablet Take 10 mg by mouth daily with breakfast.   Yes Historical Provider, MD  omeprazole (PRILOSEC) 40 MG capsule Take 40 mg by mouth daily.   Yes Historical Provider, MD  ondansetron (ZOFRAN) 4 MG tablet Take 4 mg by mouth daily as needed for nausea or vomiting.  04/26/15  Yes Historical Provider, MD  PROAIR RESPICLICK 108 (90 BASE) MCG/ACT AEPB Inhale 2 puffs into the lungs 2 (two) times daily as needed (shortness of breath).  05/26/15  Yes Historical Provider, MD  traZODone (DESYREL) 100 MG tablet Take 100 mg by mouth at bedtime.   Yes Historical Provider, MD  venlafaxine (EFFEXOR) 100 MG tablet Take 100 mg by mouth daily.  02/04/15  Yes Historical Provider, MD   BP 120/71 mmHg  Pulse 74  Temp(Src) 98.5 F (36.9 C) (Oral)  Resp 12  SpO2 99%  LMP 01/04/2016 (Approximate) Physical Exam  Constitutional: She is oriented to person, place, and time. She appears well-developed and well-nourished.  Anxious, sitting forward, mildly dyspneic  HENT:  Head: Normocephalic and atraumatic.  Moist mucous membranes  Eyes: Conjunctivae are normal. Pupils are equal, round, and reactive to light.  Neck: Neck supple.  Cardiovascular: Regular rhythm and normal heart  sounds.  Tachycardia present.   No murmur heard. Pulmonary/Chest: Breath sounds normal. No respiratory distress. She has no wheezes.  Mildly increased WOB w/ dyspnea  Abdominal: Soft. Bowel sounds are normal. She exhibits no distension. There is no tenderness.  Musculoskeletal: She exhibits no edema.  Neurological: She is alert and oriented to person, place, and time.  Fluent speech  Skin: Skin is warm and dry.  Psychiatric: Judgment normal.  anxious  Nursing note and vitals reviewed.   ED Course  Procedures (including critical care time) Labs Review Labs Reviewed  BASIC METABOLIC PANEL - Abnormal; Notable for the following:    Potassium 3.4 (*)    BUN <5 (*)    All other components within normal limits  CBC - Abnormal; Notable for the following:    WBC 16.7 (*)    All other components within normal limits  D-DIMER, QUANTITATIVE (NOT AT [facility])  I-STAT TROPOININ, ED  I-STAT BETA HCG BLOOD, ED ([facility], [facility], [facility] ONLY)  I-STAT TROPOININ, ED    Imaging Review Dg Chest 2 View  01/18/2016  CLINICAL DATA:  Sudden onset of chest pain and pressure 20-30 minutes prior to presentation, smoker EXAM: CHEST  2 VIEW COMPARISON:  07/26/2005 FINDINGS: Normal heart size, mediastinal contours, and pulmonary vascularity. Minimal chronic central peribronchial thickening. Lungs clear. No pleural effusion or pneumothorax. Bones unremarkable. IMPRESSION: Minimal chronic bronchitic changes without infiltrate. Electronically Signed   By: Ulyses Southward M.D.   On: 01/18/2016 09:34   I have personally reviewed and evaluated these lab results as part of my medical decision-making.   EKG Interpretation   Date/Time:  Wednesday January 18 2016 09:18:34 EST Ventricular Rate:  109 PR Interval:  171 QRS Duration: 102 QT Interval:  315 QTC Calculation: 424 R Axis:   41 Text Interpretation:  Sinus tachycardia RSR' in V1 or V2, probably normal  variant Nonspecific T abnormalities, anterior leads T wave inversions   V2-V3, no previous available Confirmed by Tressie Ragin MD, Issabela Lesko [phone]) on  01/18/2016 1:13:08 PM     Medications  aspirin chewable tablet 324 mg (324 mg Oral Given 01/18/16 0947)    MDM   Final diagnoses:  Chest pain, unspecified chest pain type   Patient with sudden onset of central chest pain associated with shortness of breath that began just prior to arrival. She was anxious and mildly dyspneic but in no acute distress. Vital signs notable for BP 145/108, pulse 106, normal O2 sat. No abnormal lung sounds on exam. EKG shows sinus tachycardia without ischemic changes. Gave the patient aspirin and obtained above lab work including troponin as well as d-dimer because of the patient's tachycardia.  Chest x-ray unremarkable. Labs show normal d-dimer and negative serial troponins. CBC 16.7 however patient denies any fevers or other infectious symptoms. Reexamination, she is comfortable with normal vital signs and her tachycardia has resolved. Her HEART score is less than 3 therefore I feel she is safe for discharge home. I have discussed importance of follow-up  with PCP to arrange for outpatient stress test if her symptoms continue. Return precautions reviewed. Patient voiced understanding and was discharged in satisfactory condition.   Laurence Spates, MD 01/18/16 [phone]

## 2016-11-08 ENCOUNTER — Emergency Department (HOSPITAL_COMMUNITY)
Admission: EM | Admit: 2016-11-08 | Discharge: 2016-11-08 | Disposition: A | Discharge status: Home / Self Care | Payer: BLUE CROSS/BLUE SHIELD | Attending: Emergency Medicine | Admitting: Emergency Medicine

## 2016-11-08 ENCOUNTER — Encounter (HOSPITAL_COMMUNITY): Payer: Self-pay | Admitting: Emergency Medicine

## 2016-11-08 DIAGNOSIS — Z7982 Long term (current) use of aspirin: Secondary | ICD-10-CM | POA: Diagnosis not present

## 2016-11-08 DIAGNOSIS — F17210 Nicotine dependence, cigarettes, uncomplicated: Secondary | ICD-10-CM | POA: Insufficient documentation

## 2016-11-08 DIAGNOSIS — Z5321 Procedure and treatment not carried out due to patient leaving prior to being seen by health care provider: Secondary | ICD-10-CM | POA: Diagnosis not present

## 2016-11-08 DIAGNOSIS — Z79899 Other long term (current) drug therapy: Secondary | ICD-10-CM | POA: Insufficient documentation

## 2016-11-08 DIAGNOSIS — M549 Dorsalgia, unspecified: Secondary | ICD-10-CM | POA: Insufficient documentation

## 2016-11-08 NOTE — ED Triage Notes (Addendum)
Per pt, states right back pain radiating to right abdomin-states she has had a cold-nauseated-patient states pressure with urination

## 2017-05-28 ENCOUNTER — Emergency Department (HOSPITAL_COMMUNITY)
Admission: EM | Admit: 2017-05-28 | Discharge: 2017-05-28 | Disposition: A | Discharge status: Home / Self Care | Payer: BLUE CROSS/BLUE SHIELD | Attending: Emergency Medicine | Admitting: Emergency Medicine

## 2017-05-28 ENCOUNTER — Emergency Department (HOSPITAL_COMMUNITY): Payer: BLUE CROSS/BLUE SHIELD

## 2017-05-28 DIAGNOSIS — F17210 Nicotine dependence, cigarettes, uncomplicated: Secondary | ICD-10-CM | POA: Insufficient documentation

## 2017-05-28 DIAGNOSIS — J0121 Acute recurrent ethmoidal sinusitis: Secondary | ICD-10-CM

## 2017-05-28 DIAGNOSIS — Z7982 Long term (current) use of aspirin: Secondary | ICD-10-CM | POA: Diagnosis not present

## 2017-05-28 DIAGNOSIS — Z79899 Other long term (current) drug therapy: Secondary | ICD-10-CM | POA: Diagnosis not present

## 2017-05-28 DIAGNOSIS — G43109 Migraine with aura, not intractable, without status migrainosus: Secondary | ICD-10-CM | POA: Insufficient documentation

## 2017-05-28 DIAGNOSIS — R51 Headache: Secondary | ICD-10-CM | POA: Diagnosis present

## 2017-05-28 LAB — BASIC METABOLIC PANEL
Anion gap: 9 (ref 5–15)
BUN: 5 mg/dL — AB (ref 6–20)
CALCIUM: 8.8 mg/dL — AB (ref 8.9–10.3)
CO2: 22 mmol/L (ref 22–32)
CREATININE: 0.79 mg/dL (ref 0.44–1.00)
Chloride: 106 mmol/L (ref 101–111)
GFR calc Af Amer: >60 mL/min (ref >60)
GLUCOSE: 107 mg/dL — AB (ref 65–99)
Potassium: 3.6 mmol/L (ref 3.5–5.1)
Sodium: 137 mmol/L (ref 135–145)

## 2017-05-28 LAB — CBC WITH DIFFERENTIAL/PLATELET
Basophils Absolute: 0 10*3/uL (ref 0.0–0.1)
Basophils Relative: 0 %
EOS ABS: 0.1 10*3/uL (ref 0.0–0.7)
EOS PCT: 1 %
HCT: 44.2 % (ref 36.0–46.0)
Hemoglobin: 15 g/dL (ref 12.0–15.0)
LYMPHS PCT: 28 %
Lymphs Abs: 3.7 10*3/uL (ref 0.7–4.0)
MCH: 32.6 pg (ref 26.0–34.0)
MCHC: 33.9 g/dL (ref 30.0–36.0)
MCV: 96.1 fL (ref 78.0–100.0)
MONO ABS: 0.6 10*3/uL (ref 0.1–1.0)
Monocytes Relative: 4 %
Neutro Abs: 8.9 10*3/uL — ABNORMAL HIGH (ref 1.7–7.7)
Neutrophils Relative %: 67 %
PLATELETS: 229 10*3/uL (ref 150–400)
RBC: 4.6 MIL/uL (ref 3.87–5.11)
RDW: 13.1 % (ref 11.5–15.5)
WBC: 13.3 10*3/uL — ABNORMAL HIGH (ref 4.0–10.5)

## 2017-05-28 LAB — I-STAT BETA HCG BLOOD, ED (MC, WL, AP ONLY): I-stat hCG, quantitative: <5 m[IU]/mL (ref <5)

## 2017-05-28 MED ORDER — DIPHENHYDRAMINE HCL 50 MG/ML IJ SOLN
25.0000 mg | Freq: Once | INTRAMUSCULAR | Status: AC
  Administered 2017-05-28: 25 mg via INTRAVENOUS
  Filled 2017-05-28: qty 1

## 2017-05-28 MED ORDER — DEXAMETHASONE SODIUM PHOSPHATE 10 MG/ML IJ SOLN
10.0000 mg | Freq: Once | INTRAMUSCULAR | Status: AC
  Administered 2017-05-28: 10 mg via INTRAVENOUS
  Filled 2017-05-28: qty 1

## 2017-05-28 MED ORDER — AMOXICILLIN-POT CLAVULANATE 875-125 MG PO TABS: 1.0000 | ORAL_TABLET | Freq: Two times a day (BID) | ORAL | 0 refills | Status: AC

## 2017-05-28 MED ORDER — BUTALBITAL-APAP-CAFFEINE 50-325-40 MG PO TABS: 1.0000 | ORAL_TABLET | Freq: Four times a day (QID) | ORAL | 0 refills | Status: AC | PRN

## 2017-05-28 MED ORDER — KETOROLAC TROMETHAMINE 15 MG/ML IJ SOLN
15.0000 mg | Freq: Once | INTRAMUSCULAR | Status: AC
  Administered 2017-05-28: 15 mg via INTRAVENOUS
  Filled 2017-05-28: qty 1

## 2017-05-28 MED ORDER — SODIUM CHLORIDE 0.9 % IV BOLUS (SEPSIS)
1000.0000 mL | Freq: Once | INTRAVENOUS | Status: AC
  Administered 2017-05-28: 1000 mL via INTRAVENOUS

## 2017-05-28 MED ORDER — PROCHLORPERAZINE EDISYLATE 5 MG/ML IJ SOLN
10.0000 mg | Freq: Once | INTRAMUSCULAR | Status: AC
  Administered 2017-05-28: 10 mg via INTRAVENOUS
  Filled 2017-05-28: qty 2

## 2017-05-28 NOTE — ED Notes (Signed)
Pt.walk very well in hallway back to room without any help.gave pt.acoke to drink.

## 2017-05-28 NOTE — ED Provider Notes (Signed)
[facility]-EMERGENCY DEPT Provider Note   CSN: [id number] Arrival date & time: 05/28/17  0706     History   Chief Complaint Chief Complaint  Patient presents with  . Headache    HPI Nichole Gonzalez is a 41 y.o. female.  HPI   41 yo F with PMHx anxiety, IBS, chronic fatigue, migraines here with heaviness sensation and tingling in arms and legs. Pt states she has been under increased stress recently. She was driving in to work today when she began to feel anxious, followed by a subjective "heaviness" feeling on her right sided with "tingling" in her bilateral arms. She also developed blurred, spotty vision during this episode. She was able to drive herself to the ED. Over the past several min, her sx are rapidly improving but she is developing an aching, throbbing headache. HA began gradually but is worsening. She has a h/o migraines. She also has h/o recent stressor of her grandmother dying. No known h/o HTN, HLD, DM, or stroke.  No past medical history on file.  Patient Active Problem List   Diagnosis Date Noted  . 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 Allergic rhinitis 09/03/2015  . Dyspnea 09/03/2015  . Wheezing 09/03/2015  . Tobacco abuse 09/03/2015  . Hyperthyroidism 04/21/2015  . Abnormal thyroid stimulating hormone (TSH) level 02/17/2015  . Chronic fatigue 02/17/2015    Past Surgical History:  Procedure Laterality Date  . ABDOMINAL HYSTERECTOMY    . APPENDECTOMY    . CHOLECYSTECTOMY    . KNEE ARTHROSCOPY    . right foot    . TUBAL LIGATION      OB History    No data available       Home Medications    Prior to Admission medications   Medication Sig Start Date End Date Taking? Authorizing Provider  aspirin EC 81 MG tablet Take 81 mg by mouth daily.   Yes [provider]  caffeine 200 MG TABS tablet Take 200 mg by mouth daily as needed (alertness).   Yes [provider]  dicyclomine (BENTYL) 20 MG tablet Take 20 mg by mouth daily.  01/07/16  Yes  [provider]  Loratadine-Pseudoephedrine (ALLERGY RELIEF-D PO) Take 1 tablet by mouth daily as needed (allergies).    Yes [provider]  montelukast (SINGULAIR) 10 MG tablet Take 10 mg by mouth daily with breakfast.   Yes [provider]  omeprazole (PRILOSEC) 40 MG capsule Take 40 mg by mouth daily.   Yes [provider]  traZODone (DESYREL) 100 MG tablet Take 100 mg by mouth at bedtime.   Yes [provider]  venlafaxine (EFFEXOR) 100 MG tablet Take 100 mg by mouth daily.  02/04/15  Yes [provider]  amoxicillin-clavulanate (AUGMENTIN) 875-125 MG tablet Take 1 tablet by mouth every 12 (twelve) hours. 05/28/17 06/07/17  Shaune Pollack, MD  aspirin-acetaminophen-caffeine (EXCEDRIN EXTRA STRENGTH) [phone] MG per tablet Take 2 tablets by mouth every 6 (six) hours as needed for headache (and pain).    [provider]  butalbital-acetaminophen-caffeine Marikay Alar, ESGIC) [phone] MG tablet Take 1-2 tablets by mouth every 6 (six) hours as needed for headache. 05/28/17 05/28/18  Shaune Pollack, MD  cyclobenzaprine (FLEXERIL) 10 MG tablet Take 10 mg by mouth 3 (three) times daily as needed for muscle spasms.  01/16/16   [provider]  EPIPEN 2-PAK 0.3 MG/0.3ML SOAJ injection  05/16/15   [provider]  ibuprofen (ADVIL,MOTRIN) 200 MG tablet Take 1,200 mg by mouth 2 (two) times daily  as needed for headache or moderate pain.    [provider]  ondansetron (ZOFRAN) 4 MG tablet Take 4 mg by mouth daily as needed for nausea or vomiting.  04/26/15   [provider]  PROAIR RESPICLICK 108 (90 BASE) MCG/ACT AEPB Inhale 2 puffs into the lungs 2 (two) times daily as needed (shortness of breath).  05/26/15   [provider]    Family History Family History  Problem Relation Age of Onset  . Thyroid disease Sister     Social History Social History  Substance Use Topics  . Smoking status: Current Every  Day Smoker    Types: Cigarettes  . Smokeless tobacco: Not on file  . Alcohol use No     Allergies   Codeine   Review of Systems Review of Systems  Constitutional: Positive for fatigue.  Neurological: Positive for weakness and numbness.  Psychiatric/Behavioral: The patient is nervous/anxious.   All other systems reviewed and are negative.    Physical Exam Updated Vital Signs BP 107/84 (BP Location: Right Arm)   Pulse 63   Temp 97.9 F (36.6 C) (Oral)   Resp 17   Ht 5\' 6"  (1.676 m)   Wt 82.6 kg (182 lb)   LMP 01/04/2016 (Approximate)   SpO2 97%   BMI 29.38 kg/m   Physical Exam  Constitutional: She is oriented to person, place, and time. She appears well-developed and well-nourished. No distress.  HENT:  Head: Normocephalic and atraumatic.  Eyes: Conjunctivae are normal.  Neck: Neck supple.  Cardiovascular: Normal rate, regular rhythm and normal heart sounds.  Exam reveals no friction rub.   No murmur heard. Pulmonary/Chest: Effort normal and breath sounds normal. No respiratory distress. She has no wheezes. She has no rales.  Abdominal: She exhibits no distension.  Musculoskeletal: She exhibits no edema.  Neurological: She is alert and oriented to person, place, and time. She exhibits normal muscle tone.  Skin: Skin is warm. Capillary refill takes less than 2 seconds.  Psychiatric: She has a normal mood and affect.  Nursing note and vitals reviewed.   Neurological Exam:  Mental Status: Alert and oriented to person, place, and time. Attention and concentration normal. Speech clear. Recent memory is intact. Cranial Nerves: Visual fields grossly intact. EOMI and PERRLA. No nystagmus noted. Facial sensation intact at forehead, maxillary cheek, and chin/mandible bilaterally. No facial asymmetry or weakness. Hearing grossly normal. Uvula is midline, and palate elevates symmetrically. Normal SCM and trapezius strength. Tongue midline without fasciculations. Motor: Muscle  strength 5/5 in proximal and distal UE and LE bilaterally. No pronator drift. Muscle tone normal. Reflexes: 2+ and symmetrical in all four extremities.  Sensation: Intact to light touch in upper and lower extremities distally bilaterally.  Gait: Normal without ataxia. Coordination: Normal FTN bilaterally.    ED Treatments / Results  Labs (all labs ordered are listed, but only abnormal results are displayed) Labs Reviewed  CBC WITH DIFFERENTIAL/PLATELET - Abnormal; Notable for the following:       Result Value   WBC 13.3 (*)    Neutro Abs 8.9 (*)    All other components within normal limits  BASIC METABOLIC PANEL - Abnormal; Notable for the following:    Glucose, Bld 107 (*)    BUN 5 (*)    Calcium 8.8 (*)    All other components within normal limits  I-STAT BETA HCG BLOOD, ED ([facility], [facility], [facility] ONLY)    EKG  EKG Interpretation None  Radiology Ct Head Wo Contrast  Result Date: 05/28/2017 CLINICAL DATA:  Right eye blurred vision and tingling of fingers on both sides. Right-sided weakness. EXAM: CT HEAD WITHOUT CONTRAST TECHNIQUE: Contiguous axial images were obtained from the base of the skull through the vertex without intravenous contrast. COMPARISON:  Brain MRI March 08, 2005 FINDINGS: Brain: The ventricles are normal in size and configuration. There is no intracranial mass, hemorrhage, extra-axial fluid collection, or midline shift. Gray-white compartments appear normal. No acute infarct evident. Vascular: There is no demonstrable hyperdense vessel. There is no appreciable vascular calcification. Skull: Bony calvarium appears intact. Sinuses/Orbits: There is mucosal thickening and opacification in multiple ethmoid air cells bilaterally. Other visualized paranasal sinuses are clear. There is mild leftward deviation of the nasal septum. Orbits appear symmetric bilaterally. Other: There is opacification in several inferior mastoid air cells on the left. Mastoid air cells on the right  are clear. IMPRESSION: No intracranial mass hemorrhage, or extra-axial fluid collection. Gray-white compartments appear normal. Opacification in multiple ethmoid air cells. Mild left-sided mastoid disease. Leftward deviation of nasal septum. Electronically Signed   By: Bretta BangWilliam  Woodruff III M.D.   On: 05/28/2017 08:18    Procedures Procedures (including critical care time)  Medications Ordered in ED Medications  prochlorperazine (COMPAZINE) injection 10 mg (10 mg Intravenous Given 05/28/17 0840)  diphenhydrAMINE (BENADRYL) injection 25 mg (25 mg Intravenous Given 05/28/17 0840)  sodium chloride 0.9 % bolus 1,000 mL (1,000 mLs Intravenous New Bag/Given 05/28/17 0835)  dexamethasone (DECADRON) injection 10 mg (10 mg Intravenous Given 05/28/17 0840)  ketorolac (TORADOL) 15 MG/ML injection 15 mg (15 mg Intravenous Given 05/28/17 0840)     Initial Impression / Assessment and Plan / ED Course  I have reviewed the triage vital signs and the nursing notes.  Pertinent labs & imaging results that were available during my care of the patient were reviewed by me and considered in my medical decision making (see chart for details).     41 yo F with h/o anxiety here with transient right>left body "heaviness," tingling in bilateral hands, and blurred vision, all resolving followed by HA. CT head neg for acute abnormality. Labs reassuring; she has mild leukocytosis which is down from prior. No fever, neck stiffness, or s/s meningitis or encephalitis. Sx completely resolved s/p migraine cocktail. Suspect pt has complex migraine (h/o same) versus anxiety/panic attack. Low suspicion for CVA - pt has no risk factors, sx are rapidly resolving, and she has no CVA/TIA risk factors. ABCD2 score is 0-2. She does have possible ethmoid sinusitis which could be contributing and will tx, without signs of bony destruction/invasive infection.  Discussed labs, imaging with pt and my low suspicion for TIA. Pt has no other risk  factors, as mentioned. Given resolution with migraine cocktail and well appearance, do not feel further CVA/TIA work-up indicated at this time. Will refer for outpt neurology, follow-up.  Final Clinical Impressions(s) / ED Diagnoses   Final diagnoses:  Complicated migraine  Acute recurrent ethmoidal sinusitis    New Prescriptions New Prescriptions   AMOXICILLIN-CLAVULANATE (AUGMENTIN) 875-125 MG TABLET    Take 1 tablet by mouth every 12 (twelve) hours.   BUTALBITAL-ACETAMINOPHEN-CAFFEINE (FIORICET, ESGIC) 50-325-40 MG TABLET    Take 1-2 tablets by mouth every 6 (six) hours as needed for headache.     Shaune PollackIsaacs, Janes Colegrove, MD 05/28/17 [phone]

## 2017-06-03 ENCOUNTER — Emergency Department (HOSPITAL_COMMUNITY)
Admission: EM | Admit: 2017-06-03 | Discharge: 2017-06-03 | Disposition: A | Discharge status: Home / Self Care | Payer: BLUE CROSS/BLUE SHIELD | Attending: Emergency Medicine | Admitting: Emergency Medicine

## 2017-06-03 ENCOUNTER — Encounter (HOSPITAL_COMMUNITY): Payer: Self-pay

## 2017-06-03 ENCOUNTER — Emergency Department (HOSPITAL_COMMUNITY): Payer: BLUE CROSS/BLUE SHIELD

## 2017-06-03 DIAGNOSIS — Y999 Unspecified external cause status: Secondary | ICD-10-CM | POA: Diagnosis not present

## 2017-06-03 DIAGNOSIS — X509XXA Other and unspecified overexertion or strenuous movements or postures, initial encounter: Secondary | ICD-10-CM | POA: Diagnosis not present

## 2017-06-03 DIAGNOSIS — G5602 Carpal tunnel syndrome, left upper limb: Secondary | ICD-10-CM | POA: Insufficient documentation

## 2017-06-03 DIAGNOSIS — Z7982 Long term (current) use of aspirin: Secondary | ICD-10-CM | POA: Diagnosis not present

## 2017-06-03 DIAGNOSIS — Y929 Unspecified place or not applicable: Secondary | ICD-10-CM | POA: Insufficient documentation

## 2017-06-03 DIAGNOSIS — F17210 Nicotine dependence, cigarettes, uncomplicated: Secondary | ICD-10-CM | POA: Insufficient documentation

## 2017-06-03 DIAGNOSIS — Y9389 Activity, other specified: Secondary | ICD-10-CM | POA: Insufficient documentation

## 2017-06-03 DIAGNOSIS — M25532 Pain in left wrist: Secondary | ICD-10-CM | POA: Diagnosis present

## 2017-06-03 NOTE — ED Triage Notes (Signed)
Pt has swelling to left wrist that started over the weekend pt doesn't recall any injury, brusing and pain also noted to the wrist

## 2017-06-03 NOTE — ED Notes (Signed)
Patient transported to X-ray 

## 2017-06-03 NOTE — Discharge Instructions (Addendum)
Please read and follow all provided instructions.  Your diagnoses today include:  1. Left wrist pain     Tests performed today include: Vital signs. See below for your results today.   Medications prescribed:  Take as prescribed   Home care instructions:  Follow any educational materials contained in this packet.  Follow-up instructions: Please follow-up with your primary care provider for further evaluation of symptoms and treatment   Return instructions:  Please return to the Emergency Department if you do not get better, if you get worse, or new symptoms OR  - Fever (temperature greater than 101.26F)  - Bleeding that does not stop with holding pressure to the area    -Severe pain (please note that you may be more sore the day after your accident)  - Chest Pain  - Difficulty breathing  - Severe nausea or vomiting  - Inability to tolerate food and liquids  - Passing out  - Skin becoming red around your wounds  - Change in mental status (confusion or lethargy)  - New numbness or weakness    Please return if you have any other emergent concerns.  Additional Information:  Your vital signs today were: BP 118/73    Pulse 80    Temp 99.2 F (37.3 C) (Oral)    Resp 17    Ht 5\' 6"  (1.676 m)    Wt 82.6 kg (182 lb)    LMP 01/04/2016 (Approximate)    SpO2 100%    BMI 29.38 kg/m  If your blood pressure (BP) was elevated above 135/85 this visit, please have this repeated by your doctor within one month. ---------------

## 2017-06-03 NOTE — ED Provider Notes (Signed)
[facility] DEPT Provider Note   CSN: [id number] Arrival date & time: 06/03/17  [id number] By signing my name below, I, Nichole Gonzalez, attest that this documentation has been prepared under the direction and in the presence of Nichole Piliyler Marceil Welp, PA-C. Electronically Signed: Thelma BargeNick Gonzalez, Scribe. 06/03/17. 10:18 AM.  History   Chief Complaint Chief Complaint  Patient presents with  . Wrist Pain    left   The history is provided by the patient. No language interpreter was used.   HPI Comments: Nichole Gonzalez is a 41 y.o. female with a h/o osteoarthritis who presents to the Emergency Department complaining of constant, gradually worsening left-sided wrist pain that began 3 days ago. She has associated swelling and mild numbness/tingling in her left fingertips. She states she has been cleaning her house over the weekend when the pain began but is unsure if she injured herself. She has difficulty dropping items due to the injury. She notes holding items and touching her wrist makes the pain worse. She has tried using ice and taking ibuprofen and Excedrin with no relief. Rates pain 8/10 with movement. Minimal at rest. Pt was seen last week for a sinus infection and was discharged. She has had a HA but notes this is from her recent sinus infection. She denies fever, chills, and PMHx of gout.   History reviewed. No pertinent past medical history.  Patient Active Problem List   Diagnosis Date Noted  . 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 Allergic rhinitis 09/03/2015  . Dyspnea 09/03/2015  . Wheezing 09/03/2015  . Tobacco abuse 09/03/2015  . Hyperthyroidism 04/21/2015  . Abnormal thyroid stimulating hormone (TSH) level 02/17/2015  . Chronic fatigue 02/17/2015    Past Surgical History:  Procedure Laterality Date  . ABDOMINAL HYSTERECTOMY    . APPENDECTOMY    . CHOLECYSTECTOMY    . KNEE ARTHROSCOPY    . right foot    . TUBAL LIGATION      OB History    No data available       Home  Medications    Prior to Admission medications   Medication Sig Start Date End Date Taking? Authorizing Provider  amoxicillin-clavulanate (AUGMENTIN) 875-125 MG tablet Take 1 tablet by mouth every 12 (twelve) hours. 05/28/17 06/07/17  Shaune PollackIsaacs, Cameron, MD  aspirin EC 81 MG tablet Take 81 mg by mouth daily.    [provider]  aspirin-acetaminophen-caffeine (EXCEDRIN EXTRA STRENGTH) [phone] MG per tablet Take 2 tablets by mouth every 6 (six) hours as needed for headache (and pain).    [provider]  butalbital-acetaminophen-caffeine Marikay Alar(FIORICET, ESGIC) [phone] MG tablet Take 1-2 tablets by mouth every 6 (six) hours as needed for headache. 05/28/17 05/28/18  Shaune PollackIsaacs, Cameron, MD  caffeine 200 MG TABS tablet Take 200 mg by mouth daily as needed (alertness).    [provider]  cyclobenzaprine (FLEXERIL) 10 MG tablet Take 10 mg by mouth 3 (three) times daily as needed for muscle spasms.  01/16/16   [provider]  dicyclomine (BENTYL) 20 MG tablet Take 20 mg by mouth daily.  01/07/16   [provider]  EPIPEN 2-PAK 0.3 MG/0.3ML SOAJ injection  05/16/15   [provider]  ibuprofen (ADVIL,MOTRIN) 200 MG tablet Take 1,200 mg by mouth 2 (two) times daily as needed for headache or moderate pain.    [provider]  Loratadine-Pseudoephedrine (ALLERGY RELIEF-D PO) Take 1 tablet by mouth daily as needed (allergies).     [provider]  montelukast (SINGULAIR) 10 MG tablet  Take 10 mg by mouth daily with breakfast.    [provider]  omeprazole (PRILOSEC) 40 MG capsule Take 40 mg by mouth daily.    [provider]  ondansetron (ZOFRAN) 4 MG tablet Take 4 mg by mouth daily as needed for nausea or vomiting.  04/26/15   [provider]  Phentermine-Topiramate (QSYMIA) 7.5-46 MG CP24 Take 1 tablet by mouth every morning.    [provider]  PROAIR RESPICLICK 108 (90 BASE) MCG/ACT AEPB Inhale 2 puffs into the lungs  2 (two) times daily as needed (shortness of breath).  05/26/15   [provider]  traZODone (DESYREL) 100 MG tablet Take 100 mg by mouth at bedtime.    [provider]  venlafaxine (EFFEXOR) 100 MG tablet Take 100 mg by mouth daily.  02/04/15   [provider]    Family History Family History  Problem Relation Age of Onset  . Thyroid disease Sister     Social History Social History  Substance Use Topics  . Smoking status: Current Every Day Smoker    Packs/day: 1.00    Types: Cigarettes  . Smokeless tobacco: Never Used  . Alcohol use No     Allergies   Codeine   Review of Systems Review of Systems  Constitutional: Negative for chills and fever.  Musculoskeletal: Positive for arthralgias and joint swelling.  Neurological: Positive for numbness and headaches.     Physical Exam Updated Vital Signs BP 118/73   Pulse 80   Temp 99.2 F (37.3 C) (Oral)   Resp 17   Ht 5\' 6"  (1.676 m)   Wt 182 lb (82.6 kg)   LMP 01/04/2016 (Approximate)   SpO2 100%   BMI 29.38 kg/m   Physical Exam  Constitutional: She is oriented to person, place, and time. Vital signs are normal. She appears well-developed and well-nourished.  HENT:  Head: Normocephalic and atraumatic.  Right Ear: Hearing normal.  Left Ear: Hearing normal.  Eyes: Conjunctivae and EOM are normal. Pupils are equal, round, and reactive to light.  Cardiovascular: Normal rate and regular rhythm.   Pulmonary/Chest: Effort normal.  Musculoskeletal:  No scaphoid tenderness No TTP of any phalanges on left hand No TTP of the Left radial head Some TTP to ulnar and over the anterior forearm Decreased left 4/5 strength with grip strength and 5/5 on the right  Decreased strength 4/5 on the left with wrist radial deviation and with extension Positive Tinel's Sign  Neurological: She is alert and oriented to person, place, and time.  Skin: Skin is warm and dry.  Psychiatric: She has a normal mood and  affect. Her speech is normal and behavior is normal. Thought content normal.  Nursing note and vitals reviewed.    ED Treatments / Results  DIAGNOSTIC STUDIES: Oxygen Saturation is 100% on RA, normal by my interpretation.    COORDINATION OF CARE: 10:12 AM Discussed treatment plan with pt at bedside and pt agreed to plan.  Labs (all labs ordered are listed, but only abnormal results are displayed) Labs Reviewed - No data to display  EKG  EKG Interpretation None       Radiology Dg Wrist Complete Left  Result Date: 06/03/2017 CLINICAL DATA:  Left wrist pain, no known injury, initial encounter EXAM: LEFT WRIST - COMPLETE 3+ VIEW COMPARISON:  None. FINDINGS: There is no evidence of fracture or dislocation. There is no evidence of arthropathy or other focal bone abnormality. Soft tissues are unremarkable. IMPRESSION: No acute abnormality noted.  Electronically Signed   By: Alcide Clever M.D.   On: 06/03/2017 11:49    Procedures Procedures (including critical care time)  Medications Ordered in ED Medications - No data to display   Initial Impression / Assessment and Plan / ED Course  I have reviewed the triage vital signs and the nursing notes.  Pertinent labs & imaging results that were available during my care of the patient were reviewed by me and considered in my medical decision making (see chart for details).  Final Clinical Impressions(s) / ED Diagnoses  I have reviewed and evaluated the relevant imaging studies. I have reviewed the relevant previous healthcare records. I obtained HPI from historian.  ED Course:  Assessment: Patient X-Ray negative for obvious fracture or dislocation. Likely carpal tunnel syndrome as pt types a lot and wrist pain is exacerbated with use. Pt advised to follow up with orthopedics. Patient given brace while in ED, conservative therapy recommended and discussed. Patient will be discharged home & is agreeable with above plan. Returns precautions  discussed. Pt appears safe for discharge  Disposition/Plan:  DC home Additional Verbal discharge instructions given and discussed with patient.  Pt Instructed to f/u with Ortho in the next week for evaluation and treatment of symptoms. Return precautions given Pt acknowledges and agrees with plan  Supervising Physician Jacalyn Lefevre, MD   Final diagnoses:  Left wrist pain  Carpal tunnel syndrome of left wrist    New Prescriptions New Prescriptions   No medications on file   I personally performed the services described in this documentation, which was scribed in my presence. The recorded information has been reviewed and is accurate.    Nichole Pili, PA-C 06/03/17 1154    Jacalyn Lefevre, MD 06/03/17 1329

## 2017-07-19 ENCOUNTER — Encounter: Payer: Self-pay | Admitting: Diagnostic Neuroimaging

## 2017-08-09 ENCOUNTER — Ambulatory Visit: Payer: BLUE CROSS/BLUE SHIELD | Admitting: Diagnostic Neuroimaging

## 2018-07-18 ENCOUNTER — Emergency Department
Admission: EM | Admit: 2018-07-18 | Discharge: 2018-07-18 | Disposition: A | Discharge status: Home / Self Care | Payer: BLUE CROSS/BLUE SHIELD | Attending: Emergency Medicine | Admitting: Emergency Medicine

## 2018-07-18 ENCOUNTER — Encounter: Payer: Self-pay | Admitting: Emergency Medicine

## 2018-07-18 ENCOUNTER — Other Ambulatory Visit: Payer: Self-pay

## 2018-07-18 DIAGNOSIS — E0590 Thyrotoxicosis, unspecified without thyrotoxic crisis or storm: Secondary | ICD-10-CM | POA: Insufficient documentation

## 2018-07-18 DIAGNOSIS — Z79899 Other long term (current) drug therapy: Secondary | ICD-10-CM | POA: Insufficient documentation

## 2018-07-18 DIAGNOSIS — M5441 Lumbago with sciatica, right side: Secondary | ICD-10-CM | POA: Insufficient documentation

## 2018-07-18 DIAGNOSIS — Z7982 Long term (current) use of aspirin: Secondary | ICD-10-CM | POA: Insufficient documentation

## 2018-07-18 DIAGNOSIS — F17210 Nicotine dependence, cigarettes, uncomplicated: Secondary | ICD-10-CM | POA: Insufficient documentation

## 2018-07-18 MED ORDER — KETOROLAC TROMETHAMINE 30 MG/ML IJ SOLN
30.0000 mg | Freq: Once | INTRAMUSCULAR | Status: AC
  Administered 2018-07-18: 30 mg via INTRAMUSCULAR
  Filled 2018-07-18: qty 1

## 2018-07-18 MED ORDER — CYCLOBENZAPRINE HCL 10 MG PO TABS: 10.0000 mg | ORAL_TABLET | Freq: Three times a day (TID) | ORAL | 0 refills | Status: AC | PRN

## 2018-07-18 MED ORDER — PREDNISONE 20 MG PO TABS: 20.0000 mg | ORAL_TABLET | Freq: Every day | ORAL | 0 refills | Status: AC

## 2018-07-18 MED ORDER — HYDROCODONE-ACETAMINOPHEN 5-325 MG PO TABS: 1.0000 | ORAL_TABLET | Freq: Three times a day (TID) | ORAL | 0 refills | Status: AC | PRN

## 2018-07-18 NOTE — ED Triage Notes (Signed)
Lower back pain that started this am, states it hurts worse to sit down, denies known injury to area.

## 2018-07-18 NOTE — ED Provider Notes (Signed)
[facility] Emergency Department Provider Note ____________________________________________  Time seen: 1030  I have reviewed the triage vital signs and the nursing notes.  HISTORY  Chief Complaint  Back Pain  HPI Nichole Gonzalez is a 42 y.o. female presents herself to the ED for evaluation of continued low back pain.  Patient describes awakening this morning with right-sided low back pain.  She also describes discomfort to the right SI joint.  In the posterior right leg.  She reports she was evaluated 2 weeks prior and [facility] emergency department, after she had similar complaints of low back pain.  According to the patient she had spent several days prior staining a deck, and sustained a mechanical injury.  She was apparently diagnosed with a "torn muscle," following that visit.  She was evaluated diagnostically with a CT scan according to the patient.  Patient was discharged with a muscle relaxant and took the medications until complete.  She reports onset this morning without known injury, accident, or trauma.  She denies any saddle anesthesias, incontinence, or footdrop.  She reports pain is worsened with attempts to sit on the right hip and buttocks.  She reports intermittent episodes of low back pain over the years but denies any chronic symptoms.  She has had evaluation of her cervical spine which reveals some degenerative disc disease.  History reviewed. No pertinent past medical history.  Patient Active Problem List   Diagnosis Date Noted  . 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 Allergic rhinitis 09/03/2015  . Dyspnea 09/03/2015  . Wheezing 09/03/2015  . Tobacco abuse 09/03/2015  . Hyperthyroidism 04/21/2015  . Abnormal thyroid stimulating hormone (TSH) level 02/17/2015  . Chronic fatigue 02/17/2015    Past Surgical History:  Procedure Laterality Date  . ABDOMINAL HYSTERECTOMY    . APPENDECTOMY    . CHOLECYSTECTOMY    . KNEE ARTHROSCOPY    . right foot     . TUBAL LIGATION      Prior to Admission medications   Medication Sig Start Date End Date Taking? Authorizing Provider  aspirin EC 81 MG tablet Take 81 mg by mouth daily.    [provider]  aspirin-acetaminophen-caffeine (EXCEDRIN EXTRA STRENGTH) [phone] MG per tablet Take 2 tablets by mouth every 6 (six) hours as needed for headache (and pain).    [provider]  caffeine 200 MG TABS tablet Take 200 mg by mouth daily as needed (alertness).    [provider]  cyclobenzaprine (FLEXERIL) 10 MG tablet Take 1 tablet (10 mg total) by mouth 3 (three) times daily as needed for up to 5 days for muscle spasms. 07/18/18 07/23/18  Sharne Linders, Charlesetta Ivory, PA-C  dicyclomine (BENTYL) 20 MG tablet Take 20 mg by mouth daily.  01/07/16   [provider]  EPIPEN 2-PAK 0.3 MG/0.3ML SOAJ injection  05/16/15   [provider]  HYDROcodone-acetaminophen (NORCO) 5-325 MG tablet Take 1 tablet by mouth 3 (three) times daily as needed for up to 3 days. 07/18/18 07/21/18  Josehua Hammar, Charlesetta Ivory, PA-C  ibuprofen (ADVIL,MOTRIN) 200 MG tablet Take 1,200 mg by mouth 2 (two) times daily as needed for headache or moderate pain.    [provider]  Loratadine-Pseudoephedrine (ALLERGY RELIEF-D PO) Take 1 tablet by mouth daily as needed (allergies).     [provider]  montelukast (SINGULAIR) 10 MG tablet Take 10 mg by mouth daily with breakfast.    [provider]  omeprazole (PRILOSEC) 40 MG capsule Take 40 mg by  mouth daily.    [provider]  ondansetron (ZOFRAN) 4 MG tablet Take 4 mg by mouth daily as needed for nausea or vomiting.  04/26/15   [provider]  Phentermine-Topiramate (QSYMIA) 7.5-46 MG CP24 Take 1 tablet by mouth every morning.    [provider]  predniSONE (DELTASONE) 20 MG tablet Take 1 tablet (20 mg total) by mouth daily with breakfast for 7 days. 07/18/18 07/25/18  Savayah Waltrip, Charlesetta IvoryJenise V Bacon, PA-C  PROAIR  RESPICLICK 108 (90 BASE) MCG/ACT AEPB Inhale 2 puffs into the lungs 2 (two) times daily as needed (shortness of breath).  05/26/15   [provider]  traZODone (DESYREL) 100 MG tablet Take 100 mg by mouth at bedtime.    [provider]  venlafaxine (EFFEXOR) 100 MG tablet Take 100 mg by mouth daily.  02/04/15   [provider]    Allergies Codeine  Family History  Problem Relation Age of Onset  . Thyroid disease Sister     Social History Social History   Tobacco Use  . Smoking status: Current Every Day Smoker    Packs/day: 1.00    Types: Cigarettes  . Smokeless tobacco: Never Used  Substance Use Topics  . Alcohol use: No  . Drug use: No    Review of Systems  Constitutional: Negative for fever. Cardiovascular: Negative for chest pain. Respiratory: Negative for shortness of breath. Gastrointestinal: Negative for abdominal pain, vomiting and diarrhea. Genitourinary: Negative for dysuria. Musculoskeletal: Positive for right-sided low back pain. Skin: Negative for rash. Neurological: Negative for headaches, focal weakness or numbness. ____________________________________________  PHYSICAL EXAM:  VITAL SIGNS: ED Triage Vitals  Enc Vitals Group     BP 07/18/18 1014 118/82     Pulse Rate 07/18/18 1014 75     Resp 07/18/18 1014 16     Temp 07/18/18 1014 98.8 F (37.1 C)     Temp Source 07/18/18 1014 Oral     SpO2 07/18/18 1014 99 %     Weight 07/18/18 1010 195 lb (88.5 kg)     Height 07/18/18 1010 5\' 6"  (1.676 m)     Head Circumference --      Peak Flow --      Pain Score 07/18/18 1010 10     Pain Loc --      Pain Edu? --      Excl. in GC? --     Constitutional: Alert and oriented. Well appearing and in no distress. Head: Normocephalic and atraumatic. Cardiovascular: Normal rate, regular rhythm. Normal distal pulses. Respiratory: Normal respiratory effort. No wheezes/rales/rhonchi. Musculoskeletal: Normal spinal alignment without midline  tenderness, spasm, deformity, or step-off.  Patient mildly tender to palpation over the right SI joint region. Nontender with normal range of motion in all extremities.  Neurologic: Nerves II through XII grossly intact.  Normal LE DTRs bilaterally.  Normal toe dorsiflexion and foot eversion on exam.  Negative seated straight leg raise bilaterally.  Normal gait without ataxia. Normal speech and language. No gross focal neurologic deficits are appreciated. Skin:  Skin is warm, dry and intact. No rash noted. ____________________________________________  PROCEDURES  Procedures Toradol 30 mg IM ____________________________________________  INITIAL IMPRESSION / ASSESSMENT AND PLAN / ED COURSE  Patient with ED evaluation of right-sided low back pain with sciatic irritation.  Her exam is overall benign without any acute neuromuscular deficit.  Patient has had no traumatic injury since her initial eval with CT scans 2 weeks prior.  I discussed with the patient likely low  probability of an acute fracture or dislocation.  Patient can furs and we have declined any imaging at this time.  Patient will be discharged with prescription for prednisone, Flexeril, and #10 hydrocodone.  She is encouraged to follow-up with her primary care provider for ongoing symptom management.  I reviewed the patient's prescription history over the last 12 months in the multi-state controlled substances database(s) that includes [place], [place], [place], [place], [place], [place], [place], [place], [place], [place], [place], [place], [place], and [place].  Results were notable for no current narcotic prescriptions.  ____________________________________________  FINAL CLINICAL IMPRESSION(S) / ED DIAGNOSES  Final diagnoses:  Acute right-sided low back pain with right-sided sciatica      Lissa Hoard, PA-C 07/18/18 1829    Minna Antis, MD 07/29/18 2205

## 2018-07-18 NOTE — Discharge Instructions (Addendum)
Your exam is consistent with sciatica. Take the prescription meds as directed. Follow-up with your provider for ongoing symptoms.

## 2018-08-15 ENCOUNTER — Other Ambulatory Visit: Payer: Self-pay

## 2018-08-15 ENCOUNTER — Emergency Department (HOSPITAL_COMMUNITY)
Admission: EM | Admit: 2018-08-15 | Discharge: 2018-08-15 | Disposition: A | Discharge status: Home / Self Care | Payer: Self-pay | Attending: Emergency Medicine | Admitting: Emergency Medicine

## 2018-08-15 ENCOUNTER — Encounter (HOSPITAL_COMMUNITY): Payer: Self-pay | Admitting: *Deleted

## 2018-08-15 DIAGNOSIS — Y939 Activity, unspecified: Secondary | ICD-10-CM | POA: Insufficient documentation

## 2018-08-15 DIAGNOSIS — Y929 Unspecified place or not applicable: Secondary | ICD-10-CM | POA: Insufficient documentation

## 2018-08-15 DIAGNOSIS — Y999 Unspecified external cause status: Secondary | ICD-10-CM | POA: Insufficient documentation

## 2018-08-15 DIAGNOSIS — Z79899 Other long term (current) drug therapy: Secondary | ICD-10-CM | POA: Insufficient documentation

## 2018-08-15 DIAGNOSIS — S161XXA Strain of muscle, fascia and tendon at neck level, initial encounter: Secondary | ICD-10-CM | POA: Insufficient documentation

## 2018-08-15 DIAGNOSIS — X58XXXA Exposure to other specified factors, initial encounter: Secondary | ICD-10-CM | POA: Insufficient documentation

## 2018-08-15 DIAGNOSIS — H53149 Visual discomfort, unspecified: Secondary | ICD-10-CM | POA: Insufficient documentation

## 2018-08-15 DIAGNOSIS — F17210 Nicotine dependence, cigarettes, uncomplicated: Secondary | ICD-10-CM | POA: Insufficient documentation

## 2018-08-15 DIAGNOSIS — R51 Headache: Secondary | ICD-10-CM | POA: Insufficient documentation

## 2018-08-15 DIAGNOSIS — M62838 Other muscle spasm: Secondary | ICD-10-CM | POA: Insufficient documentation

## 2018-08-15 DIAGNOSIS — R110 Nausea: Secondary | ICD-10-CM | POA: Insufficient documentation

## 2018-08-15 DIAGNOSIS — Z7982 Long term (current) use of aspirin: Secondary | ICD-10-CM | POA: Insufficient documentation

## 2018-08-15 MED ORDER — METHOCARBAMOL 500 MG PO TABS: 500.0000 mg | ORAL_TABLET | Freq: Every evening | ORAL | 0 refills | Status: DC | PRN

## 2018-08-15 MED ORDER — LIDOCAINE 5 % EX PTCH: 1.0000 | MEDICATED_PATCH | CUTANEOUS | 0 refills | Status: DC

## 2018-08-15 MED ORDER — PREDNISONE 10 MG (21) PO TBPK: ORAL_TABLET | Freq: Every day | ORAL | 0 refills | Status: DC

## 2018-08-15 MED ORDER — DEXAMETHASONE SODIUM PHOSPHATE 10 MG/ML IJ SOLN
10.0000 mg | Freq: Once | INTRAMUSCULAR | Status: AC
  Administered 2018-08-15: 10 mg via INTRAMUSCULAR
  Filled 2018-08-15: qty 1

## 2018-08-15 MED ORDER — METOCLOPRAMIDE HCL 5 MG/ML IJ SOLN
10.0000 mg | Freq: Once | INTRAMUSCULAR | Status: AC
  Administered 2018-08-15: 10 mg via INTRAMUSCULAR
  Filled 2018-08-15: qty 2

## 2018-08-15 MED ORDER — KETOROLAC TROMETHAMINE 15 MG/ML IJ SOLN
15.0000 mg | Freq: Once | INTRAMUSCULAR | Status: AC
  Administered 2018-08-15: 15 mg via INTRAMUSCULAR
  Filled 2018-08-15: qty 1

## 2018-08-15 NOTE — Discharge Instructions (Signed)
Take prednisone as prescribed.  Do not take other anti-inflammatories at the same time (Advil, Motrin, ibuprofen, Aleve). You may supplement with Tylenol if you need further pain control. Use Robaxin as needed for muscle stiffness or soreness. Have caution, as this may make you tired or groggy. Do not drive or operate heavy machinery while taking this medication.  Use lidocaine patches as needed for further pain. Use muscle creams (bengay, icy hot, salonpas) as needed for pain.  Use heat to help relax the muscles.  Follow up with your primary care doctor if pain is not improving with this treatment in 1 week.  Return to the ER if you develop high fevers, numbness, loss of bowel or bladder control, or any new or concerning symptoms.

## 2018-08-15 NOTE — ED Notes (Signed)
Bed: WTR6 Expected date: 08/15/18 Expected time: 11:00 AM Means of arrival:  Comments:

## 2018-08-15 NOTE — ED Provider Notes (Signed)
[facility]-EMERGENCY DEPT Provider Note   CSN: [id number] Arrival date & time: 08/15/18  1204     History   Chief Complaint Chief Complaint  Patient presents with  . Neck Pain    HPI Nichole Gonzalez is a 42 y.o. female center for evaluation of right-sided neck pain.  Patient states she has a history of degenerative disease in her neck.  Pain has been going on for several months, however today she had worsening pain in her right shoulder and shooting down to her hand.  She has associated headache with photophobia and nausea.  She has taken 5 Excedrin without improvement of symptoms.  She states she is unable to take NSAIDs due to history of stomach ulcer.  She denies significant abdominal pain or vomiting.  Patient denies fevers, chills, rash, fall, trauma, or injury.  She denies chest pain, shortness of breath, abdominal pain, urinary symptoms, abnormal bowel movements.  She denies numbness or tingling.  Patient has not seen her primary care doctor about her worsening pain since began several months ago.  She has been taking Excedrin and nothing else.  Combination of things including muscle relaxer, steroids, and pain medication has helped in the past.  HPI  History reviewed. No pertinent past medical history.  Patient Active Problem List   Diagnosis Date Noted  . 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 Allergic rhinitis 09/03/2015  . Dyspnea 09/03/2015  . Wheezing 09/03/2015  . Tobacco abuse 09/03/2015  . Hyperthyroidism 04/21/2015  . Abnormal thyroid stimulating hormone (TSH) level 02/17/2015  . Chronic fatigue 02/17/2015    Past Surgical History:  Procedure Laterality Date  . ABDOMINAL HYSTERECTOMY    . APPENDECTOMY    . CHOLECYSTECTOMY    . KNEE ARTHROSCOPY    . right foot    . TUBAL LIGATION       OB History   None      Home Medications    Prior to Admission medications   Medication Sig Start Date End Date Taking? Authorizing Provider    aspirin EC 81 MG tablet Take 81 mg by mouth daily.    [provider]  aspirin-acetaminophen-caffeine (EXCEDRIN EXTRA STRENGTH) [phone] MG per tablet Take 2 tablets by mouth every 6 (six) hours as needed for headache (and pain).    [provider]  caffeine 200 MG TABS tablet Take 200 mg by mouth daily as needed (alertness).    [provider]  dicyclomine (BENTYL) 20 MG tablet Take 20 mg by mouth daily.  01/07/16   [provider]  EPIPEN 2-PAK 0.3 MG/0.3ML SOAJ injection  05/16/15   [provider]  ibuprofen (ADVIL,MOTRIN) 200 MG tablet Take 1,200 mg by mouth 2 (two) times daily as needed for headache or moderate pain.    [provider]  lidocaine (LIDODERM) 5 % Place 1 patch onto the skin daily. Remove & Discard patch within 12 hours or as directed by MD 08/15/18   Nichole Emert, PA-C  Loratadine-Pseudoephedrine (ALLERGY RELIEF-D PO) Take 1 tablet by mouth daily as needed (allergies).     [provider]  methocarbamol (ROBAXIN) 500 MG tablet Take 1 tablet (500 mg total) by mouth at bedtime as needed for muscle spasms. 08/15/18   Treyshaun Keatts, PA-C  montelukast (SINGULAIR) 10 MG tablet Take 10 mg by mouth daily with breakfast.    [provider]  omeprazole (PRILOSEC) 40 MG capsule Take 40 mg by mouth daily.    [provider]  ondansetron [facility])  4 MG tablet Take 4 mg by mouth daily as needed for nausea or vomiting.  04/26/15   [provider]  Phentermine-Topiramate (QSYMIA) 7.5-46 MG CP24 Take 1 tablet by mouth every morning.    [provider]  predniSONE (STERAPRED UNI-PAK 21 TAB) 10 MG (21) TBPK tablet Take by mouth daily. Take 6 tabs by mouth daily  for 2 days, then 5 tabs for 2 days, then 4 tabs for 2 days, then 3 tabs for 2 days, 2 tabs for 2 days, then 1 tab by mouth daily for 2 days 08/15/18   Nichole Brenton, PA-C  PROAIR RESPICLICK 108 (90 BASE) MCG/ACT AEPB Inhale 2 puffs into  the lungs 2 (two) times daily as needed (shortness of breath).  05/26/15   [provider]  traZODone (DESYREL) 100 MG tablet Take 100 mg by mouth at bedtime.    [provider]  venlafaxine (EFFEXOR) 100 MG tablet Take 100 mg by mouth daily.  02/04/15   [provider]    Family History Family History  Problem Relation Age of Onset  . Thyroid disease Sister     Social History Social History   Tobacco Use  . Smoking status: Current Every Day Smoker    Packs/day: 1.00    Types: Cigarettes  . Smokeless tobacco: Never Used  Substance Use Topics  . Alcohol use: No  . Drug use: No     Allergies   Codeine   Review of Systems Review of Systems  Gastrointestinal: Positive for nausea.  Musculoskeletal: Positive for myalgias and neck pain.  Neurological: Positive for headaches.  All other systems reviewed and are negative.    Physical Exam Updated Vital Signs BP (!) 149/92 (BP Location: Left Arm)   Pulse 84   Temp 98.6 F (37 C) (Oral)   Resp 19   Ht 5\' 6"  (1.676 m)   Wt 89.4 kg   LMP 01/04/2016 (Approximate)   SpO2 100%   BMI 31.80 kg/m   Physical Exam  Constitutional: She is oriented to person, place, and time. She appears well-developed and well-nourished. No distress.  Sitting in a chair in no acute distress  HENT:  Head: Normocephalic and atraumatic.  No obvious injury to the head.  Eyes: Pupils are equal, round, and reactive to light. Conjunctivae and EOM are normal.  EOMI and PERRLA.  No nystagmus.  Neck: Normal range of motion. Neck supple.    Tenderness palpation of the right-sided neck musculature and trapezius.  Minimal pain over midline C-spine, no step-offs or deformities.  No pain of the left musculature.  No signs of meningismus  Cardiovascular: Normal rate, regular rhythm and intact distal pulses.  Pulmonary/Chest: Effort normal and breath sounds normal. No respiratory distress. She has no wheezes.  Abdominal: Soft. She  exhibits no distension and no mass. There is no tenderness. There is no rebound and no guarding.  No tenderness palpation of the abdomen.  Soft without rigidity, guarding, distention.  Negative rebound.  No peritonitis.  Musculoskeletal: Normal range of motion.  Grip strength intact bilaterally.  Radial pulses intact bilaterally.  Sensation intact bilaterally. No tenderness palpation of back or midline spine.  Neurological: She is alert and oriented to person, place, and time. No sensory deficit.  No obvious neurologic deficits.  CN intact.  Grip strength intact.  Skin: Skin is warm and dry. Capillary refill takes less than 2 seconds.  Psychiatric: She has a normal mood and affect.  Nursing note and vitals reviewed.  ED Treatments / Results  Labs (all labs ordered are listed, but only abnormal results are displayed) Labs Reviewed - No data to display  EKG None  Radiology No results found.  Procedures Procedures (including critical care time)  Medications Ordered in ED Medications  ketorolac (TORADOL) 15 MG/ML injection 15 mg (15 mg Intramuscular Given 08/15/18 1314)  dexamethasone (DECADRON) injection 10 mg (10 mg Intramuscular Given 08/15/18 1314)  metoCLOPramide (REGLAN) injection 10 mg (10 mg Intramuscular Given 08/15/18 1314)     Initial Impression / Assessment and Plan / ED Course  I have reviewed the triage vital signs and the nursing notes.  Pertinent labs & imaging results that were available during my care of the patient were reviewed by me and considered in my medical decision making (see chart for details).     Pt pesenting for evaluation of worsened right-sided neck pain with shooting pain down her arm.  Physical exam reassuring, no obvious neurologic deficits.  This is likely acute on chronic neck pain.  Likely muscular in origin, although consider early radiculopathy.  She has associated headache and nausea.  History of migraines, states this is similar.  Doubt  infection including meningitis, spinal cord compression, myelopathy, or vertebral/cervical injury.  I do not believe imaging would be beneficial.  Patient is able to move her head, no signs of meningismus.  No fevers or chills.  History of degenerative disc disease, likely source of the patient's pain.  Will give a headache cocktail, offered p.o., IM, or IV.  Will try IM per patient request.  Patient does not want to wait in the ER for the symptoms to improve.  Will plan for discharge with prednisone pack, muscle relaxers, and lidocaine patches.  Patient to follow-up with her PCP for further evaluation. At thsit time, pt appears safe for d/c.  Strict return precautions given.  Patient states she understands and agrees to plan.  Final Clinical Impressions(s) / ED Diagnoses   Final diagnoses:  Trapezius muscle spasm  Acute strain of neck muscle, initial encounter    ED Discharge Orders         Ordered    predniSONE (STERAPRED UNI-PAK 21 TAB) 10 MG (21) TBPK tablet  Daily     08/15/18 1301    lidocaine (LIDODERM) 5 %  Every 24 hours     08/15/18 1301    methocarbamol (ROBAXIN) 500 MG tablet  At bedtime PRN     08/15/18 1301           Riham Polyakov, PA-C 08/15/18 1336    Linwood DibblesKnapp, Jon, MD 08/15/18 [phone]

## 2018-08-15 NOTE — ED Triage Notes (Signed)
Pt complains of pain in neck for the past 2 days. Pt states she has hx of degenerative disc disease. Pt denies injury.

## 2018-08-15 NOTE — ED Notes (Signed)
Pt is alert and oriented x 4 and is verbally responsive. Pt reports left sided neck pain that radiates to left shoulder with associated HA  started yesterday. Pt reports that she took Excedrin for pain and states that she had no relief.

## 2018-08-27 ENCOUNTER — Encounter: Payer: Self-pay | Admitting: Emergency Medicine

## 2018-08-27 ENCOUNTER — Emergency Department
Admission: EM | Admit: 2018-08-27 | Discharge: 2018-08-27 | Disposition: A | Discharge status: Home / Self Care | Payer: Self-pay | Attending: Student in an Organized Health Care Education/Training Program | Admitting: Student in an Organized Health Care Education/Training Program

## 2018-08-27 DIAGNOSIS — Z5321 Procedure and treatment not carried out due to patient leaving prior to being seen by health care provider: Secondary | ICD-10-CM | POA: Insufficient documentation

## 2018-08-27 DIAGNOSIS — R42 Dizziness and giddiness: Secondary | ICD-10-CM | POA: Insufficient documentation

## 2018-08-27 DIAGNOSIS — R110 Nausea: Secondary | ICD-10-CM | POA: Insufficient documentation

## 2018-08-27 LAB — BASIC METABOLIC PANEL
ANION GAP: 9 (ref 5–15)
BUN: <5 mg/dL — ABNORMAL LOW (ref 6–20)
CALCIUM: 9.2 mg/dL (ref 8.9–10.3)
CO2: 25 mmol/L (ref 22–32)
Chloride: 105 mmol/L (ref 98–111)
Creatinine, Ser: 0.65 mg/dL (ref 0.44–1.00)
GFR calc Af Amer: >60 mL/min (ref >60)
GLUCOSE: 114 mg/dL — AB (ref 70–99)
Potassium: 3.2 mmol/L — ABNORMAL LOW (ref 3.5–5.1)
Sodium: 139 mmol/L (ref 135–145)

## 2018-08-27 LAB — CBC
HCT: 43.6 % (ref 35.0–47.0)
HEMOGLOBIN: 15.3 g/dL (ref 12.0–16.0)
MCH: 34.4 pg — ABNORMAL HIGH (ref 26.0–34.0)
MCHC: 35.2 g/dL (ref 32.0–36.0)
MCV: 98 fL (ref 80.0–100.0)
Platelets: 294 10*3/uL (ref 150–440)
RBC: 4.45 MIL/uL (ref 3.80–5.20)
RDW: 13.2 % (ref 11.5–14.5)
WBC: 22.2 10*3/uL — ABNORMAL HIGH (ref 3.6–11.0)

## 2018-08-27 MED ORDER — ONDANSETRON 4 MG PO TBDP
4.0000 mg | ORAL_TABLET | Freq: Once | ORAL | Status: AC | PRN
  Administered 2018-08-27: 4 mg via ORAL
  Filled 2018-08-27: qty 1

## 2018-08-27 NOTE — ED Notes (Signed)
Called for room, no answer looked outside and in the bathroom. LWBS

## 2018-08-27 NOTE — ED Notes (Signed)
Called for room x 1 looked in the bathroom and outside, no answer.

## 2018-08-27 NOTE — ED Notes (Addendum)
FIRST NURSE NOTE:  Pt c/o nausea, dizziness, and loss of voice over the past few days, pt states she keeps getting shaky and hot. No distress noted in lobby.  Declined wheelchair.

## 2018-08-27 NOTE — ED Triage Notes (Signed)
Pt arrives with complaints of dizziness, nausea, and shaking for the last few days. Pt denies any fever. Pt alert & oriented x 4, in NAD. Pt reports body aches.

## 2018-08-28 ENCOUNTER — Telehealth: Payer: Self-pay | Admitting: Emergency Medicine

## 2018-08-28 ENCOUNTER — Encounter (HOSPITAL_COMMUNITY): Payer: Self-pay | Admitting: *Deleted

## 2018-08-28 NOTE — Telephone Encounter (Signed)
Called patient due to lwot to inquire about condition and follow up plans. Says she is not better, but was worried about getting home--that she may not have been able to drive.  She is unable to miss work, and cannot get to her pcp.  I told her she is welcome to return here, especially if she is still feeling bad.  I told her that she should be able to drive home, and that she can tell the caregivers that she needs to drive.

## 2018-11-05 IMAGING — DX DG WRIST COMPLETE 3+V*L*
4 series · 4 of 4 positions shown · non-contrast
Comparison: None.

CLINICAL DATA: Left wrist pain, no known injury, initial encounter

EXAM:
LEFT WRIST - COMPLETE 3+ VIEW

[x wrist pa left]
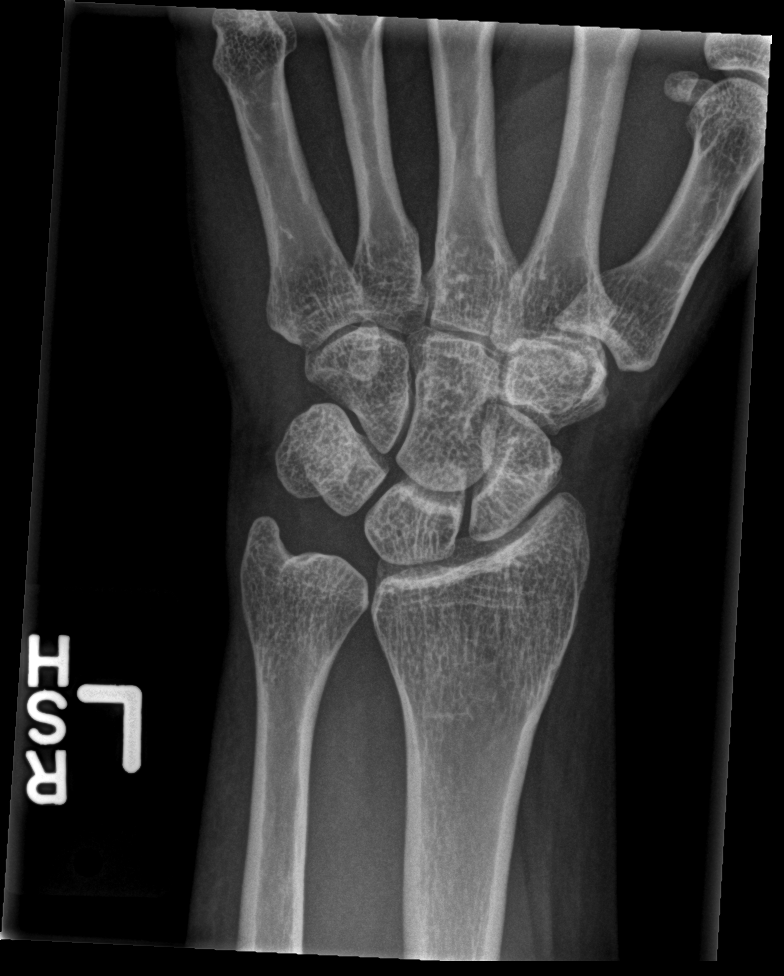

[x wrist obl left]
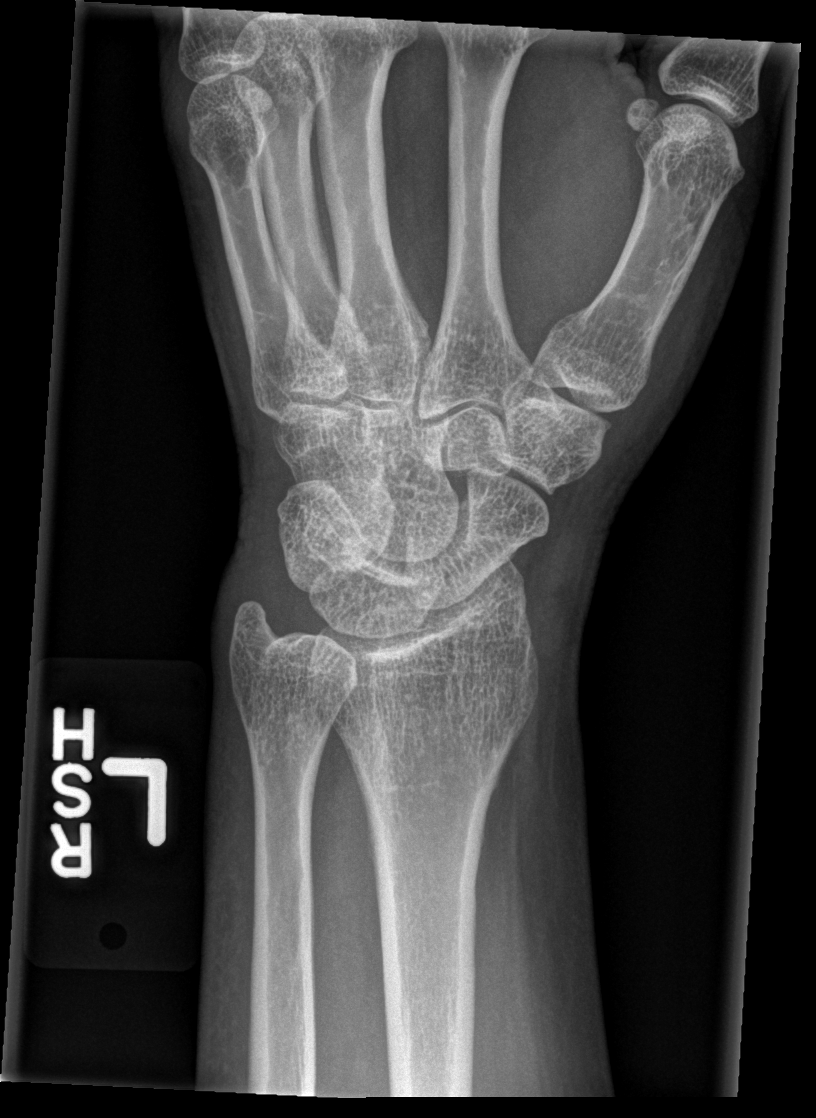

[x wrist lat left]
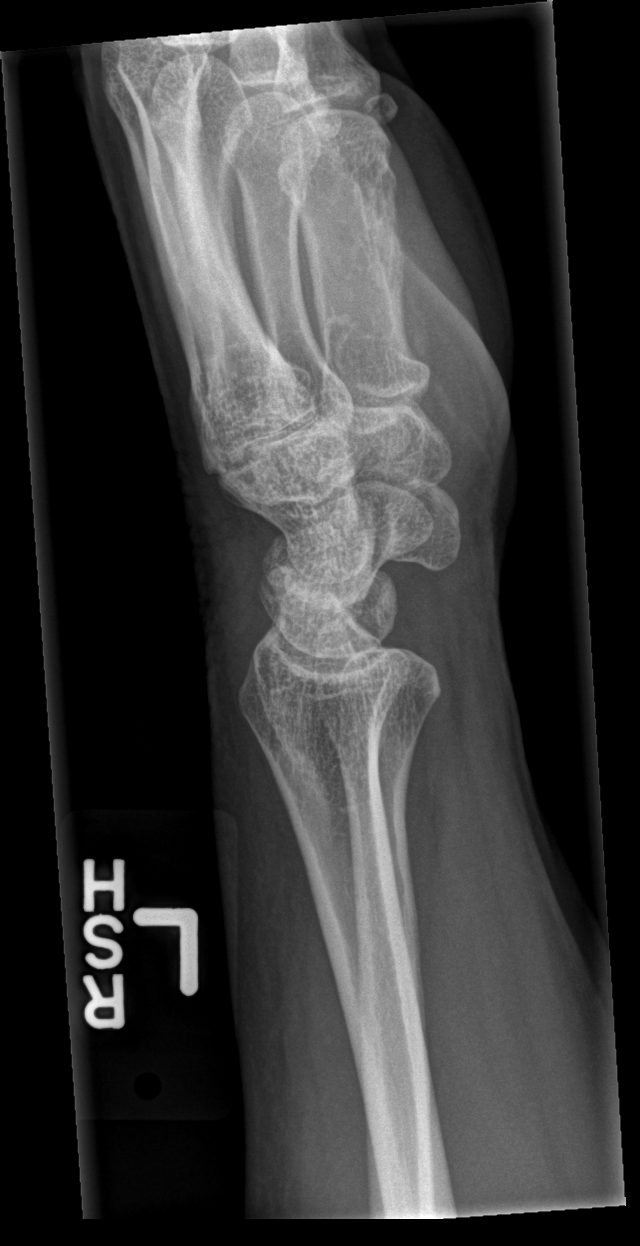

[x wrist navicular view left]
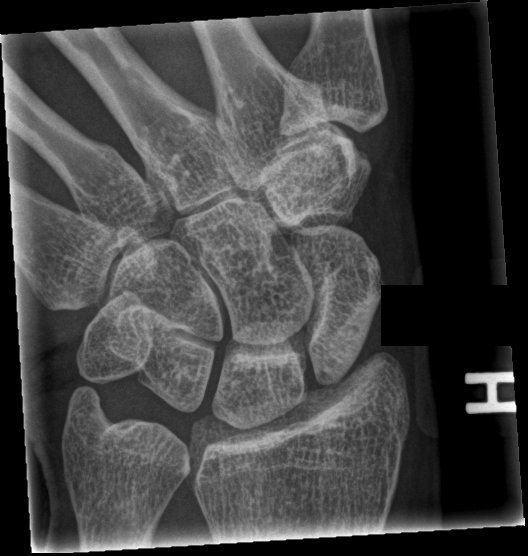

[4 of 4 positions shown; findings below may reference images not displayed]

FINDINGS: There is no evidence of fracture or dislocation. There is no
evidence of arthropathy or other focal bone abnormality. Soft
tissues are unremarkable.
IMPRESSION: No acute abnormality noted.

## 2019-03-19 NOTE — ED Notes (Signed)
 ED Patient Summary       ;       [facility]  [address] [place] [address]  [phone]  Discharge Instructions (Patient)  _______________________________________     Name: Danielle Aguirre, Danielle Aguirre  DOB:  1976/02/06                   MRN: [id number]                   FIN: [id number]  Reason For Visit: Cough; SOB - Shortness of breath; Chest pain; Diarrhea; Abdominal pain; SOB  Final Diagnosis: Cough     Visit Date: 03/19/2019 10:58:00  Address: [address] [place] [address]  Phone: [phone]     Primary Care Provider:      Name: PCP,  NONE      Phone:         Emergency Department Providers:         Primary Physician:   LORETA LAMAR HERO         [facility] would like to thank you for allowing us  to assist you with your healthcare needs. The following includes patient education materials and information regarding your injury/illness.     Follow-up Instructions: You were treated today on an emergency basis, it may be wise to contact your primary care provider to notify them of your visit today. You may have been referred to your regular doctor or a specialist, please follow up as instructed. If your condition worsens or you can't get in to see the doctor, contact the Emergency Department.               With: Address: When:   Follow up with primary care provider  Within 3 to 5 days, only if needed   Comments:   Follow up with your MD if not improved.   Return to ED if symptoms worsen              Printed Prescriptions:    Patient Education Materials:  Discharge Orders          Discharge Patient 03/19/19 11:41:00 EDT         Comment:      Cough, Adult, Easy-to-Read     Cough, Adult    A cough helps to clear your throat and lungs. A cough may last only 2?3 weeks (acute), or it may last longer than 8 weeks (chronic). Many different things can cause a cough. A cough may be a sign of an illness or another medical  condition.      HOME CARE     Pay attention to any changes in your cough.     Take medicines only as told by your doctor.    ? If you were prescribed an antibiotic medicine, take it as told by your doctor. Do not stop taking it even if you start to feel better.    ? Talk with your doctor before you try using a cough medicine.     Drink enough fluid to keep your pee (urine) clear or pale yellow.     If the air is dry, use a cold steam vaporizer or humidifier in your home.     Stay away from things that make you cough at work or at home.     If your cough is worse at night, try using extra pillows to raise your head up  higher while you sleep.     Do not smoke, and try not to be around smoke. If you need help quitting, ask your doctor.     Do not have caffeine.     Do not drink alcohol.     Rest as needed.    GET HELP IF:     You have new problems (symptoms).     You cough up yellow fluid (pus).     Your cough does not get better after 2?3 weeks, or your cough gets worse.     Medicine does not help your cough and you are not sleeping well.     You have pain that gets worse or pain that is not helped with medicine.     You have a fever.     You are losing weight and you do not know why.     You have night sweats.    GET HELP RIGHT AWAY IF:     You cough up blood.     You have trouble breathing.     Your heartbeat is very fast.    This information is not intended to replace advice given to you by your health care provider. Make sure you discuss any questions you have with your health care provider.    Document Released: 08/23/2011 Document Revised: 08/31/2015 Document Reviewed: 02/16/2015  Elsevier Interactive Patient Education ?2016 Elsevier Inc.         Allergy Info: codeine     Medication Information:  [facility] provided you with a complete list of medications post discharge, if you have been instructed to stop taking a medication please ensure you also follow up with  this information to your Primary Care Physician.  Unless otherwise noted, patient will continue to take medications as prescribed prior to the Emergency Room visit.  Any specific questions regarding your chronic medications and dosages should be discussed with your physician(s) and pharmacist.          buPROPion (Wellbutrin XL 150 mg/24 hours oral tablet, extended release) 1 Tabs Oral (given by mouth) every 24 hours.,  SWALLOW WHOLE, DO NOT CHEW,  BREAK, OR CRUSH.  dicyclomine (Bentyl) 40 Milligram Oral (given by mouth) every day.  fexofenadine (Allegra 24 Hour Allergy) Oral (given by mouth) every day.  traZODone (traZODone 100 mg oral tablet) 1 Tabs Oral (given by mouth) every day.      Medications Administered During Visit:       Major Tests and Procedures:  The following procedures and tests were performed during your Emergency Room visit.  COMMON PROCEDURES%>  COMMON PROCEDURES COMMENTS%>          Laboratory Orders  No laboratory orders were placed.              Radiology Orders  Name Status Details   XR Chest 2 Views Completed 03/19/19 11:12:00 EDT, STAT 1 hour or less, Reason: Other abnormalities of breathing, Transport Mode: STRETCHER, pp_set_radiology_subspecialty               Patient Care Orders  Name Status Details   Discharge Patient Ordered 03/19/19 11:41:00 EDT   ED Assessment Adult Completed 03/19/19 11:22:17 EDT, 03/19/19 11:22:17 EDT   ED Secondary Triage Completed 03/19/19 11:22:17 EDT, 03/19/19 11:22:17 EDT   ED Triage Adult Completed 03/19/19 10:58:25 EDT, 03/19/19 10:58:25 EDT       ---------------------------------------------------------------------------------------------------------------------  [facility] allows you to manage your health, view your test results, and retrieve  your discharge documents from your hospital stay securely and conveniently from your computer.     To begin the enrollment process, visit [email or web address] Click on "Sign up now" under [facility].    Comment:

## 2019-03-19 NOTE — ED Notes (Signed)
 ED Triage Note       ED Secondary Triage Entered On:  03/19/2019 11:27 EDT    Performed On:  03/19/2019 10:58 EDT by Leigh Botts               General Information   Barriers to Learning :   None evident   ED Home Meds Section :   Document assessment   [facility] ED Fall Risk Section :   Document assessment   ED Advance Directives Section :   Document assessment   Leigh Botts - 03/19/2019 11:22 EDT   (As Of: 03/19/2019 11:27:36 EDT)   Problems(Active)    Anxiety (SNOMED CT  :18866980 )  Name of Problem:   Anxiety ; Recorder:   Hill,  Mae; Confirmation:   Confirmed ; Classification:   Patient Stated ; Code:   18866980 ; Contributor System:   PowerChart ; Last Updated:   03/19/2019 11:21 EDT ; Life Cycle Date:   03/19/2019 ; Life Cycle Status:   Active ; Vocabulary:   SNOMED CT        Depression (SNOMED CT  :40787988 )  Name of Problem:   Depression ; Recorder:   Hill,  Mae; Confirmation:   Confirmed ; Classification:   Patient Stated ; Code:   40787988 ; Contributor System:   PowerChart ; Last Updated:   03/19/2019 11:21 EDT ; Life Cycle Date:   03/19/2019 ; Life Cycle Status:   Active ; Vocabulary:   SNOMED CT        GERD (gastroesophageal reflux disease) (SNOMED CT  :646864985 )  Name of Problem:   GERD (gastroesophageal reflux disease) ; Recorder:   Hill,  Mae; Confirmation:   Confirmed ; Classification:   Patient Stated ; Code:   646864985 ; Contributor System:   PowerChart ; Last Updated:   03/19/2019 11:21 EDT ; Life Cycle Date:   03/19/2019 ; Life Cycle Status:   Active ; Vocabulary:   SNOMED CT        Seasonal allergies (SNOMED CT  :7158715987 )  Name of Problem:   Seasonal allergies ; Recorder:   Hill,  Mae; Confirmation:   Confirmed ; Classification:   Patient Stated ; Code:   7158715987 ; Contributor System:   PowerChart ; Last Updated:   03/19/2019 11:22 EDT ; Life Cycle Date:   03/19/2019 ; Life Cycle Status:   Active ; Vocabulary:   SNOMED CT        Sleep disorder (SNOMED CT  :32714989 )  Name of Problem:   Sleep  disorder ; Recorder:   Hill,  Mae; Confirmation:   Confirmed ; Classification:   Patient Stated ; Code:   32714989 ; Contributor System:   PowerChart ; Last Updated:   03/19/2019 11:21 EDT ; Life Cycle Date:   03/19/2019 ; Life Cycle Status:   Active ; Vocabulary:   SNOMED CT          Diagnoses(Active)    Abdominal pain  Date:   03/19/2019 ; Diagnosis Type:   Reason For Visit ; Confirmation:   Complaint of ; Clinical Dx:   Abdominal pain ; Classification:   Medical ; Clinical Service:   Emergency medicine ; Code:   PNED ; Probability:   0 ; Diagnosis Code:   [id number]      Chest pain  Date:   03/19/2019 ; Diagnosis Type:   Reason For Visit ; Confirmation:   Complaint of ; Clinical Dx:   Chest pain ;  Classification:   Medical ; Clinical Service:   Emergency medicine ; Code:   PNED ; Probability:   0 ; Diagnosis Code:   [id number]      Diarrhea  Date:   03/19/2019 ; Diagnosis Type:   Reason For Visit ; Confirmation:   Complaint of ; Clinical Dx:   Diarrhea ; Classification:   Medical ; Clinical Service:   Emergency medicine ; Code:   PNED ; Probability:   0 ; Diagnosis Code:   [id number]      SOB - Shortness of breath  Date:   03/19/2019 ; Diagnosis Type:   Reason For Visit ; Confirmation:   Complaint of ; Clinical Dx:   SOB - Shortness of breath ; Classification:   Medical ; Clinical Service:   Emergency medicine ; Code:   PNED ; Probability:   0 ; Diagnosis Code:   [id number]             -    Procedure History   (As Of: 03/19/2019 11:27:36 EDT)     Anesthesia Minutes:   0 ; Procedure Name:   Gallbladder ; Procedure Minutes:   0 ; Last Reviewed Dt/Tm:   03/19/2019 11:24:22 EDT            Anesthesia Minutes:   0 ; Procedure Name:   Hysterectomy ; Procedure Minutes:   0 ; Last Reviewed Dt/Tm:   03/19/2019 11:24:22 EDT            Anesthesia Minutes:   0 ; Procedure Name:   Appendectomy ; Procedure Minutes:   0 ; Last Reviewed  Dt/Tm:   03/19/2019 11:24:22 EDT            Anesthesia Minutes:   0 ; Procedure Name:   Knee ; Procedure Minutes:   0 ; Comments:     03/19/2019 11:24 EDT - Leigh,  Mae  RIGHT ; Last Reviewed Dt/Tm:   03/19/2019 11:24:22 EDT            Anesthesia Minutes:   0 ; Procedure Name:   Foot ; Procedure Minutes:   0 ; Comments:     03/19/2019 11:24 EDT - Leigh,  Mae  RIGHT ; Last Reviewed Dt/Tm:   03/19/2019 11:24:22 EDT            [facility] Fall Risk Assessment Tool   Hx of falling last 3 months ED Fall :   No   Patient confused or disoriented ED Fall :   No   Patient intoxicated or sedated ED Fall :   No   Patient impaired gait ED Fall :   No   Use a mobility assistance device ED Fall :   No   Patient altered elimination ED Fall :   No   [facility] ED Fall Score :   0    Archbald,  Mae - 03/19/2019 11:22 EDT   ED Advance Directive   Advance Directive :   No   Hill,  Mae - 03/19/2019 11:22 EDT   Med Hx   Medication List   (As Of: 03/19/2019 11:27:36 EDT)   Home Meds    fexofenadine  :   fexofenadine ; Status:   Documented ; Ordered As Mnemonic:   Allegra 24 Hour Allergy ; Simple Display Line:   mg, Oral, Daily, 0 Refill(s) ; Catalog Code:   fexofenadine ; Order Dt/Tm:   03/19/2019 11:27:12 EDT          dicyclomine  :  dicyclomine ; Status:   Documented ; Ordered As Mnemonic:   Bentyl ; Simple Display Line:   40 mg, Oral, Daily, 0 Refill(s) ; Catalog Code:   dicyclomine ; Order Dt/Tm:   03/19/2019 11:26:03 EDT          traZODone  :   traZODone ; Status:   Documented ; Ordered As Mnemonic:   traZODone 100 mg oral tablet ; Simple Display Line:   100 mg, 1 tabs, Oral, Daily, 0 Refill(s) ; Catalog Code:   traZODone ; Order Dt/Tm:   03/19/2019 11:26:39 EDT          buPROPion  :   buPROPion ; Status:   Documented ; Ordered As Mnemonic:   Wellbutrin XL 150 mg/24 hours oral tablet, extended release ; Simple Display Line:   150 mg, 1 tabs, Oral, q24hr, 30 tabs, 0 Refill(s) ; Catalog Code:   buPROPion ; Order Dt/Tm:   03/19/2019 11:25:52 EDT ; Comment:     SWALLOW WHOLE, DO NOT CHEW,   BREAK, OR CRUSH.

## 2019-03-19 NOTE — Discharge Summary (Signed)
ED Clinical Summary                      [facility]  [address]  [place], [place] [address]  [phone]           PERSON INFORMATION  Name: Danielle Aguirre, Danielle Aguirre Age:  43 Years DOB: 1976/11/16   Sex: Female Language: English PCP: PCP,  NONE   Marital Status:  Separated Phone: [phone] Med Service: Christiana Pellant   MRN:  [id number] Acct# [id number] Arrival: 03/19/2019 10:58:00   Visit Reason: Cough; SOB - Shortness of breath; Chest pain; Diarrhea; Abdominal pain; SOB Acuity: 3 LOS: 000 00:51   Address:      [address] [place] [address]  Diagnosis:      Cough  Printed Prescriptions:            Allergies      codeine (Syncope)      Medications Administered During Visit:        Patient Medication List:              buPROPion (Wellbutrin XL 150 mg/24 hours oral tablet, extended release) 1 Tabs Oral (given by mouth) every 24 hours., " SWALLOW WHOLE, DO NOT CHEW,  BREAK, OR CRUSH."  dicyclomine (Bentyl) 40 Milligram Oral (given by mouth) every day.  fexofenadine (Allegra 24 Hour Allergy) Oral (given by mouth) every day.  traZODone (traZODone 100 mg oral tablet) 1 Tabs Oral (given by mouth) every day.       Major Tests and Procedures:  The following procedures and tests were performed during your ED visit.  COMMONPROCEDURES%>  COMMON PROCEDURESCOMMENTS%>          Laboratory Orders  No laboratory orders were placed.              Radiology Orders  Name Status Details   XR Chest 2 Views Completed 03/19/19 11:12:00 EDT, STAT 1 hour or less, Reason: Other abnormalities of breathing, Transport Mode: STRETCHER, pp_set_radiology_subspecialty               Patient Care Orders  Name Status Details   Discharge Patient Ordered 03/19/19 11:41:00 EDT   ED Assessment Adult Completed 03/19/19 11:22:17 EDT, 03/19/19 11:22:17 EDT   ED Secondary Triage Completed 03/19/19 11:22:17 EDT, 03/19/19 11:22:17 EDT   ED Triage Adult Completed 03/19/19 10:58:25 EDT, 03/19/19 10:58:25  EDT           PROVIDER INFORMATION              Provider Role Assigned Criss Rosales ED Provider 03/19/2019 11:11:44    Robley Fries ED Nurse 03/19/2019 11:11:54        Attending Physician:  Hermenia Fiscal    Admit Doc  ANDERS-MD,  Gerrit Friends   Consulting Doc       VITALS INFORMATION  Vital Sign Triage Latest   Temp Oral ORAL_1%>37.3 degC ORAL%>37.3 degC   Temp Temporal TEMPORAL_1%> TEMPORAL%>   Temp Intravascular INTRAVASCULAR_1%> INTRAVASCULAR%>   Temp Axillary AXILLARY_1%> AXILLARY%>   Temp Rectal RECTAL_1%> RECTAL%>   02 Sat 100 % 100 %   Respiratory Rate RATE_1%>17 br/min RATE%>17 br/min   Peripheral Pulse Rate PULSE RATE_1%> PULSE RATE%>   Apical Heart Rate HEART RATE_1%> HEART RATE%>   Blood Pressure BLOOD PRESSURE_1%>/ BLOOD PRESSURE_1%>92 mmHg BLOOD PRESSURE%>162 mmHg / BLOOD PRESSURE%>92 mmHg  ED Clinical Summary                      [facility]  [address]  [place], [place] [address]  [phone]           PERSON INFORMATION  Name: Danielle Aguirre, Danielle Aguirre Age:  43 Years DOB: 1976/11/16   Sex: Female Language: English PCP: PCP,  NONE   Marital Status:  Separated Phone: [phone] Med Service: Christiana Pellant   MRN:  [id number] Acct# [id number] Arrival: 03/19/2019 10:58:00   Visit Reason: Cough; SOB - Shortness of breath; Chest pain; Diarrhea; Abdominal pain; SOB Acuity: 3 LOS: 000 00:51   Address:      [address] [place] [address]  Diagnosis:      Cough  Printed Prescriptions:            Allergies      codeine (Syncope)      Medications Administered During Visit:        Patient Medication List:              buPROPion (Wellbutrin XL 150 mg/24 hours oral tablet, extended release) 1 Tabs Oral (given by mouth) every 24 hours., " SWALLOW WHOLE, DO NOT CHEW,  BREAK, OR CRUSH."  dicyclomine (Bentyl) 40 Milligram Oral (given by mouth) every day.  fexofenadine (Allegra 24 Hour Allergy) Oral (given by mouth) every day.  traZODone (traZODone 100 mg oral tablet) 1 Tabs Oral (given by mouth) every day.       Major Tests and Procedures:  The following procedures and tests were performed during your ED visit.  COMMONPROCEDURES%>  COMMON PROCEDURESCOMMENTS%>          Laboratory Orders  No laboratory orders were placed.              Radiology Orders  Name Status Details   XR Chest 2 Views Completed 03/19/19 11:12:00 EDT, STAT 1 hour or less, Reason: Other abnormalities of breathing, Transport Mode: STRETCHER, pp_set_radiology_subspecialty               Patient Care Orders  Name Status Details   Discharge Patient Ordered 03/19/19 11:41:00 EDT   ED Assessment Adult Completed 03/19/19 11:22:17 EDT, 03/19/19 11:22:17 EDT   ED Secondary Triage Completed 03/19/19 11:22:17 EDT, 03/19/19 11:22:17 EDT   ED Triage Adult Completed 03/19/19 10:58:25 EDT, 03/19/19 10:58:25  EDT           PROVIDER INFORMATION              Provider Role Assigned Criss Rosales ED Provider 03/19/2019 11:11:44    Robley Fries ED Nurse 03/19/2019 11:11:54        Attending Physician:  Hermenia Fiscal    Admit Doc  ANDERS-MD,  Gerrit Friends   Consulting Doc       VITALS INFORMATION  Vital Sign Triage Latest   Temp Oral ORAL_1%>37.3 degC ORAL%>37.3 degC   Temp Temporal TEMPORAL_1%> TEMPORAL%>   Temp Intravascular INTRAVASCULAR_1%> INTRAVASCULAR%>   Temp Axillary AXILLARY_1%> AXILLARY%>   Temp Rectal RECTAL_1%> RECTAL%>   02 Sat 100 % 100 %   Respiratory Rate RATE_1%>17 br/min RATE%>17 br/min   Peripheral Pulse Rate PULSE RATE_1%> PULSE RATE%>   Apical Heart Rate HEART RATE_1%> HEART RATE%>   Blood Pressure BLOOD PRESSURE_1%>/ BLOOD PRESSURE_1%>92 mmHg BLOOD PRESSURE%>162 mmHg / BLOOD PRESSURE%>92 mmHg  Immunizations      No Immunizations Documented This Visit       DISCHARGE INFORMATION   Discharge Disposition: H Outpt-Sent Home   Discharge Location:    Home   Discharge Date and Time:    03/19/2019 11:49:41   ED Checkout Date and Time:    03/19/2019 11:49:41     DEPART REASON INCOMPLETE INFORMATION               Depart Action Incomplete Reason   Interactive View/I&O Recently assessed               Problems      No Problems Documented              Smoking Status      No Smoking Status Documented         PATIENT EDUCATION INFORMATION  Instructions:       Cough, Adult, Easy-to-Read     Follow up:                  With: Address: When:   Follow up with primary care provider  Within 3 to 5 days, only if needed   Comments:   Follow up with your MD if not improved.   Return to ED if symptoms worsen           ED PROVIDER DOCUMENTATION     Patient:   TARANEH, WHITSETT             MRN: [id number]            FIN: [phone]               Age:   35 years     Sex:  Female     DOB:  1976/12/02   Associated Diagnoses:   Cough   Author:   Hermenia Fiscal      Basic Information   Additional information:  Chief Complaint from Nursing Triage Note   Chief Complaint  Chief Complaint: REFERRED TO ED BY VIRTUAL CARE DR Joelene Millin FOR CXR STAES PT. HX 1 WEEK OF FATIGUE & LOWER ABD PAIN. DIARRHEA . FEVER 99.9 1700 YESTERDAY WITH SOB AFTER WALKING THROUGH HOUSE. CHEST TIGHTNESS. TEMP 100.1. OB. ACCESS AT [facility] (03/19/19 10:58:00).      History of Present Illness   The patient presents with cough.  The onset was 2  weeks ago.  The course/duration of symptoms is constant.  Character dry.  The degree at onset was minimal.  The degree at present is moderate.  The exacerbating factor is none.  The relieving factor is none.  Risk factors consist of smoking.  Prior episodes: occasional.  Therapy today: none.  Associated symptoms: shortness of breath, chest pain, fever, denies rhinorrhea and denies nasal congestion.  "Fever" up to 99.9 last night. Pt reports DOE last night walking to kitchen, denies since then. Pt did a virtual screening for COVID19 today and was told to have CXR and was recommended for testing at mobile screening center.  Chest pain with cough.        Review of Systems   Constitutional symptoms:  Negative except as documented in HPI.   Skin symptoms:  Negative except as documented in HPI.   ENMT symptoms:  Negative except as documented in HPI.   Respiratory symptoms:  Negative except as documented in HPI.   Cardiovascular symptoms:  Negative except as documented in HPI.   Gastrointestinal symptoms:  Negative except as documented in  Immunizations      No Immunizations Documented This Visit       DISCHARGE INFORMATION   Discharge Disposition: H Outpt-Sent Home   Discharge Location:    Home   Discharge Date and Time:    03/19/2019 11:49:41   ED Checkout Date and Time:    03/19/2019 11:49:41     DEPART REASON INCOMPLETE INFORMATION               Depart Action Incomplete Reason   Interactive View/I&O Recently assessed               Problems      No Problems Documented              Smoking Status      No Smoking Status Documented         PATIENT EDUCATION INFORMATION  Instructions:       Cough, Adult, Easy-to-Read     Follow up:                  With: Address: When:   Follow up with primary care provider  Within 3 to 5 days, only if needed   Comments:   Follow up with your MD if not improved.   Return to ED if symptoms worsen           ED PROVIDER DOCUMENTATION     Patient:   TARANEH, WHITSETT             MRN: [id number]            FIN: [phone]               Age:   35 years     Sex:  Female     DOB:  1976/12/02   Associated Diagnoses:   Cough   Author:   Hermenia Fiscal      Basic Information   Additional information:  Chief Complaint from Nursing Triage Note   Chief Complaint  Chief Complaint: REFERRED TO ED BY VIRTUAL CARE DR Joelene Millin FOR CXR STAES PT. HX 1 WEEK OF FATIGUE & LOWER ABD PAIN. DIARRHEA . FEVER 99.9 1700 YESTERDAY WITH SOB AFTER WALKING THROUGH HOUSE. CHEST TIGHTNESS. TEMP 100.1. OB. ACCESS AT [facility] (03/19/19 10:58:00).      History of Present Illness   The patient presents with cough.  The onset was 2  weeks ago.  The course/duration of symptoms is constant.  Character dry.  The degree at onset was minimal.  The degree at present is moderate.  The exacerbating factor is none.  The relieving factor is none.  Risk factors consist of smoking.  Prior episodes: occasional.  Therapy today: none.  Associated symptoms: shortness of breath, chest pain, fever, denies rhinorrhea and denies nasal congestion.  "Fever" up to 99.9 last night. Pt reports DOE last night walking to kitchen, denies since then. Pt did a virtual screening for COVID19 today and was told to have CXR and was recommended for testing at mobile screening center.  Chest pain with cough.        Review of Systems   Constitutional symptoms:  Negative except as documented in HPI.   Skin symptoms:  Negative except as documented in HPI.   ENMT symptoms:  Negative except as documented in HPI.   Respiratory symptoms:  Negative except as documented in HPI.   Cardiovascular symptoms:  Negative except as documented in HPI.   Gastrointestinal symptoms:  Negative except as documented in

## 2019-03-19 NOTE — ED Provider Notes (Signed)
Cough        Patient:   Danielle Aguirre, Danielle Aguirre             MRN: [id number]            FIN: [phone]               Age:   43 years     Sex:  Female     DOB:  [birth year]   Associated Diagnoses:   Cough   Author:   Hermenia Fiscal      Basic Information   Additional information: Chief Complaint from Nursing Triage Note   Chief Complaint  Chief Complaint: REFERRED TO ED BY VIRTUAL CARE DR Joelene Millin FOR CXR STAES PT. HX 1 WEEK OF FATIGUE & LOWER ABD PAIN. DIARRHEA . FEVER 99.9 1700 YESTERDAY WITH SOB AFTER WALKING THROUGH HOUSE. CHEST TIGHTNESS. TEMP 100.1. OB. ACCESS AT [facility] (03/19/19 10:58:00).      History of Present Illness   The patient presents with cough.  The onset was 2  weeks ago.  The course/duration of symptoms is constant.  Character dry.  The degree at onset was minimal.  The degree at present is moderate.  The exacerbating factor is none.  The relieving factor is none.  Risk factors consist of smoking.  Prior episodes: occasional.  Therapy today: none.  Associated symptoms: shortness of breath, chest pain, fever, denies rhinorrhea and denies nasal congestion.  "Fever" up to 99.9 last night. Pt reports DOE last night walking to kitchen, denies since then. Pt did a virtual screening for COVID19 today and was told to have CXR and was recommended for testing at mobile screening center.  Chest pain with cough.        Review of Systems   Constitutional symptoms:  Negative except as documented in HPI.   Skin symptoms:  Negative except as documented in HPI.   ENMT symptoms:  Negative except as documented in HPI.   Respiratory symptoms:  Negative except as documented in HPI.   Cardiovascular symptoms:  Negative except as documented in HPI.   Gastrointestinal symptoms:  Negative except as documented in HPI.   Musculoskeletal symptoms:  Negative except as documented in HPI.   Neurologic symptoms:  Negative except as documented in HPI.      Health Status   Allergies:    Allergic Reactions  (Selected)  Severe  Codeine- Syncope..   Medications:  (Selected)   Documented Medications  Documented  Allegra 24 Hour Allergy: mg, Oral, Daily, 0 Refill(s)  Bentyl: 40 mg, Oral, Daily, 0 Refill(s)  Wellbutrin XL 150 mg/24 hours oral tablet, extended release: 150 mg, 1 tabs, Oral, q24hr, 30 tabs, 0 Refill(s)  traZODone 100 mg oral tablet: 100 mg, 1 tabs, Oral, Daily, 0 Refill(s).      Past Medical/ Family/ Social History   Problem list:    Active Problems (5)  Anxiety   Depression   GERD (gastroesophageal reflux disease)   Seasonal allergies   Sleep disorder   , per nurse's notes.      Physical Examination               Vital Signs   Vital Signs   03/19/2019 11:27 EDT Respiratory Rate 17 br/min   03/19/2019 10:58 EDT Systolic Blood Pressure 162 mmHg  HI    Diastolic Blood Pressure 92 mmHg  HI    Temperature Oral 37.3 degC    Heart Rate Monitored 93 bpm    Respiratory Rate 17  br/min    SpO2 100 %    Measurements   03/19/2019 11:22 EDT Body Mass Index est meas 29.86 kg/m2   03/19/2019 11:22 EDT Body Mass Index Measured 29.86 kg/m2   03/19/2019 10:58 EDT Height/Length Measured 167.64 cm    Weight Dosing 83.91 kg    Basic Oxygen Information   03/19/2019 10:58 EDT SpO2 100 %    Oxygen Therapy Room air    General:  Alert, no acute distress.    Skin:  Warm, dry.    Head:  Normocephalic, atraumatic.    Cardiovascular:  Regular rate and rhythm, No murmur, Normal peripheral perfusion.    Respiratory:  Lungs are clear to auscultation, respirations are non-labored, breath sounds are equal, Symmetrical chest wall expansion.    Back:  Normal range of motion.   Musculoskeletal:  Normal ROM.   Neurological:  No focal neurological deficit observed.   Psychiatric:  Cooperative, appropriate mood & affect.       Medical Decision Making   Differential Diagnosis::  Bronchitis, upper respiratory infection, pneumonia.    Electrocardiogram:  Emergency Provider interpretation performed by me, time 03/19/2019 11:16:00, rate 74, normal sinus rhythm,  No ST-T changes, no ectopy, normal PR & QRS intervals.    Radiology results:  Rad Results (ST)   XR Chest 2 Views  ?  03/19/19 11:28:24  Chest PA and lateral: 03/19/19    INDICATION:Other abnormalities of breathing.    COMPARISON: None    FINDINGS: No focal airspace disease, pleural effusion or pneumothorax.  Cardiomediastinal contours are within normal limits. No acute osseous  abnormality.    IMPRESSION:  No focal infiltrate or overt pulmonary edema.  ?  Signed By: Annamaria Helling      Reexamination/ Reevaluation   Vital signs   Basic Oxygen Information   03/19/2019 10:58 EDT SpO2 100 %    Oxygen Therapy Room air         Impression and Plan   Diagnosis   Cough (ICD10-CM R05, Discharge, Medical)   Plan   Condition: Improved.    Disposition: Discharged: Time  03/19/2019 11:40:00, to home.    Patient was given the following educational materials: Cough, Adult, Easy-to-Read, Patient advised to quit smoking. Smoking cessation counseling 3 - 10 minutes..    Follow up with: Follow up with primary care provider Within 3 to 5 days, only if needed Follow up with your MD if not improved.  Return to ED if symptoms worsen.    Counseled: Patient, Regarding diagnosis, Regarding diagnostic results, Regarding treatment plan, Patient indicated understanding of instructions.    Signature Line     Electronically Signed on 03/19/2019 11:41 AM EDT   ________________________________________________   Hermenia Fiscal

## 2019-03-19 NOTE — ED Notes (Signed)
ED Triage Note       ED Triage Adult Entered On:  03/19/2019 11:22 EDT    Performed On:  03/19/2019 10:58 EDT by Robley Fries               Triage   Chief Complaint :   REFERRED TO ED BY VIRTUAL CARE DR Joelene Millin FOR CXR STAES PT. HX 1 WEEK OF FATIGUE & LOWER ABD PAIN. DIARRHEA . FEVER 99.9 1700 YESTERDAY WITH SOB AFTER WALKING THROUGH HOUSE. CHEST TIGHTNESS. TEMP 100.1. OB. ACCESS AT [facility].   Numeric Rating Pain Scale :   6   ED Pain Details :   Pain Details   [place] Mode of Arrival :   Wheelchair   Infectious Disease Documentation :   Document assessment   Patient received chemo or biotherapy last 48 hrs? :   No   Temperature Oral :   37.3 degC(Converted to: 99.1 degF)    Heart Rate Monitored :   93 bpm   Respiratory Rate :   17 br/min   Systolic Blood Pressure :   162 mmHg (HI)    Diastolic Blood Pressure :   92 mmHg (HI)    SpO2 :   100 %   Oxygen Therapy :   Room air   Patient presentation :   None of the above   Chief Complaint or Presentation suggest infection :   No   Dosing Weight Obtained By :   Patient stated   Weight Dosing :   83.91 kg(Converted to: 185 lb 0 oz)    Height :   167.64 cm(Converted to: 5 ft 6 in)    Body Mass Index Dosing :   30 kg/m2   Robley Fries - 03/19/2019 11:12 EDT   DCP GENERIC CODE   Tracking Acuity :   3   Tracking Group :   ED [employer]   [place] - 03/19/2019 11:12 EDT   ED General Section :   Document assessment   Pregnancy Status :   Patient denies   Approximate Last Menstrual Period :   HYSTERECTOMY   ED Allergies Section :   Document assessment   ED Reason for Visit Section :   Document assessment   ED Quick Assessment :   Patient appears awake, alert, oriented to baseline. Skin warm and dry. Moves all extremities. Respiration even and unlabored. Appears in no apparent distress.   [place],  Mae - 03/19/2019 11:12 EDT   ID Risk Screen Symptoms   Recent Travel History :   No recent travel   Close Contact with COVID-19  ID :   No   Have you been tested for  COVID-19 ID :   No   TB Symptom Screen :   No symptoms   Hill,  Mae - 03/19/2019 11:12 EDT   Allergies   (As Of: 03/19/2019 11:22:16 EDT)   Allergies (Active)   codeine  Estimated Onset Date:   Unspecified ; Reactions:   Syncope ; Created By:   Robley Fries; Reaction Status:   Active ; Category:   Drug ; Substance:   codeine ; Type:   Allergy ; Severity:   Severe ; Updated By:   Robley Fries; Reviewed Date:   03/19/2019 11:19 EDT        Pain Assessment   Preferred Pain Tool :   Numeric rating scale   Numeric Rating With Activity :   6  Numeric Rating Score With Activity :   6    Time Pattern :   Constant   Pain Location :   Chest   Quality :   Tightness   Hill,  Mae - 03/19/2019 11:12 EDT   Image 4 -  Images currently included in the form version of this document have not been included in the text rendition version of the form.   Psycho-Social   Last 3 mo, thoughts killing self/others :   Patient denies   Injuries/Abuse/Neglect in Household :   Denies   ED Behavioral Activity Rating Scale :   4 - Quiet and awake (normal level of activity)   Robley Fries - 03/19/2019 11:12 EDT   ED Reason for Visit   (As Of: 03/19/2019 11:22:16 EDT)   Problems(Active)    Anxiety (SNOMED CT  :60737106 )  Name of Problem:   Anxiety ; Recorder:   Hill,  Mae; Confirmation:   Confirmed ; Classification:   Patient Stated ; Code:   26948546 ; Contributor System:   PowerChart ; Last Updated:   03/19/2019 11:21 EDT ; Life Cycle Date:   03/19/2019 ; Life Cycle Status:   Active ; Vocabulary:   SNOMED CT        Depression (SNOMED CT  :27035009 )  Name of Problem:   Depression ; Recorder:   Hill,  Mae; Confirmation:   Confirmed ; Classification:   Patient Stated ; Code:   38182993 ; Contributor System:   Dietitian ; Last Updated:   03/19/2019 11:21 EDT ; Life Cycle Date:   03/19/2019 ; Life Cycle Status:   Active ; Vocabulary:   SNOMED CT        GERD (gastroesophageal reflux disease) (SNOMED CT  :716967893 )  Name of Problem:   GERD (gastroesophageal reflux  disease) ; Recorder:   Hill,  Mae; Confirmation:   Confirmed ; Classification:   Patient Stated ; Code:   810175102 ; Contributor System:   PowerChart ; Last Updated:   03/19/2019 11:21 EDT ; Life Cycle Date:   03/19/2019 ; Life Cycle Status:   Active ; Vocabulary:   SNOMED CT        Seasonal allergies (SNOMED CT  :5852778242 )  Name of Problem:   Seasonal allergies ; Recorder:   Hill,  Mae; Confirmation:   Confirmed ; Classification:   Patient Stated ; Code:   [id number] ; Contributor System:   Dietitian ; Last Updated:   03/19/2019 11:22 EDT ; Life Cycle Date:   03/19/2019 ; Life Cycle Status:   Active ; Vocabulary:   SNOMED CT        Sleep disorder (SNOMED CT  :40086761 )  Name of Problem:   Sleep disorder ; Recorder:   Hill,  Mae; Confirmation:   Confirmed ; Classification:   Patient Stated ; Code:   95093267 ; Contributor System:   Dietitian ; Last Updated:   03/19/2019 11:21 EDT ; Life Cycle Date:   03/19/2019 ; Life Cycle Status:   Active ; Vocabulary:   SNOMED CT          Diagnoses(Active)    Abdominal pain  Date:   03/19/2019 ; Diagnosis Type:   Reason For Visit ; Confirmation:   Complaint of ; Clinical Dx:   Abdominal pain ; Classification:   Medical ; Clinical Service:   Emergency medicine ; Code:   PNED ; Probability:   0 ; Diagnosis Code:   [id number]  Chest pain  Date:   03/19/2019 ; Diagnosis Type:   Reason For Visit ; Confirmation:   Complaint of ; Clinical Dx:   Chest pain ; Classification:   Medical ; Clinical Service:   Emergency medicine ; Code:   PNED ; Probability:   0 ; Diagnosis Code:   [id number]      Diarrhea  Date:   03/19/2019 ; Diagnosis Type:   Reason For Visit ; Confirmation:   Complaint of ; Clinical Dx:   Diarrhea ; Classification:   Medical ; Clinical Service:   Emergency medicine ; Code:   PNED ; Probability:   0 ; Diagnosis Code:   [id number]      SOB - Shortness of breath  Date:   03/19/2019 ; Diagnosis  Type:   Reason For Visit ; Confirmation:   Complaint of ; Clinical Dx:   SOB - Shortness of breath ; Classification:   Medical ; Clinical Service:   Emergency medicine ; Code:   PNED ; Probability:   0 ; Diagnosis Code:   [phone]

## 2019-03-19 NOTE — ED Notes (Signed)
ED Patient Education Note     Patient Education Materials Follows:  Allergy     Cough, Adult    A cough helps to clear your throat and lungs. A cough may last only 2?3 weeks (acute), or it may last longer than 8 weeks (chronic). Many different things can cause a cough. A cough may be a sign of an illness or another medical condition.      HOME CARE     Pay attention to any changes in your cough.     Take medicines only as told by your doctor.    ? If you were prescribed an antibiotic medicine, take it as told by your doctor. Do not stop taking it even if you start to feel better.    ? Talk with your doctor before you try using a cough medicine.     Drink enough fluid to keep your pee (urine) clear or pale yellow.     If the air is dry, use a cold steam vaporizer or humidifier in your home.     Stay away from things that make you cough at work or at home.     If your cough is worse at night, try using extra pillows to raise your head up higher while you sleep.     Do not smoke, and try not to be around smoke. If you need help quitting, ask your doctor.     Do not have caffeine.     Do not drink alcohol.     Rest as needed.    GET HELP IF:     You have new problems (symptoms).     You cough up yellow fluid (pus).     Your cough does not get better after 2?3 weeks, or your cough gets worse.     Medicine does not help your cough and you are not sleeping well.     You have pain that gets worse or pain that is not helped with medicine.     You have a fever.     You are losing weight and you do not know why.     You have night sweats.    GET HELP RIGHT AWAY IF:     You cough up blood.     You have trouble breathing.     Your heartbeat is very fast.    This information is not intended to replace advice given to you by your health care provider. Make sure you discuss any questions you have with your health care provider.    Document Released: 08/23/2011 Document Revised: 08/31/2015 Document Reviewed: 02/16/2015  Elsevier  Interactive Patient Education ?2016 Elsevier Inc.

## 2020-08-15 DIAGNOSIS — R438 Other disturbances of smell and taste: Secondary | ICD-10-CM | POA: Diagnosis not present

## 2020-08-15 DIAGNOSIS — Z20828 Contact with and (suspected) exposure to other viral communicable diseases: Secondary | ICD-10-CM | POA: Diagnosis not present

## 2020-09-08 ENCOUNTER — Encounter: Payer: Self-pay | Admitting: Family Medicine

## 2020-09-08 ENCOUNTER — Other Ambulatory Visit: Payer: Self-pay

## 2020-09-08 ENCOUNTER — Ambulatory Visit (INDEPENDENT_AMBULATORY_CARE_PROVIDER_SITE_OTHER): Payer: BC Managed Care – PPO | Admitting: Family Medicine

## 2020-09-08 VITALS — BP 112/80 | HR 91 | Temp 97.7°F | Resp 16 | Ht 66.0 in | Wt 193.0 lb

## 2020-09-08 DIAGNOSIS — R634 Abnormal weight loss: Secondary | ICD-10-CM | POA: Insufficient documentation

## 2020-09-08 DIAGNOSIS — F5101 Primary insomnia: Secondary | ICD-10-CM | POA: Insufficient documentation

## 2020-09-08 DIAGNOSIS — G4700 Insomnia, unspecified: Secondary | ICD-10-CM

## 2020-09-08 DIAGNOSIS — F339 Major depressive disorder, recurrent, unspecified: Secondary | ICD-10-CM | POA: Insufficient documentation

## 2020-09-08 DIAGNOSIS — R5383 Other fatigue: Secondary | ICD-10-CM

## 2020-09-08 DIAGNOSIS — Z23 Encounter for immunization: Secondary | ICD-10-CM | POA: Diagnosis not present

## 2020-09-08 DIAGNOSIS — Z720 Tobacco use: Secondary | ICD-10-CM | POA: Diagnosis not present

## 2020-09-08 HISTORY — DX: Primary insomnia: F51.01

## 2020-09-08 HISTORY — DX: Major depressive disorder, recurrent, unspecified: F33.9

## 2020-09-08 MED ORDER — FLUTICASONE PROPIONATE 50 MCG/ACT NA SUSP: 2.0000 | Freq: Every day | NASAL | 11 refills | Status: DC

## 2020-09-08 MED ORDER — TRAZODONE HCL 100 MG PO TABS: 100.0000 mg | ORAL_TABLET | Freq: Every day | ORAL | 2 refills | Status: DC

## 2020-09-08 MED ORDER — ESCITALOPRAM OXALATE 20 MG PO TABS: 20.0000 mg | ORAL_TABLET | Freq: Every day | ORAL | 1 refills | Status: DC

## 2020-09-08 NOTE — Progress Notes (Signed)
New Patient Office Visit  Subjective:  Patient ID: Nichole Gonzalez, female    DOB: 06/04/1976  Age: 44 y.o. MRN: [id number]  CC:  Chief Complaint  Patient presents with  . Establish Care  pt re-establishing care after going to [place]-previously a patient  HPI Nichole Gonzalez presents for depression-previously seeing another physician in another state-pt saw Ms Laural Benes in the distant past. Pt is  taking lexapro 10mg  + Wellbutrin for 2 years at current dose.  Pt states medication does not seem to be working as well over the past 4-5 months.  Increase stress moving back in  from [place].  pts kids grown-pts Mom took over house and land and son came back to live in her house.  Pt states she moved back to help Mom and son. pts son 19yo, 26yo in home.  Pt states she had taken care of someone her entire life and moving away gave her independence.  Pt living in [place] before moving back.  +COVID-08/12/20 [facility], per pt recheck negative. Pt no longer has symptoms. No antibodies given   Past Surgical History:  Procedure Laterality Date  . ABDOMINAL HYSTERECTOMY    . APPENDECTOMY    . CHOLECYSTECTOMY    . JOINT REPLACEMENT    . KNEE ARTHROSCOPY    . right foot    . TUBAL LIGATION      Family History  Problem Relation Age of Onset  . Thyroid disease Sister     Social History   Socioeconomic History  . Marital status: Married    Spouse name: Not on file  . Number of children: Not on file  . Years of education: Not on file  . Highest education level: Not on file  Occupational History  . Not on file  Tobacco Use  . Smoking status: Current Every Day Smoker    Packs/day: 1.00    Types: Cigarettes  . Smokeless tobacco: Never Used  Substance and Sexual Activity  . Alcohol use: Yes  . Drug use: Never  . Sexual activity: Not on file  Other Topics Concern  . Not on file  Social History Narrative   ** Merged History Encounter **       Social Determinants of Health    Financial Resource Strain:   . Difficulty of Paying Living Expenses: Not on file  Food Insecurity:   . Worried About [facility] in the Last Year: Not on file  . Ran Out of Food in the Last Year: Not on file  Transportation Needs:   . Lack of Transportation (Medical): Not on file  . Lack of Transportation (Non-Medical): Not on file  Physical Activity:   . Days of Exercise per Week: Not on file  . Minutes of Exercise per Session: Not on file  Stress:   . Feeling of Stress : Not on file  Social Connections:   . Frequency of Communication with Friends and Family: Not on file  . Frequency of Social Gatherings with Friends and Family: Not on file  . Attends Religious Services: Not on file  . Active Member of Clubs or Organizations: Not on file  . Attends Programme researcher, broadcasting/film/video Meetings: Not on file  . Marital Status: Not on file  Intimate Partner Violence:   . Fear of Current or Ex-Partner: Not on file  . Emotionally Abused: Not on file  . Physically Abused: Not on file  . Sexually Abused: Not on file    ROS Review of  Systems  Respiratory:       OOVID+   Psychiatric/Behavioral: Positive for sleep disturbance. The patient is not nervous/anxious.        Depression    Objective:   Today's Vitals: BP 112/80   Pulse 91   Temp 97.7 F (36.5 C)   Resp 16   Ht 5\' 6"  (1.676 m)   Wt 193 lb (87.5 kg)   LMP 01/04/2016 (Approximate)   SpO2 98%   BMI 31.15 kg/m   Physical Exam Constitutional:      Appearance: Normal appearance.  HENT:     Head: Normocephalic and atraumatic.  Cardiovascular:     Rate and Rhythm: Normal rate and regular rhythm.     Pulses: Normal pulses.     Heart sounds: Normal heart sounds.  Musculoskeletal:     Cervical back: Normal range of motion and neck supple.  Neurological:     Mental Status: She is alert and oriented to person, place, and time.  Psychiatric:        Mood and Affect: Mood normal.        Behavior: Behavior normal.      Assessment & Plan:  1. Depression, recurrent (HCC) Wellbutrin-300mg  Increase-Lexapro to  20mg -seratonin symdrome d/w pt as pt taking Trazodone for sleep Conside  Counseling in addition to medication-pt to contact for EAP and insurance coverage.  2. Tobacco abuse Oral nicotine suggested to replace cig 3. Weight loss Refer to Dr. 03/03/2016 to discuss-long term follow up needed -discussed concerns about medications-risk/benefit. Pt to schedule appointment 4. Fatigue, unspecified type - CBC with Differential, TSH - Lipid Panel With LDL/HDL Ratio 5. Insomnia, unspecified type Trazodone-melatonin   Outpatient Encounter Medications as of 09/08/2020  Medication Sig  . aspirin EC 81 MG tablet Take 81 mg by mouth daily.  Marina Goodell aspirin-acetaminophen-caffeine (EXCEDRIN EXTRA STRENGTH) 250-250-65 MG per tablet Take 2 tablets by mouth every 6 (six) hours as needed for headache (and pain).  09/10/2020 buPROPion (WELLBUTRIN XL) 300 MG 24 hr tablet Take 300 mg by mouth daily.  . caffeine 200 MG TABS tablet Take 200 mg by mouth daily as needed (alertness).  Marland Kitchen dicyclomine (BENTYL) 20 MG tablet Take 10 mg by mouth in the morning and at bedtime.   Marland Kitchen EPIPEN 2-PAK 0.3 MG/0.3ML SOAJ injection   . escitalopram (LEXAPRO) 10 MG tablet Take 10 mg by mouth daily.  . fluticasone (FLONASE) 50 MCG/ACT nasal spray Place into both nostrils daily.  Marland Kitchen omeprazole (PRILOSEC) 40 MG capsule Take 40 mg by mouth daily.  . phentermine (ADIPEX-P) 37.5 MG tablet Take 37.5 mg by mouth daily before breakfast.  . traZODone (DESYREL) 100 MG tablet Take 100 mg by mouth at bedtime.  . [DISCONTINUED] ibuprofen (ADVIL,MOTRIN) 200 MG tablet Take 1,200 mg by mouth 2 (two) times daily as needed for headache or moderate pain.  . [DISCONTINUED] lidocaine (LIDODERM) 5 % Place 1 patch onto the skin daily. Remove & Discard patch within 12 hours or as directed by MD  . [DISCONTINUED] Loratadine-Pseudoephedrine (ALLERGY RELIEF-D PO) Take 1 tablet by mouth  daily as needed (allergies).   . [DISCONTINUED] methocarbamol (ROBAXIN) 500 MG tablet Take 1 tablet (500 mg total) by mouth at bedtime as needed for muscle spasms.  . [DISCONTINUED] montelukast (SINGULAIR) 10 MG tablet Take 10 mg by mouth daily with breakfast.  . [DISCONTINUED] ondansetron (ZOFRAN) 4 MG tablet Take 4 mg by mouth daily as needed for nausea or vomiting.   . [DISCONTINUED] Phentermine-Topiramate (QSYMIA) 7.5-46 MG CP24 Take 1  tablet by mouth every morning.   . [DISCONTINUED] predniSONE (STERAPRED UNI-PAK 21 TAB) 10 MG (21) TBPK tablet Take by mouth daily. Take 6 tabs by mouth daily  for 2 days, then 5 tabs for 2 days, then 4 tabs for 2 days, then 3 tabs for 2 days, 2 tabs for 2 days, then 1 tab by mouth daily for 2 days  . [DISCONTINUED] PROAIR RESPICLICK 108 (90 BASE) MCG/ACT AEPB Inhale 2 puffs into the lungs 2 (two) times daily as needed (shortness of breath).   . [DISCONTINUED] traZODone (DESYREL) 100 MG tablet Take 100 mg by mouth at bedtime.  . [DISCONTINUED] venlafaxine (EFFEXOR) 100 MG tablet Take 100 mg by mouth daily.    No facility-administered encounter medications on file as of 09/08/2020.    Follow-up: Dr. Marina Goodell to discuss weight loss options  Deverick Pruss Mat Carne, MD

## 2020-09-08 NOTE — Patient Instructions (Addendum)
Fasting labwork F/u appt to discuss weight loss options-Dr.Perry Increase dose of Lexapro to 20mg  daily Consider counseling-check EAP program at 

## 2020-09-12 ENCOUNTER — Other Ambulatory Visit: Payer: Self-pay

## 2020-09-12 ENCOUNTER — Ambulatory Visit: Payer: BC Managed Care – PPO

## 2020-09-12 ENCOUNTER — Other Ambulatory Visit: Payer: Self-pay | Admitting: Family Medicine

## 2020-09-12 DIAGNOSIS — E661 Drug-induced obesity: Secondary | ICD-10-CM

## 2020-09-12 DIAGNOSIS — R5383 Other fatigue: Secondary | ICD-10-CM

## 2020-09-12 DIAGNOSIS — Z6831 Body mass index (BMI) 31.0-31.9, adult: Secondary | ICD-10-CM | POA: Diagnosis not present

## 2020-09-13 LAB — CBC WITH DIFFERENTIAL/PLATELET
Basophils Absolute: 0.1 10*3/uL (ref 0.0–0.2)
Basos: 1 %
EOS (ABSOLUTE): 0.2 10*3/uL (ref 0.0–0.4)
Eos: 1 %
Hematocrit: 42.9 % (ref 34.0–46.6)
Hemoglobin: 14.8 g/dL (ref 11.1–15.9)
Immature Grans (Abs): 0 10*3/uL (ref 0.0–0.1)
Immature Granulocytes: 0 %
Lymphocytes Absolute: 4.4 10*3/uL — ABNORMAL HIGH (ref 0.7–3.1)
Lymphs: 30 %
MCH: 33.1 pg — ABNORMAL HIGH (ref 26.6–33.0)
MCHC: 34.5 g/dL (ref 31.5–35.7)
MCV: 96 fL (ref 79–97)
Monocytes Absolute: 0.7 10*3/uL (ref 0.1–0.9)
Monocytes: 5 %
Neutrophils Absolute: 9.4 10*3/uL — ABNORMAL HIGH (ref 1.4–7.0)
Neutrophils: 63 %
Platelets: 270 10*3/uL (ref 150–450)
RBC: 4.47 x10E6/uL (ref 3.77–5.28)
RDW: 11.8 % (ref 11.7–15.4)
WBC: 14.7 10*3/uL — ABNORMAL HIGH (ref 3.4–10.8)

## 2020-09-13 LAB — COMPREHENSIVE METABOLIC PANEL
ALT: 16 IU/L (ref 0–32)
AST: 12 IU/L (ref 0–40)
Albumin/Globulin Ratio: 1.7 (ref 1.2–2.2)
Albumin: 4.3 g/dL (ref 3.8–4.8)
Alkaline Phosphatase: 86 IU/L (ref 44–121)
BUN/Creatinine Ratio: 10 (ref 9–23)
BUN: 8 mg/dL (ref 6–24)
Bilirubin Total: <0.2 mg/dL (ref 0.0–1.2)
CO2: 23 mmol/L (ref 20–29)
Calcium: 10.1 mg/dL (ref 8.7–10.2)
Chloride: 99 mmol/L (ref 96–106)
Creatinine, Ser: 0.79 mg/dL (ref 0.57–1.00)
GFR calc Af Amer: 105 mL/min/{1.73_m2} (ref >59)
GFR calc non Af Amer: 91 mL/min/{1.73_m2} (ref >59)
Globulin, Total: 2.6 g/dL (ref 1.5–4.5)
Glucose: 97 mg/dL (ref 65–99)
Potassium: 4.1 mmol/L (ref 3.5–5.2)
Sodium: 137 mmol/L (ref 134–144)
Total Protein: 6.9 g/dL (ref 6.0–8.5)

## 2020-09-13 LAB — TSH: TSH: 1.4 u[IU]/mL (ref 0.450–4.500)

## 2020-09-13 LAB — LIPID PANEL
Chol/HDL Ratio: 5.4 ratio — ABNORMAL HIGH (ref 0.0–4.4)
Cholesterol, Total: 211 mg/dL — ABNORMAL HIGH (ref 100–199)
HDL: 39 mg/dL — ABNORMAL LOW (ref >39)
LDL Chol Calc (NIH): 124 mg/dL — ABNORMAL HIGH (ref 0–99)
Triglycerides: 269 mg/dL — ABNORMAL HIGH (ref 0–149)
VLDL Cholesterol Cal: 48 mg/dL — ABNORMAL HIGH (ref 5–40)

## 2020-09-13 LAB — CARDIOVASCULAR RISK ASSESSMENT

## 2020-09-14 ENCOUNTER — Encounter: Payer: Self-pay | Admitting: Family Medicine

## 2020-09-15 ENCOUNTER — Other Ambulatory Visit: Payer: Self-pay | Admitting: Family Medicine

## 2020-09-15 DIAGNOSIS — D72828 Other elevated white blood cell count: Secondary | ICD-10-CM

## 2020-09-15 NOTE — Telephone Encounter (Signed)
I will put referral in. You should hear from [place] sometime next week. Dr. Sedalia Muta

## 2020-10-07 ENCOUNTER — Encounter: Payer: Self-pay | Admitting: Family Medicine

## 2020-10-07 ENCOUNTER — Ambulatory Visit: Payer: BC Managed Care – PPO | Admitting: Family Medicine

## 2020-10-07 ENCOUNTER — Other Ambulatory Visit: Payer: Self-pay

## 2020-10-07 VITALS — BP 114/78 | HR 73 | Temp 97.5°F | Ht 66.0 in | Wt 193.4 lb

## 2020-10-07 DIAGNOSIS — M7541 Impingement syndrome of right shoulder: Secondary | ICD-10-CM

## 2020-10-07 DIAGNOSIS — N939 Abnormal uterine and vaginal bleeding, unspecified: Secondary | ICD-10-CM | POA: Insufficient documentation

## 2020-10-07 DIAGNOSIS — E6609 Other obesity due to excess calories: Secondary | ICD-10-CM

## 2020-10-07 DIAGNOSIS — F331 Major depressive disorder, recurrent, moderate: Secondary | ICD-10-CM

## 2020-10-07 DIAGNOSIS — E782 Mixed hyperlipidemia: Secondary | ICD-10-CM | POA: Diagnosis not present

## 2020-10-07 DIAGNOSIS — R82998 Other abnormal findings in urine: Secondary | ICD-10-CM

## 2020-10-07 DIAGNOSIS — D72828 Other elevated white blood cell count: Secondary | ICD-10-CM | POA: Diagnosis not present

## 2020-10-07 DIAGNOSIS — N3000 Acute cystitis without hematuria: Secondary | ICD-10-CM | POA: Diagnosis not present

## 2020-10-07 DIAGNOSIS — Z720 Tobacco use: Secondary | ICD-10-CM

## 2020-10-07 DIAGNOSIS — Z6831 Body mass index (BMI) 31.0-31.9, adult: Secondary | ICD-10-CM

## 2020-10-07 HISTORY — DX: Abnormal uterine and vaginal bleeding, unspecified: N93.9

## 2020-10-07 LAB — POCT URINALYSIS DIPSTICK
Bilirubin, UA: NEGATIVE
Blood, UA: NEGATIVE
Glucose, UA: NEGATIVE
Ketones, UA: NEGATIVE
Nitrite, UA: NEGATIVE
Protein, UA: NEGATIVE
Spec Grav, UA: 1.01 (ref 1.010–1.025)
Urobilinogen, UA: NEGATIVE E.U./dL — AB
pH, UA: 5 (ref 5.0–8.0)

## 2020-10-07 MED ORDER — ROSUVASTATIN CALCIUM 10 MG PO TABS: 10.0000 mg | ORAL_TABLET | Freq: Every day | ORAL | 0 refills | Status: DC

## 2020-10-07 MED ORDER — MELOXICAM 15 MG PO TABS: 15.0000 mg | ORAL_TABLET | Freq: Every day | ORAL | 0 refills | Status: DC

## 2020-10-07 NOTE — Patient Instructions (Addendum)
Shoulder: meloxicam 15 mg once daily.  High cholesterol: crestor 10 mg once daily at night Weight: Start on phentermine 37.5 mg  Tobacco Use Disorder: recommend nicotine supplement   Tobacco use disorder (TUD) occurs when a person craves, seeks, and uses tobacco, regardless of the consequences. This disorder can cause problems with mental and physical health. It can affect your ability to have healthy relationships, and it can keep you from meeting your responsibilities at work, home, or school. Tobacco may be:  Smoked as a cigarette or cigar.  Inhaled using e-cigarettes.  Smoked in a pipe or hookah.  Chewed as smokeless tobacco.  Inhaled into the nostrils as snuff. Tobacco products contain a dangerous chemical called nicotine, which is very addictive. Nicotine triggers hormones that make the body feel stimulated and works on areas of the brain that make you feel good. These effects can make it hard for people to quit nicotine. Tobacco contains many other unsafe chemicals that can damage almost every organ in the body. Smoking tobacco also puts others in danger due to fire risk and possible health problems caused by breathing in secondhand smoke. What are the signs or symptoms? Symptoms of TUD may include:  Being unable to slow down or stop your tobacco use.  Spending an abnormal amount of time getting or using tobacco.  Craving tobacco. Cravings may last for up to 6 months after quitting.  Tobacco use that: ? Interferes with your work, school, or home life. ? Interferes with your personal and social relationships. ? Makes you give up activities that you once enjoyed or found important.  Using tobacco even though you know that it is: ? Dangerous or bad for your health or someone else's health. ? Causing problems in your life.  Needing more and more of the substance to get the same effect (developing tolerance).  Experiencing unpleasant symptoms if you do not use the substance  (withdrawal). Withdrawal symptoms may include: ? Depressed, anxious, or irritable mood. ? Difficulty concentrating. ? Increased appetite. ? Restlessness or trouble sleeping.  Using the substance to avoid withdrawal. How is this diagnosed? This condition may be diagnosed based on:  Your current and past tobacco use. Your health care provider may ask questions about how your tobacco use affects your life.  A physical exam. You may be diagnosed with TUD if you have at least two symptoms within a 82-month period. How is this treated? This condition is treated by stopping tobacco use. Many people are unable to quit on their own and need help. Treatment may include:  Nicotine replacement therapy (NRT). NRT provides nicotine without the other harmful chemicals in tobacco. NRT gradually lowers the dosage of nicotine in the body and reduces withdrawal symptoms. NRT is available as: ? Over-the-counter gums, lozenges, and skin patches. ? Prescription mouth inhalers and nasal sprays.  Medicine that acts on the brain to reduce cravings and withdrawal symptoms.  A type of talk therapy that examines your triggers for tobacco use, how to avoid them, and how to cope with cravings (behavioral therapy).  Hypnosis. This may help with withdrawal symptoms.  Joining a support group for others coping with TUD. The best treatment for TUD is usually a combination of medicine, talk therapy, and support groups. Recovery can be a long process. Many people start using tobacco again after stopping (relapse). If you relapse, it does not mean that treatment will not work. Follow these instructions at home:  Lifestyle  Do not use any products that contain nicotine  or tobacco, such as cigarettes and e-cigarettes.  Avoid things that trigger tobacco use as much as you can. Triggers include people and situations that usually cause you to use tobacco.  Avoid drinks that contain caffeine, including coffee. These may  worsen some withdrawal symptoms.  Find ways to manage stress. Wanting to smoke may cause stress, and stress can make you want to smoke. Relaxation techniques such as deep breathing, meditation, and yoga may help.  Attend support groups as needed. These groups are an important part of long-term recovery for many people. General instructions  Take over-the-counter and prescription medicines only as told by your health care provider.  Check with your health care provider before taking any new prescription or over-the-counter medicines.  Decide on a friend, family member, or smoking quit-line (such as 1-800-QUIT-NOW in the [place] that you can call or text when you feel the urge to smoke or when you need help coping with cravings.  Keep all follow-up visits as told by your health care provider and therapist. This is important. Contact a health care provider if:  You are not able to take your medicines as prescribed.  Your symptoms get worse, even with treatment. Summary  Tobacco use disorder (TUD) occurs when a person craves, seeks, and uses tobacco regardless of the consequences.  This condition may be diagnosed based on your current and past tobacco use and a physical exam.  Many people are unable to quit on their own and need help. Recovery can be a long process.  The most effective treatment for TUD is usually a combination of medicine, talk therapy, and support groups. This information is not intended to replace advice given to you by your health care provider. Make sure you discuss any questions you have with your health care provider. Document Revised: 11/27/2017 Document Reviewed: 11/27/2017 Elsevier Patient Education  2020 [employer] Secondary Shoulder Impingement Syndrome Rehab Ask your health care provider which exercises are safe for you. Do exercises exactly as told by your health care provider and adjust them as directed. It is normal to feel mild stretching, pulling,  tightness, or discomfort as you do these exercises. Stop right away if you feel sudden pain or your pain gets worse. Do not begin these exercises until told by your health care provider. Stretching and range-of-motion exercise This exercise warms up your muscles and joints and improves the movement and flexibility of your neck and shoulder. This exercise also helps to relieve pain and stiffness. Cervical side bend  1. Using good posture, sit on a stable chair, or stand up. 2. Without moving your shoulders, slowly tilt your left / right ear toward your left / right shoulder until you feel a stretch in your neck (cervical) muscles on the other side. You should be looking straight ahead. 3. Hold for __________ seconds. 4. Slowly return to the starting position. 5. Repeat the stretch on your left / right side. Repeat __________ times. Complete this exercise __________ times a day. Strengthening exercises These exercises build strength and endurance in your shoulder. Endurance is the ability to use your muscles for a long time, even after they get tired. Scapular protraction, supine  1. Lie on your back on a firm surface (supine position). Hold a __________ weight in your left / right hand. 2. Raise your left / right arm straight into the air so your hand is directly above your shoulder joint. 3. Push the weight into the air so your shoulder (scapula) lifts off the surface  that you are lying on. The scapula will push up or forward (protraction). Do not move your head, neck, or back. 4. Hold for __________ seconds. 5. Slowly return to the starting position. Let your muscles relax completely before you repeat this exercise. Repeat __________ times. Complete this exercise __________ times a day. Scapular retraction  1. Sit in a stable chair without armrests, or stand up. 2. Secure an exercise band to a stable object in front of you so the band is at shoulder height. 3. Hold one end of the exercise  band in each hand. Your palms should face down. 4. Squeeze your shoulder blades (scapulae) together and move your elbows slightly behind you (retraction). Do not shrug your shoulders upward while you do this. 5. Hold for __________ seconds. 6. Slowly return to the starting position. Repeat __________ times. Complete this exercise __________ times a day. Shoulder extension with scapular retraction  1. Sit in a stable chair without armrests, or stand up. 2. Secure an exercise band to a stable object in front of you so the band is above shoulder height. 3. Hold one end of the exercise band in each hand. 4. Straighten your elbows and lift your hands up to shoulder height. 5. Squeeze your shoulder blades together (scapular retraction) and pull your hands down to the sides of your thighs (shoulder extension). Stop when your hands are straight down by your sides. Do not let your hands go behind your body. 6. Hold for __________ seconds. 7. Slowly return to the starting position. Repeat __________ times. Complete this exercise __________ times a day. Shoulder abduction 1. Sit in a stable chair without armrests, or stand up. 2. If directed, hold a __________ weight in your left / right hand. 3. Start with your arms straight down. Turn your left / right hand so your palm faces in, toward your body. 4. Slowly lift your left / right hand out to your side (abduction). Do not lift your hand above shoulder height. ? Keep your arms straight. ? Avoid shrugging your shoulder while you do this movement. Keep your shoulder blade tucked down toward the middle of your back. 5. Hold for __________ seconds. 6. Slowly lower your arm, and return to the starting position. Repeat __________ times. Complete this exercise __________ times a day. This information is not intended to replace advice given to you by your health care provider. Make sure you discuss any questions you have with your health care  provider. Document Revised: 04/03/2019 Document Reviewed: 01/15/2019 Elsevier Patient Education  2020 [employer] Fat and Cholesterol Restricted Eating Plan Getting too much fat and cholesterol in your diet may cause health problems. Choosing the right foods helps keep your fat and cholesterol at normal levels. This can keep you from getting certain diseases. Your doctor may recommend an eating plan that includes:  Total fat: ______% or less of total calories a day.  Saturated fat: ______% or less of total calories a day.  Cholesterol: less than _________mg a day.  Fiber: ______g a day. What are tips for following this plan? Meal planning  At meals, divide your plate into four equal parts: ? Fill one-half of your plate with vegetables and green salads. ? Fill one-fourth of your plate with whole grains. ? Fill one-fourth of your plate with low-fat (lean) protein foods.  Eat fish that is high in omega-3 fats at least two times a week. This includes mackerel, tuna, sardines, and salmon.  Eat foods that are high in fiber,  such as whole grains, beans, apples, broccoli, carrots, peas, and barley. General tips   Work with your doctor to lose weight if you need to.  Avoid: ? Foods with added sugar. ? Fried foods. ? Foods with partially hydrogenated oils.  Limit alcohol intake to no more than 1 drink a day for nonpregnant women and 2 drinks a day for men. One drink equals 12 oz of beer, 5 oz of wine, or 1 oz of hard liquor. Reading food labels  Check food labels for: ? Trans fats. ? Partially hydrogenated oils. ? Saturated fat (g) in each serving. ? Cholesterol (mg) in each serving. ? Fiber (g) in each serving.  Choose foods with healthy fats, such as: ? Monounsaturated fats. ? Polyunsaturated fats. ? Omega-3 fats.  Choose grain products that have whole grains. Look for the word "whole" as the first word in the ingredient list. Cooking  Cook foods using low-fat  methods. These include baking, boiling, grilling, and broiling.  Eat more home-cooked foods. Eat at restaurants and buffets less often.  Avoid cooking using saturated fats, such as butter, cream, palm oil, palm kernel oil, and coconut oil. Recommended foods  Fruits  All fresh, canned (in natural juice), or frozen fruits. Vegetables  Fresh or frozen vegetables (raw, steamed, roasted, or grilled). Green salads. Grains  Whole grains, such as whole wheat or whole grain breads, crackers, cereals, and pasta. Unsweetened oatmeal, bulgur, barley, quinoa, or brown rice. Corn or whole wheat flour tortillas. Meats and other protein foods  Ground beef (85% or leaner), grass-fed beef, or beef trimmed of fat. Skinless chicken or [place]. Ground chicken or [place]. Pork trimmed of fat. All fish and seafood. Egg whites. Dried beans, peas, or lentils. Unsalted nuts or seeds. Unsalted canned beans. Nut butters without added sugar or oil. Dairy  Low-fat or nonfat dairy products, such as skim or 1% milk, 2% or reduced-fat cheeses, low-fat and fat-free ricotta or cottage cheese, or plain low-fat and nonfat yogurt. Fats and oils  Tub margarine without trans fats. Light or reduced-fat mayonnaise and salad dressings. Avocado. Olive, canola, sesame, or safflower oils. The items listed above may not be a complete list of foods and beverages you can eat. Contact a dietitian for more information. Foods to avoid Fruits  Canned fruit in heavy syrup. Fruit in cream or butter sauce. Fried fruit. Vegetables  Vegetables cooked in cheese, cream, or butter sauce. Fried vegetables. Grains  White bread. White pasta. White rice. Cornbread. Bagels, pastries, and croissants. Crackers and snack foods that contain trans fat and hydrogenated oils. Meats and other protein foods  Fatty cuts of meat. Ribs, chicken wings, bacon, sausage, bologna, salami, chitterlings, fatback, hot dogs, bratwurst, and packaged lunch meats.  Liver and organ meats. Whole eggs and egg yolks. Chicken and [place] with skin. Fried meat. Dairy  Whole or 2% milk, cream, half-and-half, and cream cheese. Whole milk cheeses. Whole-fat or sweetened yogurt. Full-fat cheeses. Nondairy creamers and whipped toppings. Processed cheese, cheese spreads, and cheese curds. Beverages  Alcohol. Sugar-sweetened drinks such as sodas, lemonade, and fruit drinks. Fats and oils  Butter, stick margarine, lard, shortening, ghee, or bacon fat. Coconut, palm kernel, and palm oils. Sweets and desserts  Corn syrup, sugars, honey, and molasses. Candy. Jam and jelly. Syrup. Sweetened cereals. Cookies, pies, cakes, donuts, muffins, and ice cream. The items listed above may not be a complete list of foods and beverages you should avoid. Contact a dietitian for more information. Summary  Choosing the right foods  helps keep your fat and cholesterol at normal levels. This can keep you from getting certain diseases.  At meals, fill one-half of your plate with vegetables and green salads.  Eat high-fiber foods, like whole grains, beans, apples, carrots, peas, and barley.  Limit added sugar, saturated fats, alcohol, and fried foods. This information is not intended to replace advice given to you by your health care provider. Make sure you discuss any questions you have with your health care provider. Document Revised: 08/13/2018 Document Reviewed: 08/27/2017 Elsevier Patient Education  2020 [employer].

## 2020-10-07 NOTE — Progress Notes (Signed)
Subjective:  Patient ID: Nichole Gonzalez, female    DOB: May 30, 1976  Age: 44 y.o. MRN: [id number]  Chief Complaint  Patient presents with  . Depression    HPI Mixed hyperlipidemia  Pt presents with hyperlipidemia. Compliance with treatment has been good. Taking crestor 10 mg once daily at night. The patient is compliant with medications, maintains a low cholesterol diet , follows up as directed , and maintains an exercise regimen . Eating twice a day. The patient denies experiencing any hypercholesterolemia related symptoms.   Shoulder pain - Left shoulder pain. Excedrin helps.   Obesity with bmi 31. Pt would like to try phentermine again. Pt is eating healthy and exercise.   Depression: Increased lexapro 20 mg once daily at his last visit. Taking wellbutrin xl 300 mg once daily in am. Taking trazodone 50 mg once daily at night.  Current Outpatient Medications on File Prior to Visit  Medication Sig Dispense Refill  . aspirin EC 81 MG tablet Take 81 mg by mouth daily.    Marland Kitchen aspirin-acetaminophen-caffeine (EXCEDRIN EXTRA STRENGTH) 250-250-65 MG per tablet Take 2 tablets by mouth every 6 (six) hours as needed for headache (and pain).    Marland Kitchen buPROPion (WELLBUTRIN XL) 300 MG 24 hr tablet Take 300 mg by mouth daily.    . caffeine 200 MG TABS tablet Take 200 mg by mouth daily as needed (alertness).    Marland Kitchen dicyclomine (BENTYL) 20 MG tablet Take 10 mg by mouth in the morning and at bedtime.   1  . EPIPEN 2-PAK 0.3 MG/0.3ML SOAJ injection   0  . escitalopram (LEXAPRO) 20 MG tablet Take 1 tablet (20 mg total) by mouth daily. 30 tablet 1  . fluticasone (FLONASE) 50 MCG/ACT nasal spray Place 2 sprays into both nostrils daily. 16 mL 11  . omeprazole (PRILOSEC) 40 MG capsule Take 40 mg by mouth daily.    . phentermine (ADIPEX-P) 37.5 MG tablet Take 37.5 mg by mouth daily before breakfast.    . traZODone (DESYREL) 100 MG tablet Take 1 tablet (100 mg total) by mouth at bedtime. 30 tablet 2   No  current facility-administered medications on file prior to visit.   History reviewed. No pertinent past medical history. Past Surgical History:  Procedure Laterality Date  . ABDOMINAL HYSTERECTOMY    . APPENDECTOMY    . CHOLECYSTECTOMY    . JOINT REPLACEMENT    . KNEE ARTHROSCOPY    . right foot    . TUBAL LIGATION      Family History  Problem Relation Age of Onset  . Thyroid disease Sister    Social History   Socioeconomic History  . Marital status: Married    Spouse name: Not on file  . Number of children: Not on file  . Years of education: Not on file  . Highest education level: Not on file  Occupational History  . Not on file  Tobacco Use  . Smoking status: Current Every Day Smoker    Packs/day: 1.00    Types: Cigarettes  . Smokeless tobacco: Never Used  Substance and Sexual Activity  . Alcohol use: Yes  . Drug use: Never  . Sexual activity: Not on file  Other Topics Concern  . Not on file  Social History Narrative   ** Merged History Encounter **       Social Determinants of Health   Financial Resource Strain:   . Difficulty of Paying Living Expenses: Not on file  Food Insecurity:   .  Worried About Programme researcher, broadcasting/film/video in the Last Year: Not on file  . Ran Out of Food in the Last Year: Not on file  Transportation Needs:   . Lack of Transportation (Medical): Not on file  . Lack of Transportation (Non-Medical): Not on file  Physical Activity:   . Days of Exercise per Week: Not on file  . Minutes of Exercise per Session: Not on file  Stress:   . Feeling of Stress : Not on file  Social Connections:   . Frequency of Communication with Friends and Family: Not on file  . Frequency of Social Gatherings with Friends and Family: Not on file  . Attends Religious Services: Not on file  . Active Member of Clubs or Organizations: Not on file  . Attends Banker Meetings: Not on file  . Marital Status: Not on file    Review of Systems    Constitutional: Positive for fatigue (severe) and unexpected weight change. Negative for chills and fever.  HENT: Negative for congestion, ear pain and sore throat.   Respiratory: Negative for cough and shortness of breath.   Cardiovascular: Negative for chest pain.  Gastrointestinal: Negative for abdominal pain, constipation, diarrhea, nausea and vomiting.  Endocrine: Positive for polydipsia.  Genitourinary: Negative for dysuria and urgency.  Musculoskeletal: Positive for arthralgias (left shoulder pain. severe. took excedrin which helped some. ), back pain (lower back pain.) and myalgias.  Skin: Negative for rash.  Neurological: Negative for dizziness and headaches.  Psychiatric/Behavioral: Negative for dysphoric mood. The patient is not nervous/anxious.      Objective:  BP 114/78 (BP Location: Right Arm, Patient Position: Sitting, Cuff Size: Large)   Pulse 73   Temp (!) 97.5 F (36.4 C) (Temporal)   Ht 5\' 6"  (1.676 m)   Wt 193 lb 6.4 oz (87.7 kg)   LMP 01/04/2016 (Approximate)   SpO2 98%   BMI 31.22 kg/m   BP/Weight 10/07/2020 09/08/2020 08/27/2018  Systolic BP 114 112 -  Diastolic BP 78 80 -  Wt. (Lbs) 193.4 193 195  BMI 31.22 31.15 31.47    Physical Exam Vitals reviewed.  Constitutional:      Appearance: Normal appearance. She is normal weight.  HENT:     Right Ear: Tympanic membrane, ear canal and external ear normal.     Left Ear: Tympanic membrane, ear canal and external ear normal.     Nose: Nose normal.     Mouth/Throat:     Pharynx: Oropharynx is clear.  Neck:     Vascular: No carotid bruit.  Cardiovascular:     Rate and Rhythm: Normal rate and regular rhythm.     Pulses: Normal pulses.     Heart sounds: Normal heart sounds. No murmur heard.   Pulmonary:     Effort: Pulmonary effort is normal. No respiratory distress.     Breath sounds: Normal breath sounds.  Abdominal:     General: Abdomen is flat. Bowel sounds are normal.     Palpations: Abdomen is  soft.     Tenderness: There is no abdominal tenderness.  Musculoskeletal:        General: Tenderness (anterior shoulder. abduction limted. external rotation) present.  Neurological:     Mental Status: She is alert and oriented to person, place, and time.  Psychiatric:        Mood and Affect: Mood normal.        Behavior: Behavior normal.      Lab Results  Component Value Date  WBC 14.7 (H) 09/12/2020   HGB 14.8 09/12/2020   HCT 42.9 09/12/2020   PLT 270 09/12/2020   GLUCOSE 97 09/12/2020   CHOL 211 (H) 09/12/2020   TRIG 269 (H) 09/12/2020   HDL 39 (L) 09/12/2020   LDLCALC 124 (H) 09/12/2020   ALT 16 09/12/2020   AST 12 09/12/2020   NA 137 09/12/2020   K 4.1 09/12/2020   CL 99 09/12/2020   CREATININE 0.79 09/12/2020   BUN 8 09/12/2020   CO2 23 09/12/2020   TSH 1.400 09/12/2020      Assessment & Plan:   1. Coracoid impingement of right shoulder Start on meloxicam. - meloxicam (MOBIC) 15 MG tablet; Take 1 tablet (15 mg total) by mouth daily.  Dispense: 30 tablet; Refill: 0 - DG Shoulder Right  2. Other elevated white blood cell (WBC) count  Reassurance given. Recommended quit smoking.  3. Mixed hyperlipidemia Not at goal. Start on crestor 10 mg once daily.  Continue to work on eating a healthy diet and exercise.  Labs drawn today.  - rosuvastatin (CRESTOR) 10 MG tablet; Take 1 tablet (10 mg total) by mouth daily.  Dispense: 90 tablet; Refill: 0  4. Class 1 obesity due to excess calories with serious comorbidity and body mass index (BMI) of 31.0 to 31.9 in adult Recommend continue to work on eating healthy diet and exercise. Start on phentermine 37.5 mg once daily in am.   5. WBCs in urine - POCT urinalysis dipstick - Urine Culture   6. Major Depression, recurrent, moderate The current medical regimen is effective;  continue present plan and medications. Improved.  7. Tobacco disorder Nicoderm supplement. Meds ordered this encounter  Medications  .  meloxicam (MOBIC) 15 MG tablet    Sig: Take 1 tablet (15 mg total) by mouth daily.    Dispense:  30 tablet    Refill:  0  . rosuvastatin (CRESTOR) 10 MG tablet    Sig: Take 1 tablet (10 mg total) by mouth daily.    Dispense:  90 tablet    Refill:  0    Orders Placed This Encounter  Procedures  . Urine Culture  . DG Shoulder Right  . POCT urinalysis dipstick     Follow-up: Return in about 3 months (around 01/07/2021) for fasting.  An After Visit Summary was printed and given to the patient.  Blane Ohara [facility] [phone]

## 2020-10-10 LAB — URINE CULTURE

## 2020-10-13 ENCOUNTER — Ambulatory Visit: Payer: BC Managed Care – PPO

## 2020-10-14 ENCOUNTER — Other Ambulatory Visit: Payer: Self-pay | Admitting: Family Medicine

## 2020-10-18 ENCOUNTER — Encounter: Payer: Self-pay | Admitting: Family Medicine

## 2020-10-24 ENCOUNTER — Encounter: Payer: Self-pay | Admitting: Family Medicine

## 2020-10-26 ENCOUNTER — Encounter: Payer: Self-pay | Admitting: Family Medicine

## 2020-10-27 ENCOUNTER — Encounter: Payer: Self-pay | Admitting: Family Medicine

## 2020-10-27 ENCOUNTER — Telehealth (INDEPENDENT_AMBULATORY_CARE_PROVIDER_SITE_OTHER): Payer: BC Managed Care – PPO | Admitting: Family Medicine

## 2020-10-27 VITALS — Ht 66.0 in | Wt 193.0 lb

## 2020-10-27 DIAGNOSIS — D72829 Elevated white blood cell count, unspecified: Secondary | ICD-10-CM | POA: Diagnosis not present

## 2020-10-27 DIAGNOSIS — K219 Gastro-esophageal reflux disease without esophagitis: Secondary | ICD-10-CM

## 2020-10-27 DIAGNOSIS — Z720 Tobacco use: Secondary | ICD-10-CM | POA: Diagnosis not present

## 2020-10-27 DIAGNOSIS — K921 Melena: Secondary | ICD-10-CM | POA: Diagnosis not present

## 2020-10-27 HISTORY — DX: Gastro-esophageal reflux disease without esophagitis: K21.9

## 2020-10-27 MED ORDER — FAMOTIDINE 40 MG PO TABS: 40.0000 mg | ORAL_TABLET | Freq: Every day | ORAL | 1 refills | Status: DC

## 2020-10-27 MED ORDER — DEXILANT 60 MG PO CPDR: 60.0000 mg | DELAYED_RELEASE_CAPSULE | Freq: Every day | ORAL | 1 refills | Status: DC

## 2020-10-27 NOTE — Progress Notes (Signed)
Virtual Visit via Telephone Note  I connected with Nichole Gonzalez on 10/27/20 at  9:30 AM EDT by telephone and verified that I am speaking with the correct person using two identifiers.DOB/address  Location: Patient: home Provider: clinic   I discussed the limitations, risks, security and privacy concerns of performing an evaluation and management service by telephone and the availability of in person appointments. I also discussed with the patient that there may be a patient responsible charge related to this service. The patient expressed understanding and agreed to proceed.   History of Present Illness: A few weeks ago-blood noted in stool with wiping. Pt states she was not worried. Pt states she had a bad taste in mouth with eating.  Pt states on Monday she had a BM and it smelled like the same odor she was tasting in her mouth. IBS diagnosis with bentyl in the past. Gallbladder removed with ongoing changes in bowels. Pt has taken omeprazole for at least 12 years daily. Pt states she took pepcid in the past but not recently. Pt states she had an upper endoscopy at least 5 years ago-pt states she has ulcers. Pt was seen by hematology in the past for elevated white count. They told her no concerns- Hgb13 at the time of hematology visit. Pt has not changed her diet or water source and no recent travel.    Observations/Objective: No vital signs  Assessment and Plan: 1. Leukocytosis, unspecified type Elevated in the past-no work up in the past - Ambulatory referral to Hematology  2. Tobacco abuse Encouraged pt to quit smoking  3. Gastroesophageal reflux disease, unspecified whether esophagitis present Increase frequency of omeprazole-40mg  to BID until -increase potency of PPI to Dexilant to 40mg  daily + pepcid 61m daily. Avoid caffeine, nicotine and spicy foods.  Reviewed labwork 9/21 4. Blood in stool GI referral -concern for ulcers, gastritis and blood in stool   Follow Up  Instructions: appt next week for follow up with labwork, heme test    I discussed the assessment and treatment plan with the patient. The patient was provided an opportunity to ask questions and all were answered. The patient agreed with the plan and demonstrated an understanding of the instructions.   The patient was advised to seek an in-person evaluation-appt needed next week for labwork and heme test.  I provided 14 minutes of non-face-to-face time during this encounter.   Nichole Gonzalez 10/21, MD

## 2020-11-01 ENCOUNTER — Encounter: Payer: Self-pay | Admitting: Family Medicine

## 2020-11-01 ENCOUNTER — Ambulatory Visit: Payer: BC Managed Care – PPO | Admitting: Family Medicine

## 2020-11-01 ENCOUNTER — Other Ambulatory Visit: Payer: Self-pay

## 2020-11-01 VITALS — BP 116/72 | HR 84 | Temp 97.9°F | Ht 66.0 in | Wt 190.2 lb

## 2020-11-01 DIAGNOSIS — R438 Other disturbances of smell and taste: Secondary | ICD-10-CM | POA: Diagnosis not present

## 2020-11-01 DIAGNOSIS — K219 Gastro-esophageal reflux disease without esophagitis: Secondary | ICD-10-CM

## 2020-11-01 DIAGNOSIS — R1032 Left lower quadrant pain: Secondary | ICD-10-CM | POA: Diagnosis not present

## 2020-11-01 DIAGNOSIS — D72829 Elevated white blood cell count, unspecified: Secondary | ICD-10-CM | POA: Diagnosis not present

## 2020-11-01 DIAGNOSIS — K921 Melena: Secondary | ICD-10-CM | POA: Diagnosis not present

## 2020-11-01 DIAGNOSIS — R5383 Other fatigue: Secondary | ICD-10-CM

## 2020-11-01 DIAGNOSIS — Z7251 High risk heterosexual behavior: Secondary | ICD-10-CM

## 2020-11-01 DIAGNOSIS — R110 Nausea: Secondary | ICD-10-CM | POA: Diagnosis not present

## 2020-11-01 DIAGNOSIS — D7389 Other diseases of spleen: Secondary | ICD-10-CM | POA: Diagnosis not present

## 2020-11-01 DIAGNOSIS — N281 Cyst of kidney, acquired: Secondary | ICD-10-CM | POA: Diagnosis not present

## 2020-11-01 LAB — POCT URINALYSIS DIP (CLINITEK)
Bilirubin, UA: NEGATIVE
Glucose, UA: NEGATIVE mg/dL
Ketones, POC UA: NEGATIVE mg/dL
Nitrite, UA: NEGATIVE
POC PROTEIN,UA: NEGATIVE
Spec Grav, UA: 1.01 (ref 1.010–1.025)
Urobilinogen, UA: 0.2 E.U./dL
pH, UA: 6 (ref 5.0–8.0)

## 2020-11-01 MED ORDER — PANTOPRAZOLE SODIUM 40 MG PO TBEC: 40.0000 mg | DELAYED_RELEASE_TABLET | Freq: Every day | ORAL | 3 refills | Status: DC

## 2020-11-01 NOTE — Progress Notes (Addendum)
 Established Patient Office Visit  Subjective:  Patient ID: Nichole Gonzalez, female    DOB: 03/25/1976  Age: 44 y.o. MRN: [id number]  CC:  Chief Complaint  Patient presents with  . Abdominal Pain    bad taste    HPI Nichole Gonzalez presents for pecid BID + omeprazole BID -taste has not improved. "Taste is so bad I can not eat".  "Nothing taste right". Sweets improves taste.  Strong seasoning helps taste in mouth.  Pt has noted dark stool, when loose-dark and watery.  Change in stool-bloody noted with wiping-bright red blood. Abdominal pain-left lower quad  Pain-at rest or with standing.  No burning with urination.  D and C previously , hysterectomy, appendix and gallbladder removed. No resection of colon.  [place] diagnosed ulcer disease-No diagnosis of H pylori.  Pt given zpack + antiacids. With stress burning.  Elevated WBC in the past. Pt has tried omeprazole and pepcid in the past. Fever at home over the last several days  Past Surgical History:  Procedure Laterality Date  . ABDOMINAL HYSTERECTOMY    . APPENDECTOMY    . CHOLECYSTECTOMY    . JOINT REPLACEMENT    . KNEE ARTHROSCOPY    . right foot    . TUBAL LIGATION      Family History  Problem Relation Age of Onset  . Thyroid disease Sister     Social History   Socioeconomic History  . Marital status: Married    Spouse name: Not on file  . Number of children: Not on file  . Years of education: Not on file  . Highest education level: Not on file  Occupational History  . Not on file  Tobacco Use  . Smoking status: Current Every Day Smoker    Packs/day: 1.00    Types: Cigarettes  . Smokeless tobacco: Never Used  Substance and Sexual Activity  . Alcohol use: Yes  . Drug use: Never  . Sexual activity: Not on file  Other Topics Concern  . Not on file  Social History Narrative   ** Merged History Encounter **       Social Determinants of Health   Financial Resource Strain:   . Difficulty of Paying  Living Expenses: Not on file  Food Insecurity:   . Worried About Running Out of Food in the Last Year: Not on file  . Ran Out of Food in the Last Year: Not on file  Transportation Needs:   . Lack of Transportation (Medical): Not on file  . Lack of Transportation (Non-Medical): Not on file  Physical Activity:   . Days of Exercise per Week: Not on file  . Minutes of Exercise per Session: Not on file  Stress:   . Feeling of Stress : Not on file  Social Connections:   . Frequency of Communication with Friends and Family: Not on file  . Frequency of Social Gatherings with Friends and Family: Not on file  . Attends Religious Services: Not on file  . Active Member of Clubs or Organizations: Not on file  . Attends Club or Organization Meetings: Not on file  . Marital Status: Not on file  Intimate Partner Violence:   . Fear of Current or Ex-Partner: Not on file  . Emotionally Abused: Not on file  . Physically Abused: Not on file  . Sexually Abused: Not on file    Outpatient Medications Prior to Visit  Medication Sig Dispense Refill  . aspirin EC 81 MG tablet   Take 81 mg by mouth daily.    . aspirin-acetaminophen-caffeine (EXCEDRIN EXTRA STRENGTH) 250-250-65 MG per tablet Take 2 tablets by mouth every 6 (six) hours as needed for headache (and pain).    . buPROPion (WELLBUTRIN XL) 300 MG 24 hr tablet Take 300 mg by mouth daily.    . caffeine 200 MG TABS tablet Take 200 mg by mouth daily as needed (alertness).    . dicyclomine (BENTYL) 20 MG tablet Take 10 mg by mouth in the morning and at bedtime.   1  . EPIPEN 2-PAK 0.3 MG/0.3ML SOAJ injection   0  . escitalopram (LEXAPRO) 20 MG tablet Take 1 tablet (20 mg total) by mouth daily. 30 tablet 1  . famotidine (PEPCID) 40 MG tablet Take 1 tablet (40 mg total) by mouth daily. 30 tablet 1  . fluticasone (FLONASE) 50 MCG/ACT nasal spray Place 2 sprays into both nostrils daily. 16 mL 11  . omeprazole (PRILOSEC) 40 MG capsule Take 40 mg by mouth  daily.    . phentermine (ADIPEX-P) 37.5 MG tablet Take 37.5 mg by mouth daily before breakfast.    . traZODone (DESYREL) 100 MG tablet Take 1 tablet (100 mg total) by mouth at bedtime. 30 tablet 2  . dexlansoprazole (DEXILANT) 60 MG capsule Take 1 capsule (60 mg total) by mouth daily. 30 capsule 1   No facility-administered medications prior to visit.    Allergies  Allergen Reactions  . Morphine And Related Anaphylaxis  . Codeine Other (See Comments)    Fainting.  . Codeine     ROS Review of Systems  Constitutional: Positive for fatigue and fever.  Respiratory: Negative.   Cardiovascular: Negative.   Gastrointestinal: Positive for abdominal pain, blood in stool, constipation, diarrhea, nausea and vomiting.  Genitourinary: Negative.  Negative for vaginal discharge and vaginal pain.  Musculoskeletal: Negative for arthralgias.  Neurological: Negative for dizziness and weakness.      Objective:     Today's Vitals   11/01/20 1012  BP: 116/72  Pulse: 84  Temp: 97.9 F (36.6 C)  TempSrc: Temporal  SpO2: 98%  Weight: 190 lb 3.2 oz (86.3 kg)  Height: 5' 6" (1.676 m)   Body mass index is 30.7 kg/m. Physical Exam Constitutional:      Appearance: She is well-developed.  HENT:     Head: Normocephalic and atraumatic.     Mouth/Throat:     Mouth: Mucous membranes are moist.  Pulmonary:     Effort: Pulmonary effort is normal.     Breath sounds: Normal breath sounds.  Abdominal:     General: Bowel sounds are normal. There is no distension.     Palpations: Abdomen is soft.     Tenderness: There is abdominal tenderness in the right lower quadrant and left lower quadrant. There is no rebound.     Comments: Left lower quad abdominal pain  Genitourinary:    Rectum: Normal. Guaiac result negative. No mass or tenderness.  Neurological:     Mental Status: She is alert.     BP 116/72 (BP Location: Left Arm, Patient Position: Sitting, Cuff Size: Normal)   Pulse 84   Temp 97.9  F (36.6 C) (Temporal)   Ht 5' 6" (1.676 m)   Wt 190 lb 3.2 oz (86.3 kg)   LMP 01/04/2016 (Approximate)   SpO2 98%   BMI 30.70 kg/m  Wt Readings from Last 3 Encounters:  11/01/20 190 lb 3.2 oz (86.3 kg)  10/27/20 193 lb (87.5 kg)  10/07/20 193   lb 6.4 oz (87.7 kg)     Health Maintenance Due  Topic Date Due  . Hepatitis C Screening  Never done  . COVID-19 Vaccine (1) Never done  . TETANUS/TDAP  Never done  . PAP SMEAR-Modifier  Never done     Lab Results  Component Value Date   TSH 1.400 09/12/2020   Lab Results  Component Value Date   WBC 14.7 (H) 09/12/2020   HGB 14.8 09/12/2020   HCT 42.9 09/12/2020   MCV 96 09/12/2020   PLT 270 09/12/2020   Lab Results  Component Value Date   NA 137 09/12/2020   K 4.1 09/12/2020   CO2 23 09/12/2020   GLUCOSE 97 09/12/2020   BUN 8 09/12/2020   CREATININE 0.79 09/12/2020   BILITOT <0.2 09/12/2020   ALKPHOS 86 09/12/2020   AST 12 09/12/2020   ALT 16 09/12/2020   PROT 6.9 09/12/2020   ALBUMIN 4.3 09/12/2020   CALCIUM 10.1 09/12/2020   ANIONGAP 9 08/27/2018   Lab Results  Component Value Date   CHOL 211 (H) 09/12/2020   Lab Results  Component Value Date   HDL 39 (L) 09/12/2020   Lab Results  Component Value Date   LDLCALC 124 (H) 09/12/2020   Lab Results  Component Value Date   TRIG 269 (H) 09/12/2020   Lab Results  Component Value Date   CHOLHDL 5.4 (H) 09/12/2020     Assessment & Plan:  1. Leukocytosis, unspecified type - CBC with Differential/Platelet - TSH - CT Abdomen Pelvis W Contrast; Future Concern for LLQ pain, elevated WBC, blood in stool-concern for diverticulitis-fever noted at home.  2. Blood in stool - CBC with Differential/Platelet - Stool Culture - Ova and parasite examination - Giardia, EIA; Ova/Parasite - CT Abdomen Pelvis W Contrast; Future - Ambulatory referral to Gastroenterology Heme negative in clinic-need additional evaluation 3. Fatigue, unspecified type - CBC with  Differential/Platelet  4. Bad oral taste Trial of protonix + pepcid-previously using omeprazole BID with no improvement - CBC with Differential/Platelet - CMP14+EGFR - Ambulatory referral to Gastroenterology  5. Gastroesophageal reflux disease, unspecified whether esophagitis present H/o of reflux-pt needs EGD-protonix-previously taking omeprazole - CBC with Differential/Platelet - CMP14+EGFR - Amylase - Lipase - Ambulatory referral to Gastroenterology  6. High risk sexual behavior, unspecified type Pt request STD testing-no vaginal discharge or h/o herpes - RPR - Hepatitis, Acute - HIV antibody (with reflex)  7. Abdominal pain, left lower quadrant CT scan reviewed by phone with pt-recommended GI referral with ongoing pain , blood noted and sour taste in mouth, fever and elevated WBC - POCT URINALYSIS DIP (CLINITEK) - Ambulatory referral to Gastroenterology  Follow-up: GI   Majesti Gambrell Hannah Beat, MD

## 2020-11-02 ENCOUNTER — Encounter: Payer: Self-pay | Admitting: Family Medicine

## 2020-11-02 ENCOUNTER — Other Ambulatory Visit: Payer: Self-pay | Admitting: Family Medicine

## 2020-11-02 DIAGNOSIS — R946 Abnormal results of thyroid function studies: Secondary | ICD-10-CM

## 2020-11-02 LAB — CMP14+EGFR
ALT: 10 IU/L (ref 0–32)
AST: 13 IU/L (ref 0–40)
Albumin/Globulin Ratio: 2.1 (ref 1.2–2.2)
Albumin: 4.4 g/dL (ref 3.8–4.8)
Alkaline Phosphatase: 79 IU/L (ref 44–121)
BUN/Creatinine Ratio: 7 — ABNORMAL LOW (ref 9–23)
BUN: 5 mg/dL — ABNORMAL LOW (ref 6–24)
Bilirubin Total: <0.2 mg/dL (ref 0.0–1.2)
CO2: 19 mmol/L — ABNORMAL LOW (ref 20–29)
Calcium: 9.4 mg/dL (ref 8.7–10.2)
Chloride: 103 mmol/L (ref 96–106)
Creatinine, Ser: 0.72 mg/dL (ref 0.57–1.00)
GFR calc Af Amer: 116 mL/min/{1.73_m2} (ref >59)
GFR calc non Af Amer: 101 mL/min/{1.73_m2} (ref >59)
Globulin, Total: 2.1 g/dL (ref 1.5–4.5)
Glucose: 88 mg/dL (ref 65–99)
Potassium: 4.1 mmol/L (ref 3.5–5.2)
Sodium: 141 mmol/L (ref 134–144)
Total Protein: 6.5 g/dL (ref 6.0–8.5)

## 2020-11-02 LAB — CBC WITH DIFFERENTIAL/PLATELET
Basophils Absolute: 0.1 10*3/uL (ref 0.0–0.2)
Basos: 0 %
EOS (ABSOLUTE): 0.1 10*3/uL (ref 0.0–0.4)
Eos: 1 %
Hematocrit: 41.7 % (ref 34.0–46.6)
Hemoglobin: 14.6 g/dL (ref 11.1–15.9)
Immature Grans (Abs): 0 10*3/uL (ref 0.0–0.1)
Immature Granulocytes: 0 %
Lymphocytes Absolute: 5.4 10*3/uL — ABNORMAL HIGH (ref 0.7–3.1)
Lymphs: 37 %
MCH: 34.3 pg — ABNORMAL HIGH (ref 26.6–33.0)
MCHC: 35 g/dL (ref 31.5–35.7)
MCV: 98 fL — ABNORMAL HIGH (ref 79–97)
Monocytes Absolute: 0.7 10*3/uL (ref 0.1–0.9)
Monocytes: 4 %
Neutrophils Absolute: 8.5 10*3/uL — ABNORMAL HIGH (ref 1.4–7.0)
Neutrophils: 58 %
Platelets: 231 10*3/uL (ref 150–450)
RBC: 4.26 x10E6/uL (ref 3.77–5.28)
RDW: 12 % (ref 11.7–15.4)
WBC: 14.7 10*3/uL — ABNORMAL HIGH (ref 3.4–10.8)

## 2020-11-02 LAB — HEPATITIS PANEL, ACUTE
Hep A IgM: NEGATIVE
Hep B C IgM: NEGATIVE
Hep C Virus Ab: <0.1 s/co ratio (ref 0.0–0.9)
Hepatitis B Surface Ag: NEGATIVE

## 2020-11-02 LAB — TSH: TSH: 0.306 u[IU]/mL — ABNORMAL LOW (ref 0.450–4.500)

## 2020-11-02 LAB — RPR: RPR Ser Ql: NONREACTIVE

## 2020-11-02 LAB — AMYLASE: Amylase: 52 U/L (ref 31–110)

## 2020-11-02 LAB — LIPASE: Lipase: 29 U/L (ref 14–72)

## 2020-11-02 LAB — HIV ANTIBODY (ROUTINE TESTING W REFLEX): HIV Screen 4th Generation wRfx: NONREACTIVE

## 2020-11-06 DIAGNOSIS — Z7251 High risk heterosexual behavior: Secondary | ICD-10-CM | POA: Insufficient documentation

## 2020-11-06 DIAGNOSIS — R1032 Left lower quadrant pain: Secondary | ICD-10-CM | POA: Insufficient documentation

## 2020-11-06 DIAGNOSIS — R438 Other disturbances of smell and taste: Secondary | ICD-10-CM | POA: Insufficient documentation

## 2020-11-06 HISTORY — DX: High risk heterosexual behavior: Z72.51

## 2020-11-07 DIAGNOSIS — K921 Melena: Secondary | ICD-10-CM | POA: Diagnosis not present

## 2020-11-08 ENCOUNTER — Other Ambulatory Visit: Payer: Self-pay

## 2020-11-08 ENCOUNTER — Encounter: Payer: Self-pay | Admitting: Family Medicine

## 2020-11-08 DIAGNOSIS — K921 Melena: Secondary | ICD-10-CM

## 2020-11-08 DIAGNOSIS — D72829 Elevated white blood cell count, unspecified: Secondary | ICD-10-CM

## 2020-11-14 ENCOUNTER — Other Ambulatory Visit: Payer: Self-pay

## 2020-11-14 LAB — GIARDIA, EIA; OVA/PARASITE: Giardia Ag, Stl: NEGATIVE

## 2020-11-14 LAB — STOOL CULTURE: E coli, Shiga toxin Assay: NEGATIVE

## 2020-11-14 MED ORDER — ESCITALOPRAM OXALATE 20 MG PO TABS
20.0000 mg | ORAL_TABLET | Freq: Every day | ORAL | 1 refills | Status: DC
Start: 2020-11-14 — End: 2021-01-26

## 2020-11-27 ENCOUNTER — Other Ambulatory Visit: Payer: Self-pay | Admitting: Oncology

## 2020-11-27 DIAGNOSIS — D72829 Elevated white blood cell count, unspecified: Secondary | ICD-10-CM

## 2020-11-27 NOTE — Progress Notes (Signed)
[facility]  [address] [place],  [place]  [address] [phone]  Clinic Day:  11/28/2020  Referring physician: Blane Ohara, MD   HISTORY OF PRESENT ILLNESS:  The patient is a 44 y.o. female who I was asked to consult upon for leukocytosis.  Recent labs revealed an elevated white count of 14.7.  Hospital labs dating back to 2012 have shown her with a consistently elevated white count.  According to the patient, she has known about her chronically elevated white count.  She was seen by hematologist in [place] 2 years ago for the exact same problem.  According to the patient, the hematologist there did not seem particularly concerned with her leukocytosis.  The patient has never had a splenectomy.  She denies being on steroids.  She denies having any recent surgeries.  She had COVID 3+ months ago, from which she has completely recuperated.  She denies having any B symptoms which concern her for her leukocytosis being related to an underlying hematologic malignancy. Of note, she has smoked a pack of cigarettes daily for 30 years.  PAST MEDICAL HISTORY:   Hypercholesterolemia, gastroesophageal reflux disease, irritable bowel syndrome, depression, rhinitis, degenerative joint disease  PAST SURGICAL HISTORY:   Past Surgical History:  Procedure Laterality Date  . ABDOMINAL HYSTERECTOMY    . APPENDECTOMY    . CHOLECYSTECTOMY    . JOINT REPLACEMENT    . KNEE ARTHROSCOPY    . right foot    . TUBAL LIGATION     lipoma surgery  dilatation and curettage  CURRENT MEDICATIONS:   Current Outpatient Medications  Medication Sig Dispense Refill  . aspirin EC 81 MG tablet Take 81 mg by mouth daily.    Marland Kitchen aspirin-acetaminophen-caffeine (EXCEDRIN EXTRA STRENGTH) 250-250-65 MG per tablet Take 2 tablets by mouth every 6 (six) hours as needed for headache (and pain).    Marland Kitchen buPROPion (WELLBUTRIN XL) 300 MG 24 hr tablet Take 300 mg by mouth daily.    . cetirizine  (ZYRTEC) 10 MG chewable tablet Chew 10 mg by mouth daily.    Marland Kitchen dicyclomine (BENTYL) 20 MG tablet Take 10 mg by mouth in the morning and at bedtime.   1  . EPIPEN 2-PAK 0.3 MG/0.3ML SOAJ injection   0  . escitalopram (LEXAPRO) 20 MG tablet Take 1 tablet (20 mg total) by mouth daily. 30 tablet 1  . famotidine (PEPCID) 40 MG tablet Take 1 tablet (40 mg total) by mouth daily. 30 tablet 1  . Multiple Vitamin (MULTIVITAMIN) tablet Take 1 tablet by mouth daily.    . pantoprazole (PROTONIX) 40 MG tablet Take 1 tablet (40 mg total) by mouth daily. 30 tablet 3  . traZODone (DESYREL) 100 MG tablet Take 1 tablet (100 mg total) by mouth at bedtime. 30 tablet 2   No current facility-administered medications for this visit.    ALLERGIES:   Allergies  Allergen Reactions  . Morphine And Related Anaphylaxis  . Codeine Other (See Comments)    Fainting.  . Codeine     FAMILY HISTORY:   Family History  Problem Relation Age of Onset  . Thyroid disease Sister   Her mother had a focus of liver cancer, which was resected.    SOCIAL HISTORY:  The patient was born and raised in [place].  She currently lives in the [place] community.  She is married, with 3 children and 1 grandchild. She did patient financial clearance work for 11 years.  She has  smoked at least 1 pack of cigarettes daily for the past 30 years.  She drinks alcohol socially.  REVIEW OF SYSTEMS:  Review of Systems  Constitutional: Positive for fatigue. Negative for fever.  HENT:   Negative for hearing loss and sore throat.   Eyes: Positive for eye problems (blurry vision).  Respiratory: Positive for cough. Negative for chest tightness and hemoptysis.   Cardiovascular: Negative for chest pain and palpitations.  Gastrointestinal: Positive for blood in stool (minor), constipation, diarrhea and nausea. Negative for abdominal distention, abdominal pain and vomiting.  Endocrine: Negative for hot flashes.  Genitourinary: Negative for  difficulty urinating, dysuria, frequency, hematuria and nocturia.   Musculoskeletal: Positive for arthralgias. Negative for back pain, gait problem and myalgias.  Skin: Negative.  Negative for itching and rash.  Neurological: Positive for headaches. Negative for dizziness, extremity weakness, gait problem, light-headedness and numbness.  Hematological: Negative.   Psychiatric/Behavioral: Positive for depression. Negative for suicidal ideas. The patient is nervous/anxious.      PHYSICAL EXAM:   Vital signs include a weight of 192 lb, temperature 98.7, pulse 74, respirations 16, blood pressure 132/67  Wt Readings from Last 3 Encounters:  11/28/20 191 lb 8 oz (86.9 kg)  11/01/20 190 lb 3.2 oz (86.3 kg)  10/27/20 193 lb (87.5 kg)   There is no height or weight on file to calculate BMI. Performance status (ECOG): 0 Physical Exam Constitutional:      Appearance: Normal appearance. She is not ill-appearing.  HENT:     Mouth/Throat:     Mouth: Mucous membranes are moist.     Pharynx: Oropharynx is clear. No oropharyngeal exudate or posterior oropharyngeal erythema.  Cardiovascular:     Rate and Rhythm: Normal rate and regular rhythm.     Heart sounds: No murmur heard.  No friction rub. No gallop.   Pulmonary:     Effort: Pulmonary effort is normal. No respiratory distress.     Breath sounds: Normal breath sounds. No wheezing, rhonchi or rales.  Abdominal:     General: Bowel sounds are normal. There is no distension.     Palpations: Abdomen is soft. There is no mass.     Tenderness: There is no abdominal tenderness.  Musculoskeletal:        General: No swelling.     Right lower leg: No edema.     Left lower leg: No edema.  Lymphadenopathy:     Cervical: No cervical adenopathy.     Upper Body:     Right upper body: No supraclavicular or axillary adenopathy.     Left upper body: No supraclavicular or axillary adenopathy.     Lower Body: No right inguinal adenopathy. No left inguinal  adenopathy.  Skin:    General: Skin is warm.     Coloration: Skin is not jaundiced.     Findings: No lesion or rash.  Neurological:     General: No focal deficit present.     Mental Status: She is alert and oriented to person, place, and time. Mental status is at baseline.     Cranial Nerves: Cranial nerves are intact.  Psychiatric:        Mood and Affect: Mood normal.        Behavior: Behavior normal.        Thought Content: Thought content normal.    LABS:    CBC Latest Ref Rng & Units 11/28/2020 11/01/2020 09/12/2020  WBC - 10.9 14.7(H) 14.7(H)  Hemoglobin 12.0 - 16.0 13.6 14.6 14.8  Hematocrit 36 - 46 39 41.7 42.9  Platelets 150 - 399 198 231 270   CMP Latest Ref Rng & Units 11/01/2020 09/12/2020 08/27/2018  Glucose 65 - 99 mg/dL 88 97 315(V)  BUN 6 - 24 mg/dL 5(L) 8 <7(O)  Creatinine 0.57 - 1.00 mg/dL 1.60 7.37 1.06  Sodium 134 - 144 mmol/L 141 137 139  Potassium 3.5 - 5.2 mmol/L 4.1 4.1 3.2(L)  Chloride 96 - 106 mmol/L 103 99 105  CO2 20 - 29 mmol/L 19(L) 23 25  Calcium 8.7 - 10.2 mg/dL 9.4 26.9 9.2  Total Protein 6.0 - 8.5 g/dL 6.5 6.9 -  Total Bilirubin 0.0 - 1.2 mg/dL <4.8 <5.4 -  Alkaline Phos 44 - 121 IU/L 79 86 -  AST 0 - 40 IU/L 13 12 -  ALT 0 - 32 IU/L 10 16 -    ASSESSMENT & PLAN:  A 43 y.o. female who I was asked to consult upon for leukocytosis.  Based upon her labs today, her white count is only minimally elevated.   When evaluating her history, I believe the major reason behind her leukocytosis is her smoking.  It is not uncommon for one's white count to completely normalize after smoking abstinence.  There is nothing per her history or physical exam which suggests her mild leukocytosis is due to something more ominous.  As that is the case, I do feel comfortable turning her care back over to her primary care office.   My recommendation would be for her CBC to be checked, at most, 1-2 times per year.  I would not have a problem seeing her in the future if new  hematologic issues arise that require repeat clinical assessment.  The patient understands all the plans discussed today and is in agreement with them.  I do appreciate Cox, Kirsten, MD for his new consult.   Greenley Martone Kirby Funk, MD

## 2020-11-28 ENCOUNTER — Inpatient Hospital Stay: Payer: BC Managed Care – PPO | Attending: Oncology

## 2020-11-28 ENCOUNTER — Telehealth: Payer: Self-pay | Admitting: Oncology

## 2020-11-28 ENCOUNTER — Other Ambulatory Visit: Payer: Self-pay | Admitting: Hematology and Oncology

## 2020-11-28 ENCOUNTER — Other Ambulatory Visit: Payer: Self-pay

## 2020-11-28 ENCOUNTER — Inpatient Hospital Stay (INDEPENDENT_AMBULATORY_CARE_PROVIDER_SITE_OTHER): Payer: BC Managed Care – PPO | Admitting: Oncology

## 2020-11-28 DIAGNOSIS — D72829 Elevated white blood cell count, unspecified: Secondary | ICD-10-CM | POA: Diagnosis not present

## 2020-11-28 DIAGNOSIS — Z0001 Encounter for general adult medical examination with abnormal findings: Secondary | ICD-10-CM | POA: Diagnosis not present

## 2020-11-28 LAB — CBC
MCV: 99 (ref 76–111)
RBC: 3.99 (ref 3.87–5.11)

## 2020-11-28 LAB — CBC AND DIFFERENTIAL
HCT: 39 (ref 36–46)
Hemoglobin: 13.6 (ref 12.0–16.0)
Neutrophils Absolute: 6.43
Platelets: 198 (ref 150–399)
WBC: 10.9

## 2020-11-28 NOTE — Telephone Encounter (Signed)
Printed Medical Records

## 2020-11-28 NOTE — Telephone Encounter (Signed)
Per 12/6 LOS, patient to return if symptoms worsen or does not improve

## 2020-12-01 DIAGNOSIS — K921 Melena: Secondary | ICD-10-CM | POA: Diagnosis not present

## 2020-12-06 ENCOUNTER — Other Ambulatory Visit: Payer: Self-pay

## 2020-12-06 ENCOUNTER — Other Ambulatory Visit: Payer: BC Managed Care – PPO

## 2020-12-06 DIAGNOSIS — R946 Abnormal results of thyroid function studies: Secondary | ICD-10-CM | POA: Diagnosis not present

## 2020-12-07 DIAGNOSIS — Z1159 Encounter for screening for other viral diseases: Secondary | ICD-10-CM | POA: Diagnosis not present

## 2020-12-07 DIAGNOSIS — Z20828 Contact with and (suspected) exposure to other viral communicable diseases: Secondary | ICD-10-CM | POA: Diagnosis not present

## 2020-12-07 LAB — T4 AND TSH
T4, Total: 6 ug/dL (ref 4.5–12.0)
TSH: 1.42 u[IU]/mL (ref 0.450–4.500)

## 2020-12-09 ENCOUNTER — Other Ambulatory Visit: Payer: Self-pay | Admitting: Family Medicine

## 2020-12-12 ENCOUNTER — Other Ambulatory Visit: Payer: Self-pay

## 2020-12-12 MED ORDER — PANTOPRAZOLE SODIUM 40 MG PO TBEC
40.0000 mg | DELAYED_RELEASE_TABLET | Freq: Every day | ORAL | 3 refills | Status: DC
Start: 2020-12-12 — End: 2021-05-01

## 2020-12-13 DIAGNOSIS — D128 Benign neoplasm of rectum: Secondary | ICD-10-CM | POA: Diagnosis not present

## 2020-12-13 DIAGNOSIS — K921 Melena: Secondary | ICD-10-CM | POA: Diagnosis not present

## 2020-12-13 DIAGNOSIS — K621 Rectal polyp: Secondary | ICD-10-CM | POA: Diagnosis not present

## 2020-12-13 DIAGNOSIS — K449 Diaphragmatic hernia without obstruction or gangrene: Secondary | ICD-10-CM | POA: Diagnosis not present

## 2020-12-13 DIAGNOSIS — K222 Esophageal obstruction: Secondary | ICD-10-CM | POA: Diagnosis not present

## 2020-12-13 DIAGNOSIS — Z7982 Long term (current) use of aspirin: Secondary | ICD-10-CM | POA: Diagnosis not present

## 2020-12-13 DIAGNOSIS — D124 Benign neoplasm of descending colon: Secondary | ICD-10-CM | POA: Diagnosis not present

## 2020-12-13 DIAGNOSIS — K219 Gastro-esophageal reflux disease without esophagitis: Secondary | ICD-10-CM | POA: Diagnosis not present

## 2020-12-13 DIAGNOSIS — Z79899 Other long term (current) drug therapy: Secondary | ICD-10-CM | POA: Diagnosis not present

## 2020-12-13 DIAGNOSIS — M1990 Unspecified osteoarthritis, unspecified site: Secondary | ICD-10-CM | POA: Diagnosis not present

## 2020-12-13 DIAGNOSIS — Z800 Family history of malignant neoplasm of digestive organs: Secondary | ICD-10-CM | POA: Diagnosis not present

## 2020-12-13 LAB — HM COLONOSCOPY

## 2020-12-20 ENCOUNTER — Other Ambulatory Visit: Payer: Self-pay

## 2020-12-20 ENCOUNTER — Ambulatory Visit (INDEPENDENT_AMBULATORY_CARE_PROVIDER_SITE_OTHER): Payer: BC Managed Care – PPO

## 2020-12-20 DIAGNOSIS — Z23 Encounter for immunization: Secondary | ICD-10-CM | POA: Diagnosis not present

## 2020-12-20 NOTE — Progress Notes (Signed)
   Covid-19 Vaccination Clinic  Name:  Nichole Gonzalez    MRN: [id number] DOB: 12-08-1976  12/20/2020  [name] was observed post Covid-19 immunization for 15 minutes without incident. She was provided with Vaccine Information Sheet and instruction to access the V-Safe system.   [name] was instructed to call 911 with any severe reactions post vaccine: Marland Kitchen Difficulty breathing  . Swelling of face and throat  . A fast heartbeat  . A bad rash all over body  . Dizziness and weakness   Immunizations Administered    Name Date Dose VIS Date Route   Moderna COVID-19 Vaccine 12/20/2020 10:00 AM 0.5 mL 10/12/2020 Intramuscular   Manufacturer: Gala Murdoch   Lot: [id number]   NDC: 39030-092-33

## 2021-01-09 ENCOUNTER — Ambulatory Visit: Payer: BC Managed Care – PPO | Admitting: Family Medicine

## 2021-01-11 ENCOUNTER — Ambulatory Visit: Payer: BC Managed Care – PPO | Admitting: Nurse Practitioner

## 2021-01-18 ENCOUNTER — Other Ambulatory Visit: Payer: Self-pay | Admitting: Family Medicine

## 2021-01-18 ENCOUNTER — Encounter: Payer: Self-pay | Admitting: Family Medicine

## 2021-01-18 MED ORDER — CETIRIZINE HCL 10 MG PO TABS: 10.0000 mg | ORAL_TABLET | Freq: Every day | ORAL | 0 refills | Status: DC

## 2021-01-18 MED ORDER — CETIRIZINE HCL 10 MG PO CHEW: 10.0000 mg | CHEWABLE_TABLET | Freq: Every day | ORAL | 0 refills | Status: DC

## 2021-01-18 MED ORDER — BUPROPION HCL ER (XL) 300 MG PO TB24: 300.0000 mg | ORAL_TABLET | Freq: Every day | ORAL | 0 refills | Status: DC

## 2021-01-22 ENCOUNTER — Other Ambulatory Visit: Payer: Self-pay | Admitting: Family Medicine

## 2021-01-25 ENCOUNTER — Other Ambulatory Visit: Payer: Self-pay

## 2021-01-25 MED ORDER — TRAZODONE HCL 100 MG PO TABS: 100.0000 mg | ORAL_TABLET | Freq: Every day | ORAL | 2 refills | Status: DC

## 2021-01-26 ENCOUNTER — Other Ambulatory Visit: Payer: Self-pay

## 2021-01-26 MED ORDER — ESCITALOPRAM OXALATE 20 MG PO TABS: 20.0000 mg | ORAL_TABLET | Freq: Every day | ORAL | 1 refills | Status: DC

## 2021-02-17 ENCOUNTER — Other Ambulatory Visit: Payer: Self-pay | Admitting: Family Medicine

## 2021-02-17 MED ORDER — OMEPRAZOLE 40 MG PO CPDR: 40.0000 mg | DELAYED_RELEASE_CAPSULE | Freq: Every day | ORAL | 3 refills | Status: DC

## 2021-02-17 MED ORDER — CITALOPRAM HYDROBROMIDE 40 MG PO TABS: 20.0000 mg | ORAL_TABLET | Freq: Every day | ORAL | 1 refills | Status: DC

## 2021-03-28 ENCOUNTER — Other Ambulatory Visit: Payer: Self-pay | Admitting: Family Medicine

## 2021-04-12 ENCOUNTER — Other Ambulatory Visit: Payer: Self-pay | Admitting: Family Medicine

## 2021-04-14 ENCOUNTER — Encounter: Payer: Self-pay | Admitting: Family Medicine

## 2021-04-14 ENCOUNTER — Other Ambulatory Visit: Payer: Self-pay

## 2021-04-14 MED ORDER — CETIRIZINE HCL 10 MG PO TABS: 10.0000 mg | ORAL_TABLET | Freq: Every day | ORAL | 0 refills | Status: DC

## 2021-04-14 MED ORDER — BUPROPION HCL ER (XL) 300 MG PO TB24: 300.0000 mg | ORAL_TABLET | Freq: Every day | ORAL | 0 refills | Status: DC

## 2021-04-28 ENCOUNTER — Other Ambulatory Visit: Payer: Self-pay | Admitting: Family Medicine

## 2021-05-07 ENCOUNTER — Other Ambulatory Visit: Payer: Self-pay | Admitting: Family Medicine

## 2021-05-07 MED ORDER — CETIRIZINE HCL 10 MG PO TABS: 10.0000 mg | ORAL_TABLET | Freq: Every day | ORAL | 3 refills | Status: DC

## 2021-05-17 NOTE — Progress Notes (Signed)
Subjective:  Patient ID: Nichole Gonzalez, female    DOB: 06-Mar-1976  Age: 45 y.o. MRN: [id number]  Chief Complaint  Patient presents with  . Gastroesophageal Reflux  . Hyperlipidemia    HPI Gastroesophageal reflux disease, unspecified whether esophagitis present Omeprazole 40 mg daily, Pepcid 40 mg daily  Mixed hyperlipidemia Crestor 10 mg daily Works out at gym. Decreased appetite. Eats breakfast and lunch. No supper.   Depression, major, recurrent, moderate (HCC) Wellbutrin 300 mg once in am and Celexa 40 mg once daily scored 19 on PHQ-9 and 14 on GAD 7 Pt has had an increase in depression over the last 2 weeks. Has her mother and her 2 adult children living at home. Everyone gets along ok. Never ran out of medicines. Spoke with a counselor once in her 17s. Did not tolerate nicoderm patches or nicoderm gum.  Current Outpatient Medications on File Prior to Visit  Medication Sig Dispense Refill  . aspirin EC 81 MG tablet Take 81 mg by mouth daily.    Marland Kitchen aspirin-acetaminophen-caffeine (EXCEDRIN EXTRA STRENGTH) 250-250-65 MG per tablet Take 2 tablets by mouth every 6 (six) hours as needed for headache (and pain).    Marland Kitchen buPROPion (WELLBUTRIN XL) 300 MG 24 hr tablet Take 1 tablet by mouth once daily 30 tablet 0  . cetirizine (ZYRTEC ALLERGY) 10 MG tablet Take 1 tablet (10 mg total) by mouth daily. 90 tablet 3  . citalopram (CELEXA) 40 MG tablet Take 0.5 tablets (20 mg total) by mouth daily. 90 tablet 1  . dicyclomine (BENTYL) 20 MG tablet Take 10 mg by mouth in the morning and at bedtime.   1  . EPIPEN 2-PAK 0.3 MG/0.3ML SOAJ injection   0  . famotidine (PEPCID) 40 MG tablet Take 1 tablet (40 mg total) by mouth daily. 30 tablet 1  . Multiple Vitamin (MULTIVITAMIN) tablet Take 1 tablet by mouth daily.    . rosuvastatin (CRESTOR) 10 MG tablet Take 1 tablet by mouth once daily 90 tablet 0  . traZODone (DESYREL) 100 MG tablet TAKE 1 TABLET BY MOUTH AT BEDTIME 30 tablet 0   No current  facility-administered medications on file prior to visit.   History reviewed. No pertinent past medical history. Past Surgical History:  Procedure Laterality Date  . ABDOMINAL HYSTERECTOMY    . APPENDECTOMY    . CHOLECYSTECTOMY    . JOINT REPLACEMENT    . KNEE ARTHROSCOPY    . right foot    . TUBAL LIGATION      Family History  Problem Relation Age of Onset  . Thyroid disease Sister    Social History   Socioeconomic History  . Marital status: Married    Spouse name: Not on file  . Number of children: Not on file  . Years of education: Not on file  . Highest education level: Not on file  Occupational History  . Not on file  Tobacco Use  . Smoking status: Current Every Day Smoker    Packs/day: 1.00    Years: 30.00    Pack years: 30.00    Types: Cigarettes  . Smokeless tobacco: Never Used  Substance and Sexual Activity  . Alcohol use: Yes    Comment: socially  . Drug use: Never  . Sexual activity: Not on file  Other Topics Concern  . Not on file  Social History Narrative   ** Merged History Encounter **       Social Determinants of Health   Financial Resource Strain:  Not on file  Food Insecurity: Not on file  Transportation Needs: Not on file  Physical Activity: Not on file  Stress: Not on file  Social Connections: Not on file    Review of Systems  Constitutional: Negative for chills, fatigue and fever.  HENT: Positive for congestion. Negative for ear pain, rhinorrhea and sore throat.   Respiratory: Negative for cough and shortness of breath.   Cardiovascular: Negative for chest pain.  Gastrointestinal: Negative for abdominal pain, constipation, diarrhea, nausea and vomiting.  Genitourinary: Negative for dysuria and urgency.  Musculoskeletal: Positive for arthralgias, back pain and myalgias.  Neurological: Negative for dizziness, weakness, light-headedness and headaches.  Psychiatric/Behavioral: Positive for dysphoric mood. The patient is nervous/anxious.       Objective:  BP 118/82   Pulse 75   Temp (!) 97.5 F (36.4 C)   Ht 5\' 6"  (1.676 m)   Wt 197 lb (89.4 kg)   LMP 01/04/2016 (Approximate)   SpO2 99%   BMI 31.80 kg/m   BP/Weight 05/19/2021 11/28/2020 11/01/2020  Systolic BP 118 132 116  Diastolic BP 82 67 72  Wt. (Lbs) 197 191.5 190.2  BMI 31.8 31.87 30.7    Physical Exam Vitals reviewed.  Constitutional:      Appearance: Normal appearance. She is normal weight.  Neck:     Vascular: No carotid bruit.  Cardiovascular:     Rate and Rhythm: Normal rate and regular rhythm.     Pulses: Normal pulses.     Heart sounds: Normal heart sounds.  Pulmonary:     Effort: Pulmonary effort is normal. No respiratory distress.     Breath sounds: Normal breath sounds.  Abdominal:     General: Abdomen is flat. Bowel sounds are normal.     Palpations: Abdomen is soft.     Tenderness: There is no abdominal tenderness.  Musculoskeletal:        General: Tenderness present.     Comments: Tenderness on left shoulder anterior area.  Neurological:     Mental Status: She is alert and oriented to person, place, and time.  Psychiatric:        Behavior: Behavior normal.        Thought Content: Thought content normal.     Comments: Flat affect.     Diabetic Foot Exam - Simple   No data filed      Lab Results  Component Value Date   WBC 15.4 (H) 05/19/2021   HGB 14.7 05/19/2021   HCT 41.8 05/19/2021   PLT 256 05/19/2021   GLUCOSE 80 05/19/2021   CHOL 154 05/19/2021   TRIG 208 (H) 05/19/2021   HDL 34 (L) 05/19/2021   LDLCALC 85 05/19/2021   ALT 12 05/19/2021   AST 14 05/19/2021   NA 139 05/19/2021   K 4.2 05/19/2021   CL 102 05/19/2021   CREATININE 0.72 05/19/2021   BUN 7 05/19/2021   CO2 22 05/19/2021   TSH 1.370 05/19/2021      Assessment & Plan:   1. Mixed hyperlipidemia Well controlled.  No changes to medicines.  Continue to work on eating a healthy diet and exercise.  Labs drawn today.  - CBC with  Differential/Platelet - Comprehensive metabolic panel - Lipid panel -[facility]   2. Depression, major, recurrent, moderate (HCC) Increase wellbutrin xl 450 mg once daily in am. Continue citalopram.   3. Tobacco abuse Start on chantix 0.5 mg once daily for 3 days, then increase to 2 daily, then increase to 2 twice  daily.  4. Gastroesophageal reflux disease, unspecified whether esophagitis present Increase pantoprazole 40 mg one twice a day.  Stop omeprazole and pepcid. - pantoprazole (PROTONIX) 40 MG tablet; Take 1 tablet (40 mg total) by mouth 2 (two) times daily.  Dispense: 180 tablet; Refill: 1  5. Atrophy of thyroid (acquired) The current medical regimen is effective;  continue present plan and medications.  I,Marla I Leal-Borjas,acting as a scribe for Blane Ohara, MD.,have documented all relevant documentation on the behalf of Blane Ohara, MD,as directed by  Blane Ohara, MD while in the presence of Blane Ohara, MD.   Meds ordered this encounter  Medications  . pantoprazole (PROTONIX) 40 MG tablet    Sig: Take 1 tablet (40 mg total) by mouth 2 (two) times daily.    Dispense:  180 tablet    Refill:  1  . varenicline (CHANTIX) 0.5 MG tablet    Sig: Take 1 tablet (0.5 mg total) by mouth daily for 3 days, THEN 2 tablets (1 mg total) daily for 7 days, THEN 2 tablets (1 mg total) 2 (two) times daily for 20 days.    Dispense:  97 tablet    Refill:  0    Orders Placed This Encounter  Procedures  . CBC with Differential/Platelet  . Comprehensive metabolic panel  . Lipid panel  . TSH  . Cardiovascular Risk Assessment     Follow-up: Return in about 4 weeks (around 06/16/2021) for depression and tobacco use follow up. .  An After Visit Summary was printed and given to the patient.  Blane Ohara, MD [facility] [phone]   I,Katherina A Bramblett,acting as a scribe for Blane Ohara, MD.,have documented all relevant documentation on the behalf of Blane Ohara, MD,as  directed by  Blane Ohara, MD while in the presence of Blane Ohara, MD.

## 2021-05-18 ENCOUNTER — Other Ambulatory Visit: Payer: Self-pay | Admitting: Physician Assistant

## 2021-05-19 ENCOUNTER — Ambulatory Visit (INDEPENDENT_AMBULATORY_CARE_PROVIDER_SITE_OTHER): Payer: BC Managed Care – PPO | Admitting: Family Medicine

## 2021-05-19 ENCOUNTER — Encounter: Payer: Self-pay | Admitting: Family Medicine

## 2021-05-19 ENCOUNTER — Other Ambulatory Visit: Payer: Self-pay

## 2021-05-19 VITALS — BP 118/82 | HR 75 | Temp 97.5°F | Ht 66.0 in | Wt 197.0 lb

## 2021-05-19 DIAGNOSIS — K219 Gastro-esophageal reflux disease without esophagitis: Secondary | ICD-10-CM | POA: Diagnosis not present

## 2021-05-19 DIAGNOSIS — F331 Major depressive disorder, recurrent, moderate: Secondary | ICD-10-CM

## 2021-05-19 DIAGNOSIS — E782 Mixed hyperlipidemia: Secondary | ICD-10-CM | POA: Diagnosis not present

## 2021-05-19 DIAGNOSIS — Z720 Tobacco use: Secondary | ICD-10-CM | POA: Diagnosis not present

## 2021-05-19 DIAGNOSIS — E034 Atrophy of thyroid (acquired): Secondary | ICD-10-CM

## 2021-05-19 MED ORDER — PANTOPRAZOLE SODIUM 40 MG PO TBEC: 40.0000 mg | DELAYED_RELEASE_TABLET | Freq: Two times a day (BID) | ORAL | 1 refills | Status: DC

## 2021-05-19 MED ORDER — VARENICLINE TARTRATE 0.5 MG PO TABS: ORAL_TABLET | ORAL | 0 refills | Status: AC

## 2021-05-19 NOTE — Patient Instructions (Addendum)
GERD: Increase pantoprazole 40 mg one twice a day.  Stop omeprazole and pepcid.   Depression: Increase wellbutrin xl 450 mg once daily in am. Continue citalopram.   Tobacco use.  Start on chantix 0.5 mg once daily for 3 days, then increase to 2 daily, then increase to 2 twice daily.    Tobacco Use Disorder Tobacco use disorder (TUD) occurs when a person craves, seeks, and uses tobacco, regardless of the consequences. This disorder can cause problems with mental and physical health. It can affect your ability to have healthy relationships, and it can keep you from meeting your responsibilities at work, home, or school. Tobacco may be:  Smoked as a cigarette or cigar.  Inhaled using e-cigarettes.  Smoked in a pipe or hookah.  Chewed as smokeless tobacco.  Inhaled into the nostrils as snuff. Tobacco products contain a dangerous chemical called nicotine, which is very addictive. Nicotine triggers hormones that make the body feel stimulated and works on areas of the brain that make you feel good. These effects can make it hard for people to quit nicotine. Tobacco contains many other unsafe chemicals that can damage almost every organ in the body. Smoking tobacco also puts others in danger due to fire risk and possible health problems caused by breathing in secondhand smoke. What are the signs or symptoms? Symptoms of TUD may include:  Being unable to slow down or stop your tobacco use.  Spending an abnormal amount of time getting or using tobacco.  Craving tobacco. Cravings may last for up to 6 months after quitting.  Tobacco use that: ? Interferes with your work, school, or home life. ? Interferes with your personal and social relationships. ? Makes you give up activities that you once enjoyed or found important.  Using tobacco even though you know that it is: ? Dangerous or bad for your health or someone else's health. ? Causing problems in your life.  Needing more and more  of the substance to get the same effect (developing tolerance).  Experiencing unpleasant symptoms if you do not use the substance (withdrawal). Withdrawal symptoms may include: ? Depressed, anxious, or irritable mood. ? Difficulty concentrating. ? Increased appetite. ? Restlessness or trouble sleeping.  Using the substance to avoid withdrawal. How is this diagnosed? This condition may be diagnosed based on:  Your current and past tobacco use. Your health care provider may ask questions about how your tobacco use affects your life.  A physical exam. You may be diagnosed with TUD if you have at least two symptoms within a 27-month period. How is this treated? This condition is treated by stopping tobacco use. Many people are unable to quit on their own and need help. Treatment may include:  Nicotine replacement therapy (NRT). NRT provides nicotine without the other harmful chemicals in tobacco. NRT gradually lowers the dosage of nicotine in the body and reduces withdrawal symptoms. NRT is available as: ? Over-the-counter gums, lozenges, and skin patches. ? Prescription mouth inhalers and nasal sprays.  Medicine that acts on the brain to reduce cravings and withdrawal symptoms.  A type of talk therapy that examines your triggers for tobacco use, how to avoid them, and how to cope with cravings (behavioral therapy).  Hypnosis. This may help with withdrawal symptoms.  Joining a support group for others coping with TUD. The best treatment for TUD is usually a combination of medicine, talk therapy, and support groups. Recovery can be a long process. Many people start using tobacco again after stopping (  relapse). If you relapse, it does not mean that treatment will not work. Follow these instructions at home: Lifestyle  Do not use any products that contain nicotine or tobacco, such as cigarettes and e-cigarettes.  Avoid things that trigger tobacco use as much as you can. Triggers include  people and situations that usually cause you to use tobacco.  Avoid drinks that contain caffeine, including coffee. These may worsen some withdrawal symptoms.  Find ways to manage stress. Wanting to smoke may cause stress, and stress can make you want to smoke. Relaxation techniques such as deep breathing, meditation, and yoga may help.  Attend support groups as needed. These groups are an important part of long-term recovery for many people. General instructions  Take over-the-counter and prescription medicines only as told by your health care provider.  Check with your health care provider before taking any new prescription or over-the-counter medicines.  Decide on a friend, family member, or smoking quit-line (such as 1-800-QUIT-NOW in the [place] that you can call or text when you feel the urge to smoke or when you need help coping with cravings.  Keep all follow-up visits as told by your health care provider and therapist. This is important.   Contact a health care provider if:  You are not able to take your medicines as prescribed.  Your symptoms get worse, even with treatment. Summary  Tobacco use disorder (TUD) occurs when a person craves, seeks, and uses tobacco regardless of the consequences.  This condition may be diagnosed based on your current and past tobacco use and a physical exam.  Many people are unable to quit on their own and need help. Recovery can be a long process.  The most effective treatment for TUD is usually a combination of medicine, talk therapy, and support groups. This information is not intended to replace advice given to you by your health care provider. Make sure you discuss any questions you have with your health care provider. Document Revised: 11/27/2017 Document Reviewed: 11/27/2017 Elsevier Patient Education  2021 [employer].

## 2021-05-20 LAB — LIPID PANEL
Chol/HDL Ratio: 4.5 ratio — ABNORMAL HIGH (ref 0.0–4.4)
Cholesterol, Total: 154 mg/dL (ref 100–199)
HDL: 34 mg/dL — ABNORMAL LOW (ref >39)
LDL Chol Calc (NIH): 85 mg/dL (ref 0–99)
Triglycerides: 208 mg/dL — ABNORMAL HIGH (ref 0–149)
VLDL Cholesterol Cal: 35 mg/dL (ref 5–40)

## 2021-05-20 LAB — CBC WITH DIFFERENTIAL/PLATELET
Basophils Absolute: 0.1 10*3/uL (ref 0.0–0.2)
Basos: 0 %
EOS (ABSOLUTE): 0.2 10*3/uL (ref 0.0–0.4)
Eos: 1 %
Hematocrit: 41.8 % (ref 34.0–46.6)
Hemoglobin: 14.7 g/dL (ref 11.1–15.9)
Immature Grans (Abs): 0 10*3/uL (ref 0.0–0.1)
Immature Granulocytes: 0 %
Lymphocytes Absolute: 5.9 10*3/uL — ABNORMAL HIGH (ref 0.7–3.1)
Lymphs: 38 %
MCH: 34.1 pg — ABNORMAL HIGH (ref 26.6–33.0)
MCHC: 35.2 g/dL (ref 31.5–35.7)
MCV: 97 fL (ref 79–97)
Monocytes Absolute: 0.6 10*3/uL (ref 0.1–0.9)
Monocytes: 4 %
Neutrophils Absolute: 8.6 10*3/uL — ABNORMAL HIGH (ref 1.4–7.0)
Neutrophils: 57 %
Platelets: 256 10*3/uL (ref 150–450)
RBC: 4.31 x10E6/uL (ref 3.77–5.28)
RDW: 11.4 % — ABNORMAL LOW (ref 11.7–15.4)
WBC: 15.4 10*3/uL — ABNORMAL HIGH (ref 3.4–10.8)

## 2021-05-20 LAB — COMPREHENSIVE METABOLIC PANEL
ALT: 12 IU/L (ref 0–32)
AST: 14 IU/L (ref 0–40)
Albumin/Globulin Ratio: 1.9 (ref 1.2–2.2)
Albumin: 4.3 g/dL (ref 3.8–4.8)
Alkaline Phosphatase: 68 IU/L (ref 44–121)
BUN/Creatinine Ratio: 10 (ref 9–23)
BUN: 7 mg/dL (ref 6–24)
Bilirubin Total: 0.2 mg/dL (ref 0.0–1.2)
CO2: 22 mmol/L (ref 20–29)
Calcium: 9.4 mg/dL (ref 8.7–10.2)
Chloride: 102 mmol/L (ref 96–106)
Creatinine, Ser: 0.72 mg/dL (ref 0.57–1.00)
Globulin, Total: 2.3 g/dL (ref 1.5–4.5)
Glucose: 80 mg/dL (ref 65–99)
Potassium: 4.2 mmol/L (ref 3.5–5.2)
Sodium: 139 mmol/L (ref 134–144)
Total Protein: 6.6 g/dL (ref 6.0–8.5)
eGFR: 104 mL/min/{1.73_m2} (ref >59)

## 2021-05-20 LAB — CARDIOVASCULAR RISK ASSESSMENT

## 2021-05-20 LAB — TSH: TSH: 1.37 u[IU]/mL (ref 0.450–4.500)

## 2021-05-21 ENCOUNTER — Encounter: Payer: Self-pay | Admitting: Family Medicine

## 2021-05-21 DIAGNOSIS — E034 Atrophy of thyroid (acquired): Secondary | ICD-10-CM | POA: Insufficient documentation

## 2021-05-22 ENCOUNTER — Encounter: Payer: Self-pay | Admitting: Family Medicine

## 2021-05-23 ENCOUNTER — Other Ambulatory Visit: Payer: Self-pay

## 2021-05-23 DIAGNOSIS — D72829 Elevated white blood cell count, unspecified: Secondary | ICD-10-CM

## 2021-05-23 MED ORDER — OMEGA-3-ACID ETHYL ESTERS 1 G PO CAPS: 1.0000 g | ORAL_CAPSULE | Freq: Two times a day (BID) | ORAL | 1 refills | Status: DC

## 2021-05-25 ENCOUNTER — Encounter: Payer: Self-pay | Admitting: Family Medicine

## 2021-05-31 ENCOUNTER — Other Ambulatory Visit: Payer: Self-pay | Admitting: Family Medicine

## 2021-05-31 MED ORDER — BUPROPION HCL ER (XL) 150 MG PO TB24: 450.0000 mg | ORAL_TABLET | Freq: Every day | ORAL | 0 refills | Status: DC

## 2021-06-12 ENCOUNTER — Other Ambulatory Visit: Payer: Self-pay

## 2021-06-12 MED ORDER — DICYCLOMINE HCL 20 MG PO TABS: 10.0000 mg | ORAL_TABLET | Freq: Two times a day (BID) | ORAL | 1 refills | Status: DC

## 2021-06-12 MED ORDER — TRAZODONE HCL 100 MG PO TABS: 100.0000 mg | ORAL_TABLET | Freq: Every day | ORAL | 0 refills | Status: DC

## 2021-06-16 ENCOUNTER — Ambulatory Visit: Payer: BC Managed Care – PPO | Admitting: Nurse Practitioner

## 2021-06-22 ENCOUNTER — Other Ambulatory Visit: Payer: BC Managed Care – PPO

## 2021-06-29 DIAGNOSIS — S46012A Strain of muscle(s) and tendon(s) of the rotator cuff of left shoulder, initial encounter: Secondary | ICD-10-CM | POA: Diagnosis not present

## 2021-06-29 DIAGNOSIS — F17210 Nicotine dependence, cigarettes, uncomplicated: Secondary | ICD-10-CM | POA: Diagnosis not present

## 2021-06-29 DIAGNOSIS — M75122 Complete rotator cuff tear or rupture of left shoulder, not specified as traumatic: Secondary | ICD-10-CM | POA: Diagnosis not present

## 2021-06-29 DIAGNOSIS — M25512 Pain in left shoulder: Secondary | ICD-10-CM | POA: Diagnosis not present

## 2021-06-29 DIAGNOSIS — X500XXA Overexertion from strenuous movement or load, initial encounter: Secondary | ICD-10-CM | POA: Diagnosis not present

## 2021-06-29 DIAGNOSIS — W19XXXA Unspecified fall, initial encounter: Secondary | ICD-10-CM | POA: Diagnosis not present

## 2021-06-29 DIAGNOSIS — M1990 Unspecified osteoarthritis, unspecified site: Secondary | ICD-10-CM | POA: Diagnosis not present

## 2021-07-06 ENCOUNTER — Other Ambulatory Visit: Payer: Self-pay | Admitting: Physician Assistant

## 2021-08-04 ENCOUNTER — Other Ambulatory Visit: Payer: Self-pay | Admitting: Family Medicine

## 2021-09-11 ENCOUNTER — Other Ambulatory Visit: Payer: Self-pay

## 2021-09-11 ENCOUNTER — Encounter: Payer: Self-pay | Admitting: Family Medicine

## 2021-09-11 MED ORDER — DICYCLOMINE HCL 20 MG PO TABS: 10.0000 mg | ORAL_TABLET | Freq: Two times a day (BID) | ORAL | 1 refills | Status: DC

## 2021-09-11 MED ORDER — PHENTERMINE HCL 37.5 MG PO CAPS: 37.5000 mg | ORAL_CAPSULE | ORAL | 1 refills | Status: DC

## 2021-09-14 ENCOUNTER — Other Ambulatory Visit: Payer: Self-pay | Admitting: Family Medicine

## 2021-09-17 ENCOUNTER — Encounter: Payer: Self-pay | Admitting: Family Medicine

## 2021-10-09 ENCOUNTER — Other Ambulatory Visit: Payer: Self-pay

## 2021-10-09 MED ORDER — FLUTICASONE PROPIONATE 50 MCG/ACT NA SUSP: 1.0000 | Freq: Every day | NASAL | 2 refills | Status: DC

## 2021-11-20 ENCOUNTER — Other Ambulatory Visit: Payer: Self-pay | Admitting: Family Medicine

## 2021-12-18 ENCOUNTER — Other Ambulatory Visit: Payer: Self-pay | Admitting: Physician Assistant

## 2021-12-18 ENCOUNTER — Other Ambulatory Visit: Payer: Self-pay | Admitting: Family Medicine

## 2021-12-20 ENCOUNTER — Encounter: Payer: Self-pay | Admitting: Family Medicine

## 2021-12-29 ENCOUNTER — Encounter: Payer: Self-pay | Admitting: Family Medicine

## 2022-01-01 ENCOUNTER — Other Ambulatory Visit: Payer: Self-pay

## 2022-01-01 MED ORDER — TRAZODONE HCL 100 MG PO TABS: 100.0000 mg | ORAL_TABLET | Freq: Every day | ORAL | 0 refills | Status: DC

## 2022-02-01 ENCOUNTER — Other Ambulatory Visit: Payer: Self-pay | Admitting: Family Medicine

## 2022-02-20 ENCOUNTER — Other Ambulatory Visit: Payer: Self-pay | Admitting: Physician Assistant

## 2022-02-20 ENCOUNTER — Encounter: Payer: Self-pay | Admitting: Family Medicine

## 2022-02-20 ENCOUNTER — Other Ambulatory Visit: Payer: Self-pay | Admitting: Family Medicine

## 2022-02-20 MED ORDER — DICYCLOMINE HCL 20 MG PO TABS: 20.0000 mg | ORAL_TABLET | Freq: Two times a day (BID) | ORAL | 0 refills | Status: DC

## 2022-02-20 MED ORDER — ROSUVASTATIN CALCIUM 10 MG PO TABS: 10.0000 mg | ORAL_TABLET | Freq: Every day | ORAL | 0 refills | Status: DC

## 2022-02-20 NOTE — Telephone Encounter (Signed)
Refill sent to pharmacy.   

## 2022-02-21 ENCOUNTER — Other Ambulatory Visit: Payer: Self-pay | Admitting: Family Medicine

## 2022-02-21 NOTE — Telephone Encounter (Signed)
Refill sent to pharmacy.   

## 2022-09-27 ENCOUNTER — Encounter: Payer: Self-pay | Admitting: Family Medicine

## 2022-09-27 ENCOUNTER — Ambulatory Visit (INDEPENDENT_AMBULATORY_CARE_PROVIDER_SITE_OTHER): Payer: 59 | Admitting: Family Medicine

## 2022-09-27 ENCOUNTER — Other Ambulatory Visit: Payer: Self-pay

## 2022-09-27 VITALS — BP 120/80 | HR 78 | Temp 98.7°F | Resp 14 | Ht 66.0 in | Wt 198.0 lb

## 2022-09-27 DIAGNOSIS — F332 Major depressive disorder, recurrent severe without psychotic features: Secondary | ICD-10-CM

## 2022-09-27 DIAGNOSIS — F5101 Primary insomnia: Secondary | ICD-10-CM | POA: Diagnosis not present

## 2022-09-27 DIAGNOSIS — F17200 Nicotine dependence, unspecified, uncomplicated: Secondary | ICD-10-CM | POA: Diagnosis not present

## 2022-09-27 DIAGNOSIS — E034 Atrophy of thyroid (acquired): Secondary | ICD-10-CM | POA: Diagnosis not present

## 2022-09-27 DIAGNOSIS — Z6831 Body mass index (BMI) 31.0-31.9, adult: Secondary | ICD-10-CM

## 2022-09-27 DIAGNOSIS — K219 Gastro-esophageal reflux disease without esophagitis: Secondary | ICD-10-CM

## 2022-09-27 DIAGNOSIS — Z1231 Encounter for screening mammogram for malignant neoplasm of breast: Secondary | ICD-10-CM | POA: Diagnosis not present

## 2022-09-27 DIAGNOSIS — Z0000 Encounter for general adult medical examination without abnormal findings: Secondary | ICD-10-CM | POA: Diagnosis not present

## 2022-09-27 DIAGNOSIS — E6609 Other obesity due to excess calories: Secondary | ICD-10-CM | POA: Diagnosis not present

## 2022-09-27 MED ORDER — VARENICLINE TARTRATE (STARTER) 0.5 MG X 11 & 1 MG X 42 PO TBPK
ORAL_TABLET | ORAL | 0 refills | Status: DC
  Filled 2022-09-27: qty 53, 28d supply, fill #0

## 2022-09-27 MED ORDER — BUPROPION HCL ER (XL) 150 MG PO TB24
450.0000 mg | ORAL_TABLET | Freq: Every day | ORAL | 1 refills | Status: DC
  Filled 2022-09-27: qty 270, 90d supply, fill #0
  Filled 2022-12-20 – 2022-12-21 (×2): qty 270, 90d supply, fill #1

## 2022-09-27 MED ORDER — PHENTERMINE HCL 37.5 MG PO CAPS
37.5000 mg | ORAL_CAPSULE | Freq: Every morning | ORAL | 0 refills | Status: DC
  Filled 2022-09-27: qty 30, 30d supply, fill #0

## 2022-09-27 MED ORDER — OMEPRAZOLE 40 MG PO CPDR
40.0000 mg | DELAYED_RELEASE_CAPSULE | Freq: Every day | ORAL | 3 refills | Status: DC
  Filled 2022-09-27: qty 90, 90d supply, fill #0
  Filled 2022-12-20 – 2022-12-21 (×2): qty 90, 90d supply, fill #1
  Filled 2023-03-19: qty 90, 90d supply, fill #2
  Filled 2023-06-17: qty 90, 90d supply, fill #3

## 2022-09-27 MED ORDER — CITALOPRAM HYDROBROMIDE 20 MG PO TABS
20.0000 mg | ORAL_TABLET | Freq: Every day | ORAL | 1 refills | Status: DC
  Filled 2022-09-27: qty 90, 90d supply, fill #0
  Filled 2022-11-12 – 2022-12-21 (×4): qty 90, 90d supply, fill #1

## 2022-09-27 MED ORDER — DICYCLOMINE HCL 20 MG PO TABS
20.0000 mg | ORAL_TABLET | Freq: Two times a day (BID) | ORAL | 1 refills | Status: DC
  Filled 2022-09-27: qty 177, 89d supply, fill #0
  Filled 2022-09-27: qty 3, 1d supply, fill #0
  Filled 2022-12-20 – 2022-12-21 (×2): qty 180, 90d supply, fill #1

## 2022-09-27 MED ORDER — TRAZODONE HCL 100 MG PO TABS
100.0000 mg | ORAL_TABLET | Freq: Every day | ORAL | 1 refills | Status: DC
  Filled 2022-09-27: qty 90, 90d supply, fill #0
  Filled 2022-12-20 – 2022-12-21 (×2): qty 90, 90d supply, fill #1

## 2022-09-27 NOTE — Progress Notes (Signed)
Subjective:  Patient ID: Nichole Gonzalez, female    DOB: 06/28/1976  Age: 46 y.o. MRN: [id number]  Chief Complaint  Patient presents with   Annual Exam   Gastroesophageal Reflux   Hypothyroidism    HPI Well Adult Physical: Patient here for a comprehensive physical exam.The patient reports no problems Do you take any herbs or supplements that were not prescribed by a doctor? no Are you taking calcium supplements? no Are you taking aspirin daily? no  Encounter for general adult medical examination without abnormal findings  Physical ("At Risk" items are starred): Patient's last physical exam was 1 year ago .  Patient is not afflicted from Stress Incontinence and Urge Incontinence  Patient wears a seat belt, has smoke detectors, has carbon monoxide detectors, practices appropriate gun safety, and not wears sunscreen with extended sun exposure. Dental Care: biannual cleanings, brushes and flosses daily. Ophthalmology/Optometry: Annual visit. Last visit was 02/2022. Hearing loss: none Vision impairments: contacts everyday.  Menarche: 46 years old. Menstrual History: Irregular LMP: 18 years ago Pregnancy history: G6P3A3 Safe at home: yes Self breast exams: biweekly  Patient lost her insurance and so has been out of her medications below. She would like to restart them.  Patient understands I am doing her complete physical exam and a problem related visit. She will be responsible for a copay.   GERD: Patient is taking Pantoprazole 40 mg twice a day, Pepcid 40 mg daily.  Insomnia: Trazodone 100 mg daily.  Depression: She is currently taking Wellbutrin 450 mg daily, Celexa 20 mg daily.     09/27/2022    9:02 AM 05/19/2021   11:20 AM 10/09/2020   11:46 PM  PHQ9 SCORE ONLY  PHQ-9 Total Score 21 19 12      Social Hx   Social History   Socioeconomic History   Marital status: Married    Spouse name: Not on file   Number of children: 3   Years of education: Not on file    Highest education level: Not on file  Occupational History   Not on file  Tobacco Use   Smoking status: Every Day    Packs/day: 1.00    Years: 30.00    Total pack years: 30.00    Types: Cigarettes   Smokeless tobacco: Never  Substance and Sexual Activity   Alcohol use: Yes    Alcohol/week: 8.0 standard drinks of alcohol    Types: 8 Standard drinks or equivalent per week    Comment: socially   Drug use: Never   Sexual activity: Not on file  Other Topics Concern   Not on file  Social History Narrative   ** Merged History Encounter **       Social Determinants of Health   Financial Resource Strain: Low Risk  (09/27/2022)   Overall Financial Resource Strain (CARDIA)    Difficulty of Paying Living Expenses: Not hard at all  Food Insecurity: No Food Insecurity (09/27/2022)   Hunger Vital Sign    Worried About Running Out of Food in the Last Year: Never true    Ran Out of Food in the Last Year: Never true  Transportation Needs: No Transportation Needs (09/27/2022)   PRAPARE - 11/27/2022 (Medical): No    Lack of Transportation (Non-Medical): No  Physical Activity: Inactive (09/27/2022)   Exercise Vital Sign    Days of Exercise per Week: 0 days    Minutes of Exercise per Session: 0 min  Stress: No Stress  Concern Present (09/27/2022)   [place]    Feeling of Stress : Only a little  Social Connections: Moderately Isolated (09/27/2022)   Social Connection and Isolation Panel [NHANES]    Frequency of Communication with Friends and Family: More than three times a week    Frequency of Social Gatherings with Friends and Family: More than three times a week    Attends Religious Services: Never    Marine scientist or Organizations: No    Attends Music therapist: Never    Marital Status: Married   Past Medical History:  Diagnosis Date   Abnormal thyroid stimulating  hormone (TSH) level 02/17/2015   Abnormal uterine bleeding 10/07/2020   Allergic rhinitis 09/03/2015   Depression, recurrent ([place]) 09/08/2020   Gastroesophageal reflux disease 10/27/2020   High risk sexual behavior 11/06/2020   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   Primary insomnia 09/08/2020   Tobacco abuse 09/03/2015   Past Surgical History:  Procedure Laterality Date   ABDOMINAL HYSTERECTOMY     APPENDECTOMY     CHOLECYSTECTOMY     DILATION AND CURETTAGE, DIAGNOSTIC / THERAPEUTIC     JOINT REPLACEMENT     KNEE ARTHROSCOPY     right foot     TUBAL LIGATION      Family History  Problem Relation Age of Onset   Thyroid disease Sister    Coronary artery disease Father    Heart attack Father    COPD Sister    Diabetes Mellitus II Sister     Review of Systems  Constitutional:  Negative for chills, fatigue and fever.  HENT:  Negative for congestion, ear pain and sore throat.   Respiratory:  Negative for cough and shortness of breath.   Cardiovascular:  Negative for chest pain and palpitations.  Gastrointestinal:  Negative for abdominal pain, constipation, diarrhea, nausea and vomiting.  Endocrine: Negative for polydipsia, polyphagia and polyuria.  Genitourinary:  Negative for difficulty urinating and dysuria.  Musculoskeletal:  Negative for arthralgias, back pain and myalgias.  Skin:  Negative for rash.  Neurological:  Negative for headaches.  Psychiatric/Behavioral:  Negative for dysphoric mood. The patient is not nervous/anxious.      Objective:  BP 120/80   Pulse 78   Temp 98.7 F (37.1 C)   Resp 14   Ht 5\' 6"  (1.676 m)   Wt 198 lb (89.8 kg)   LMP 01/04/2016 (Approximate)   BMI 31.96 kg/m      09/27/2022    8:11 PM 05/19/2021   11:17 AM 11/28/2020    9:33 AM  BP/Weight  Systolic BP 182 993 716  Diastolic BP 80 82 67  Wt. (Lbs) 198 197 191.5  BMI 31.96 kg/m2 31.8 kg/m2 31.87 kg/m2    Physical Exam Vitals reviewed.  Constitutional:      General: She is not in  acute distress.    Appearance: Normal appearance. She is obese.  HENT:     Right Ear: Tympanic membrane and ear canal normal.     Left Ear: Tympanic membrane and ear canal normal.     Nose: Nose normal. No congestion or rhinorrhea.     Mouth/Throat:     Pharynx: Posterior oropharyngeal erythema present. No oropharyngeal exudate.  Eyes:     Conjunctiva/sclera: Conjunctivae normal.  Neck:     Thyroid: No thyroid mass.     Vascular: No carotid bruit.  Cardiovascular:     Rate and Rhythm: Normal rate and regular  rhythm.     Heart sounds: Normal heart sounds. No murmur heard. Pulmonary:     Effort: Pulmonary effort is normal.     Breath sounds: Normal breath sounds.  Abdominal:     General: Bowel sounds are normal.     Palpations: Abdomen is soft. There is no mass.     Tenderness: There is no abdominal tenderness.  Lymphadenopathy:     Cervical: No cervical adenopathy.  Skin:    General: Skin is warm and dry.  Neurological:     Mental Status: She is alert and oriented to person, place, and time.  Psychiatric:        Behavior: Behavior normal.     Comments: Depressed.     Lab Results  Component Value Date   WBC 12.6 (H) 09/27/2022   HGB 15.4 09/27/2022   HCT 44.9 09/27/2022   PLT 284 09/27/2022   GLUCOSE 74 09/27/2022   CHOL 228 (H) 09/27/2022   TRIG 210 (H) 09/27/2022   HDL 48 09/27/2022   LDLCALC 142 (H) 09/27/2022   ALT 22 09/27/2022   AST 22 09/27/2022   NA 140 09/27/2022   K 4.1 09/27/2022   CL 100 09/27/2022   CREATININE 0.73 09/27/2022   BUN 5 (L) 09/27/2022   CO2 25 09/27/2022   TSH 1.420 09/27/2022      Assessment & Plan:   Problem List Items Addressed This Visit       Digestive   Gastroesophageal reflux disease    Start omeprazole 40 mg daily.      Relevant Medications   dicyclomine (BENTYL) 20 MG tablet   omeprazole (PRILOSEC) 40 MG capsule     Endocrine   Atrophy of thyroid (acquired)    Previously well controlled Continue Synthroid at  current dose  Recheck TSH and adjust Synthroid as indicated          Other   Primary insomnia    Start back on trazodone 100 mg before bed.       Routine general medical examination at health care facility - Primary    Things to do to keep yourself healthy  - Exercise at least 30-45 minutes a day, 3-4 days a week.  - Eat a low-fat diet with lots of fruits and vegetables, up to 7-9 servings per day.  - Seatbelts can save your life. Wear them always.  - Smoke detectors on every level of your home, check batteries every year.  - Eye Doctor - have an eye exam every 1-2 years  - Safe sex - if you may be exposed to STDs, use a condom.  - Alcohol -  If you drink, do it moderately, less than 2 drinks per day.  - Health Care Power of Attorney. Choose someone to speak for you if you are not able. Please bring [place] a copy when completed.  - Depression is common in our stressful world.If you're feeling down or losing interest in things you normally enjoy, please come in for a visit.  - Violence - If anyone is threatening or hurting you, please call immediately.       Relevant Orders   Comprehensive metabolic panel (Completed)   Lipid panel (Completed)   CBC with Differential/Platelet (Completed)   TSH (Completed)   Visit for screening mammogram    Check mammogram.      Relevant Orders   MM 3D SCREEN BREAST BILATERAL   Tobacco use disorder    Cessation recommended.  Rx: chantix sent. Education given.  Relevant Medications   Varenicline Tartrate, Starter, (CHANTIX STARTING MONTH PAK) 0.5 MG X 11 & 1 MG X 42 TBPK   Other Relevant Orders   Ambulatory referral to Smoking Cessation Program   Severe recurrent major depression (HCC)     Restart citalopram 20 mg daily.  Restart Wellbutrin XL 150 mg once daily in a.m. for 1 to 2 weeks, then increase to 2 in AM x1 to 2 weeks, then may increase to 3 daily which was her previous prescription. I do recommend taking advantage of the  counseling offered through [facility].       Relevant Medications   buPROPion (WELLBUTRIN XL) 150 MG 24 hr tablet   citalopram (CELEXA) 20 MG tablet   traZODone (DESYREL) 100 MG tablet   Class 1 obesity due to excess calories with serious comorbidity and body mass index (BMI) of 31.0 to 31.9 in adult    Start on phentermine 37.5 mg once daily in am.  Recommend continue to work on eating healthy diet and exercise.       Relevant Medications   phentermine 37.5 MG capsule     This is a list of the screening recommended for you and due dates:  Health Maintenance  Topic Date Due   Colon Cancer Screening  12/13/2030   Tetanus Vaccine  08/20/2032   Flu Shot  Completed   Hepatitis C Screening: USPSTF Recommendation to screen - Ages 18-79 yo.  Completed   HIV Screening  Completed   HPV Vaccine  Aged Out   Pap Smear  Discontinued   COVID-19 Vaccine  Discontinued     Meds ordered this encounter  Medications   buPROPion (WELLBUTRIN XL) 150 MG 24 hr tablet    Sig: Take 3 tablets (450 mg total) by mouth daily.    Dispense:  270 tablet    Refill:  1   citalopram (CELEXA) 20 MG tablet    Sig: Take 1 tablet (20 mg total) by mouth daily.    Dispense:  90 tablet    Refill:  1   dicyclomine (BENTYL) 20 MG tablet    Sig: Take 1 tablet (20 mg total) by mouth in the morning and at bedtime.    Dispense:  180 tablet    Refill:  1   phentermine 37.5 MG capsule    Sig: Take 1 capsule (37.5 mg total) by mouth every morning.    Dispense:  30 capsule    Refill:  0   traZODone (DESYREL) 100 MG tablet    Sig: Take 1 tablet (100 mg total) by mouth at bedtime.    Dispense:  90 tablet    Refill:  1   omeprazole (PRILOSEC) 40 MG capsule    Sig: Take 1 capsule (40 mg total) by mouth daily.    Dispense:  90 capsule    Refill:  3   Varenicline Tartrate, Starter, (CHANTIX STARTING MONTH PAK) 0.5 MG X 11 & 1 MG X 42 TBPK    Sig: Take 0.5 mg by mouth daily for 3 days, THEN 0.5 mg 2 (two)  times daily for 4 days, THEN 1 mg 2 (two) times daily for 21 days.    Dispense:  53 each    Refill:  0    Follow-up: Return in about 3 months (around 12/28/2022) for chronic fasting.  An After Visit Summary was printed and given to the patient.  Blane Ohara, MD [facility] [phone]

## 2022-09-27 NOTE — Patient Instructions (Addendum)
Things to do to keep yourself healthy  - Exercise at least 30-45 minutes a day, 3-4 days a week.  - Eat a low-fat diet with lots of fruits and vegetables, up to 7-9 servings per day.  - Seatbelts can save your life. Wear them always.  - Smoke detectors on every level of your home, check batteries every year.  - Eye Doctor - have an eye exam every 1-2 years  - Safe sex - if you may be exposed to STDs, use a condom.  - Alcohol -  If you drink, do it moderately, less than 2 drinks per day.  - [place]. Choose someone to speak for you if you are not able. Please bring [place] a copy when completed.  - Depression is common in our stressful world.If you're feeling down or losing interest in things you normally enjoy, please come in for a visit.  - Violence - If anyone is threatening or hurting you, please call immediately.   Depression: Restart citalopram 20 mg daily.  Restart Wellbutrin XL 150 mg once daily in a.m. for 1 to 2 weeks, then increase to 2 in AM x1 to 2 weeks, then may increase to 3 daily which was her previous prescription. I do recommend taking advantage of the counseling offered through [facility].  Reflux: Start omeprazole 40 mg daily.

## 2022-09-28 ENCOUNTER — Encounter: Payer: Self-pay | Admitting: Family Medicine

## 2022-09-28 LAB — TSH: TSH: 1.42 u[IU]/mL (ref 0.450–4.500)

## 2022-09-28 LAB — CBC WITH DIFFERENTIAL/PLATELET
Basophils Absolute: 0.1 10*3/uL (ref 0.0–0.2)
Basos: 1 %
EOS (ABSOLUTE): 0.1 10*3/uL (ref 0.0–0.4)
Eos: 1 %
Hematocrit: 44.9 % (ref 34.0–46.6)
Hemoglobin: 15.4 g/dL (ref 11.1–15.9)
Immature Grans (Abs): 0 10*3/uL (ref 0.0–0.1)
Immature Granulocytes: 0 %
Lymphocytes Absolute: 4.4 10*3/uL — ABNORMAL HIGH (ref 0.7–3.1)
Lymphs: 35 %
MCH: 32.6 pg (ref 26.6–33.0)
MCHC: 34.3 g/dL (ref 31.5–35.7)
MCV: 95 fL (ref 79–97)
Monocytes Absolute: 0.6 10*3/uL (ref 0.1–0.9)
Monocytes: 5 %
Neutrophils Absolute: 7.4 10*3/uL — ABNORMAL HIGH (ref 1.4–7.0)
Neutrophils: 58 %
Platelets: 284 10*3/uL (ref 150–450)
RBC: 4.73 x10E6/uL (ref 3.77–5.28)
RDW: 11.9 % (ref 11.7–15.4)
WBC: 12.6 10*3/uL — ABNORMAL HIGH (ref 3.4–10.8)

## 2022-09-28 LAB — COMPREHENSIVE METABOLIC PANEL
ALT: 22 IU/L (ref 0–32)
AST: 22 IU/L (ref 0–40)
Albumin/Globulin Ratio: 1.7 (ref 1.2–2.2)
Albumin: 4.2 g/dL (ref 3.9–4.9)
Alkaline Phosphatase: 99 IU/L (ref 44–121)
BUN/Creatinine Ratio: 7 — ABNORMAL LOW (ref 9–23)
BUN: 5 mg/dL — ABNORMAL LOW (ref 6–24)
Bilirubin Total: 0.2 mg/dL (ref 0.0–1.2)
CO2: 25 mmol/L (ref 20–29)
Calcium: 9.4 mg/dL (ref 8.7–10.2)
Chloride: 100 mmol/L (ref 96–106)
Creatinine, Ser: 0.73 mg/dL (ref 0.57–1.00)
Globulin, Total: 2.5 g/dL (ref 1.5–4.5)
Glucose: 74 mg/dL (ref 70–99)
Potassium: 4.1 mmol/L (ref 3.5–5.2)
Sodium: 140 mmol/L (ref 134–144)
Total Protein: 6.7 g/dL (ref 6.0–8.5)
eGFR: 102 mL/min/{1.73_m2} (ref >59)

## 2022-09-28 LAB — LIPID PANEL
Chol/HDL Ratio: 4.8 ratio — ABNORMAL HIGH (ref 0.0–4.4)
Cholesterol, Total: 228 mg/dL — ABNORMAL HIGH (ref 100–199)
HDL: 48 mg/dL (ref >39)
LDL Chol Calc (NIH): 142 mg/dL — ABNORMAL HIGH (ref 0–99)
Triglycerides: 210 mg/dL — ABNORMAL HIGH (ref 0–149)
VLDL Cholesterol Cal: 38 mg/dL (ref 5–40)

## 2022-09-28 LAB — CARDIOVASCULAR RISK ASSESSMENT

## 2022-09-30 DIAGNOSIS — Z0000 Encounter for general adult medical examination without abnormal findings: Secondary | ICD-10-CM | POA: Insufficient documentation

## 2022-09-30 DIAGNOSIS — Z1231 Encounter for screening mammogram for malignant neoplasm of breast: Secondary | ICD-10-CM | POA: Insufficient documentation

## 2022-09-30 DIAGNOSIS — E669 Obesity, unspecified: Secondary | ICD-10-CM | POA: Insufficient documentation

## 2022-09-30 DIAGNOSIS — E66812 Obesity, class 2: Secondary | ICD-10-CM | POA: Insufficient documentation

## 2022-09-30 DIAGNOSIS — F17200 Nicotine dependence, unspecified, uncomplicated: Secondary | ICD-10-CM | POA: Insufficient documentation

## 2022-09-30 DIAGNOSIS — E6609 Other obesity due to excess calories: Secondary | ICD-10-CM | POA: Insufficient documentation

## 2022-09-30 DIAGNOSIS — F330 Major depressive disorder, recurrent, mild: Secondary | ICD-10-CM | POA: Insufficient documentation

## 2022-09-30 DIAGNOSIS — F332 Major depressive disorder, recurrent severe without psychotic features: Secondary | ICD-10-CM | POA: Insufficient documentation

## 2022-09-30 NOTE — Assessment & Plan Note (Signed)
Start omeprazole 40 mg daily

## 2022-09-30 NOTE — Assessment & Plan Note (Addendum)
Cessation recommended.  Rx: chantix sent. Education given.

## 2022-09-30 NOTE — Assessment & Plan Note (Signed)
>>  ASSESSMENT AND PLAN FOR TOBACCO USE DISORDER WRITTEN ON 09/30/2022  8:10 PM BY COX, KIRSTEN, MD  Cessation recommended.  Rx: chantix sent. Education given.

## 2022-09-30 NOTE — Assessment & Plan Note (Signed)
Previously well controlled Continue Synthroid at current dose  Recheck TSH and adjust Synthroid as indicated   

## 2022-09-30 NOTE — Assessment & Plan Note (Signed)
Check mammogram 

## 2022-09-30 NOTE — Assessment & Plan Note (Signed)
Start back on trazodone 100 mg before bed.

## 2022-09-30 NOTE — Assessment & Plan Note (Signed)
Start on phentermine 37.5 mg once daily in am.  Recommend continue to work on eating healthy diet and exercise.

## 2022-09-30 NOTE — Assessment & Plan Note (Signed)
  Restart citalopram 20 mg daily.  Restart Wellbutrin XL 150 mg once daily in a.m. for 1 to 2 weeks, then increase to 2 in AM x1 to 2 weeks, then may increase to 3 daily which was her previous prescription. I do recommend taking advantage of the counseling offered through [facility]

## 2022-09-30 NOTE — Assessment & Plan Note (Signed)
Things to do to keep yourself healthy  - Exercise at least 30-45 minutes a day, 3-4 days a week.  - Eat a low-fat diet with lots of fruits and vegetables, up to 7-9 servings per day.  - Seatbelts can save your life. Wear them always.  - Smoke detectors on every level of your home, check batteries every year.  - Eye Doctor - have an eye exam every 1-2 years  - Safe sex - if you may be exposed to STDs, use a condom.  - Alcohol -  If you drink, do it moderately, less than 2 drinks per day.  - [place]. Choose someone to speak for you if you are not able. Please bring [place] a copy when completed.  - Depression is common in our stressful world.If you're feeling down or losing interest in things you normally enjoy, please come in for a visit.  - Violence - If anyone is threatening or hurting you, please call immediately.

## 2022-09-30 NOTE — Assessment & Plan Note (Signed)
>>  ASSESSMENT AND PLAN FOR ATROPHY OF THYROID (ACQUIRED) WRITTEN ON 09/30/2022  8:07 PM BY COX, KIRSTEN, MD  Previously well controlled Continue Synthroid at current dose  Recheck TSH and adjust Synthroid as indicated

## 2022-10-01 ENCOUNTER — Other Ambulatory Visit: Payer: Self-pay

## 2022-10-02 ENCOUNTER — Encounter: Payer: Self-pay | Admitting: Family Medicine

## 2022-10-04 MED ORDER — IBUPROFEN 800 MG PO TABS
800.0000 mg | ORAL_TABLET | Freq: Three times a day (TID) | ORAL | 0 refills | Status: DC | PRN
  Filled 2022-10-10: qty 90, 30d supply, fill #0

## 2022-10-10 ENCOUNTER — Other Ambulatory Visit: Payer: Self-pay

## 2022-10-16 ENCOUNTER — Encounter: Payer: Self-pay | Admitting: Family Medicine

## 2022-10-21 ENCOUNTER — Other Ambulatory Visit: Payer: Self-pay | Admitting: Family Medicine

## 2022-10-21 DIAGNOSIS — E6609 Other obesity due to excess calories: Secondary | ICD-10-CM

## 2022-10-21 DIAGNOSIS — F17200 Nicotine dependence, unspecified, uncomplicated: Secondary | ICD-10-CM

## 2022-10-22 ENCOUNTER — Other Ambulatory Visit: Payer: Self-pay | Admitting: Family Medicine

## 2022-10-22 ENCOUNTER — Other Ambulatory Visit: Payer: Self-pay

## 2022-10-22 MED ORDER — PHENTERMINE HCL 37.5 MG PO CAPS
37.5000 mg | ORAL_CAPSULE | Freq: Every morning | ORAL | 0 refills | Status: DC
  Filled 2022-10-22 – 2022-10-25 (×4): qty 30, 30d supply, fill #0

## 2022-10-22 MED ORDER — VARENICLINE TARTRATE 1 MG PO TABS
1.0000 mg | ORAL_TABLET | Freq: Two times a day (BID) | ORAL | 1 refills | Status: DC
  Filled 2022-10-22: qty 168, 84d supply, fill #0
  Filled 2023-01-13: qty 168, 84d supply, fill #1

## 2022-10-23 ENCOUNTER — Other Ambulatory Visit: Payer: Self-pay

## 2022-10-23 ENCOUNTER — Other Ambulatory Visit: Payer: Self-pay | Admitting: Family Medicine

## 2022-10-23 MED ORDER — FLUTICASONE PROPIONATE 50 MCG/ACT NA SUSP
1.0000 | Freq: Every day | NASAL | 0 refills | Status: DC
  Filled 2022-10-23: qty 16, 60d supply, fill #0

## 2022-10-24 ENCOUNTER — Other Ambulatory Visit: Payer: Self-pay

## 2022-10-25 ENCOUNTER — Other Ambulatory Visit: Payer: Self-pay

## 2022-10-30 ENCOUNTER — Encounter: Payer: Self-pay | Admitting: Family Medicine

## 2022-11-12 ENCOUNTER — Other Ambulatory Visit: Payer: Self-pay

## 2022-11-23 ENCOUNTER — Other Ambulatory Visit: Payer: Self-pay | Admitting: Family Medicine

## 2022-11-23 ENCOUNTER — Other Ambulatory Visit: Payer: Self-pay

## 2022-11-23 DIAGNOSIS — E6609 Other obesity due to excess calories: Secondary | ICD-10-CM

## 2022-11-23 MED ORDER — PHENTERMINE HCL 37.5 MG PO CAPS
37.5000 mg | ORAL_CAPSULE | Freq: Every morning | ORAL | 0 refills | Status: DC
  Filled 2022-11-23: qty 30, 30d supply, fill #0

## 2022-12-02 ENCOUNTER — Other Ambulatory Visit: Payer: Self-pay | Admitting: Family Medicine

## 2022-12-03 ENCOUNTER — Other Ambulatory Visit: Payer: Self-pay

## 2022-12-03 MED ORDER — FLUTICASONE PROPIONATE 50 MCG/ACT NA SUSP
1.0000 | Freq: Every day | NASAL | 0 refills | Status: DC
  Filled 2022-12-03 – 2022-12-21 (×3): qty 16, 60d supply, fill #0

## 2022-12-20 ENCOUNTER — Other Ambulatory Visit: Payer: Self-pay | Admitting: Family Medicine

## 2022-12-20 ENCOUNTER — Other Ambulatory Visit: Payer: Self-pay

## 2022-12-20 DIAGNOSIS — E6609 Other obesity due to excess calories: Secondary | ICD-10-CM

## 2022-12-20 MED FILL — Phentermine HCl Cap 37.5 MG: ORAL | 30 days supply | Qty: 30 | Fill #0 | Status: AC

## 2022-12-21 ENCOUNTER — Other Ambulatory Visit: Payer: Self-pay

## 2023-01-01 ENCOUNTER — Other Ambulatory Visit: Payer: Self-pay

## 2023-01-01 MED ORDER — SODIUM FLUORIDE 1.1 % DT GEL
DENTAL | 3 refills | Status: DC
  Filled 2023-01-01: qty 56, 30d supply, fill #0
  Filled 2023-01-26: qty 56, 30d supply, fill #1
  Filled 2023-03-19: qty 56, 30d supply, fill #2

## 2023-01-02 NOTE — Progress Notes (Signed)
Subjective:  Patient ID: Nichole Gonzalez, female    DOB: 05-Jan-1976  Age: 47 y.o. MRN: [id number]  Chief Complaint  Patient presents with   Gastroesophageal Reflux   Hyperlipidemia    HPI  Gastroesophageal reflux disease, unspecified whether esophagitis present Omeprazole 40 mg daily. Controlled.   Mixed hyperlipidemia Was on Crestor 10 mg daily, but stopped it due to gi upset.  Works out at gym. Decreased appetite. Eats breakfast and lunch. No supper.   Depression, major, recurrent, moderate (HCC) Wellbutrin 150 mg 3 in am, trazodone 100 mg at bedtime and Celexa 20 mg once daily      01/03/2023    7:41 AM 01/03/2023    7:40 AM 09/27/2022    9:02 AM  PHQ9 SCORE ONLY  PHQ-9 Total Score 5 0 21    IBS-D/C: takes dicyclomine 20 mg once daily in am.   Wt management: Increased her vegetables and fruit. Does not eat a lot of meat. Does not eat dinner. Started working out at [employer] (bicycle and crunches.) Quit smoking 10/24/2022. Used chantix. Nauseaus in am.  Not drinking any alcohol. Drinking a lot of water. No sodas.   Patient feels exhausted often, but is yawning all day.  Sleeps 7-8 hrs per day.  On phentermine 37.5 mg once daily in am.   Needs to take a lot of excedrin for her neck and headache. Patient saw chiropractor. They did a neck xray. (DDD. Foraminal narrowing per patient.) Patient saw [facility] chiropractor.  Migraines: has aura with seeing spots. Associated with nausea, but no vomiting. Photophobia sometimes. Migraines 1-2 per week. Last few hours. Does not miss work.  Eyes have been burning for the last couple of days.   Current Outpatient Medications on File Prior to Visit  Medication Sig Dispense Refill   aspirin-acetaminophen-caffeine (EXCEDRIN EXTRA STRENGTH) 250-250-65 MG per tablet Take 2 tablets by mouth every 6 (six) hours as needed for headache (and pain).     buPROPion (WELLBUTRIN XL) 150 MG 24 hr tablet Take 3 tablets (450 mg total) by mouth daily.  270 tablet 1   cetirizine (ZYRTEC ALLERGY) 10 MG tablet Take 1 tablet (10 mg total) by mouth daily. 90 tablet 3   citalopram (CELEXA) 20 MG tablet Take 1 tablet (20 mg total) by mouth daily. 90 tablet 1   dicyclomine (BENTYL) 20 MG tablet Take 1 tablet (20 mg total) by mouth in the morning and at bedtime. 180 tablet 1   EPIPEN 2-PAK 0.3 MG/0.3ML SOAJ injection   0   fluticasone (FLONASE) 50 MCG/ACT nasal spray Place 1 spray into both nostrils daily. 16 g 0   omeprazole (PRILOSEC) 40 MG capsule Take 1 capsule (40 mg total) by mouth daily. 90 capsule 3   phentermine 37.5 MG capsule Take 1 capsule (37.5 mg total) by mouth every morning. 30 capsule 0   sodium fluoride (PREVIDENT 5000 DRY MOUTH) 1.1 % GEL dental gel Apply a thin ribbon to toothbrush. Brush teeth thoroughly at bedtime for two minutes. Do not eat or drink for 30 min after use. 56 g 3   traZODone (DESYREL) 100 MG tablet Take 1 tablet (100 mg total) by mouth at bedtime. 90 tablet 1   varenicline (CHANTIX CONTINUING MONTH PAK) 1 MG tablet Take 1 tablet (1 mg total) by mouth 2 (two) times daily. 180 tablet 1   No current facility-administered medications on file prior to visit.   Past Medical History:  Diagnosis Date   Abnormal thyroid stimulating hormone (TSH)  level 02/17/2015   Abnormal uterine bleeding 10/07/2020   Allergic rhinitis 09/03/2015   Depression, recurrent ([place]) 09/08/2020   Gastroesophageal reflux disease 10/27/2020   High risk sexual behavior 11/06/2020   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   Primary insomnia 09/08/2020   Tobacco abuse 09/03/2015   Past Surgical History:  Procedure Laterality Date   ABDOMINAL HYSTERECTOMY     APPENDECTOMY     CHOLECYSTECTOMY     DILATION AND CURETTAGE, DIAGNOSTIC / THERAPEUTIC     JOINT REPLACEMENT     KNEE ARTHROSCOPY     right foot     TUBAL LIGATION      Family History  Problem Relation Age of Onset   Thyroid disease Sister    Coronary artery disease Father    Heart attack Father     COPD Sister    Diabetes Mellitus II Sister    Social History   Socioeconomic History   Marital status: Married    Spouse name: Not on file   Number of children: 3   Years of education: Not on file   Highest education level: Not on file  Occupational History   Not on file  Tobacco Use   Smoking status: Every Day    Packs/day: 1.00    Years: 30.00    Total pack years: 30.00    Types: Cigarettes   Smokeless tobacco: Never  Substance and Sexual Activity   Alcohol use: Yes    Alcohol/week: 8.0 standard drinks of alcohol    Types: 8 Standard drinks or equivalent per week    Comment: socially   Drug use: Never   Sexual activity: Not on file  Other Topics Concern   Not on file  Social History Narrative   ** Merged History Encounter **       Social Determinants of Health   Financial Resource Strain: Low Risk  (09/27/2022)   Overall Financial Resource Strain (CARDIA)    Difficulty of Paying Living Expenses: Not hard at all  Food Insecurity: No Food Insecurity (09/27/2022)   Hunger Vital Sign    Worried About Running Out of Food in the Last Year: Never true    Ran Out of Food in the Last Year: Never true  Transportation Needs: No Transportation Needs (09/27/2022)   PRAPARE - Hydrologist (Medical): No    Lack of Transportation (Non-Medical): No  Physical Activity: Inactive (09/27/2022)   Exercise Vital Sign    Days of Exercise per Week: 0 days    Minutes of Exercise per Session: 0 min  Stress: No Stress Concern Present (09/27/2022)   [place]    Feeling of Stress : Only a little  Social Connections: Moderately Isolated (09/27/2022)   Social Connection and Isolation Panel [NHANES]    Frequency of Communication with Friends and Family: More than three times a week    Frequency of Social Gatherings with Friends and Family: More than three times a week    Attends Religious Services:  Never    Marine scientist or Organizations: No    Attends Archivist Meetings: Never    Marital Status: Married    Review of Systems  Constitutional:  Negative for chills, fatigue and fever.  HENT:  Positive for congestion. Negative for ear pain and sore throat.        Dry mouth  Respiratory:  Negative for cough and shortness of breath.   Cardiovascular:  Positive for  chest pain (sharp pain lasted 2 minutes at rest .). Negative for palpitations.  Gastrointestinal:  Positive for abdominal pain (sometimes.). Negative for constipation, diarrhea, nausea and vomiting.  Endocrine: Positive for polyphagia. Negative for polydipsia and polyuria.  Genitourinary:  Negative for difficulty urinating and dysuria.  Musculoskeletal:  Positive for neck pain. Negative for arthralgias, back pain and myalgias.  Skin:  Negative for rash.  Neurological:  Negative for headaches.  Psychiatric/Behavioral:  Negative for dysphoric mood. The patient is not nervous/anxious.      Objective:  BP 132/80   Pulse 92   Temp (!) 96.5 F (35.8 C)   Ht 5\' 6"  (1.676 m)   Wt 215 lb (97.5 kg)   LMP 01/04/2016 (Approximate)   SpO2 99%   BMI 34.70 kg/m      01/03/2023    7:40 AM 09/27/2022    8:11 PM 05/19/2021   11:17 AM  BP/Weight  Systolic BP 132 120 118  Diastolic BP 80 80 82  Wt. (Lbs) 215 198 197  BMI 34.7 kg/m2 31.96 kg/m2 31.8 kg/m2    Physical Exam Vitals reviewed.  Constitutional:      Appearance: Normal appearance. She is obese.  HENT:     Right Ear: Tympanic membrane, ear canal and external ear normal.     Left Ear: Tympanic membrane, ear canal and external ear normal.     Nose: Nose normal.     Mouth/Throat:     Pharynx: Oropharynx is clear.  Cardiovascular:     Rate and Rhythm: Normal rate and regular rhythm.     Heart sounds: Normal heart sounds. No murmur heard. Pulmonary:     Effort: Pulmonary effort is normal. No respiratory distress.     Breath sounds: Normal breath  sounds.  Abdominal:     General: Bowel sounds are normal.     Palpations: Abdomen is soft.     Tenderness: There is no abdominal tenderness.  Musculoskeletal:        General: Tenderness (left side of neck.) present.  Lymphadenopathy:     Cervical: No cervical adenopathy.  Neurological:     Mental Status: She is alert and oriented to person, place, and time.  Psychiatric:        Mood and Affect: Mood normal.        Behavior: Behavior normal.     Diabetic Foot Exam - Simple   No data filed      Lab Results  Component Value Date   WBC 12.2 (H) 01/03/2023   HGB 14.5 01/03/2023   HCT 41.5 01/03/2023   PLT 301 01/03/2023   GLUCOSE 81 01/03/2023   CHOL 209 (H) 01/03/2023   TRIG 157 (H) 01/03/2023   HDL 59 01/03/2023   LDLCALC 122 (H) 01/03/2023   ALT 14 01/03/2023   AST 16 01/03/2023   NA 138 01/03/2023   K 3.7 01/03/2023   CL 97 01/03/2023   CREATININE 0.83 01/03/2023   BUN 6 01/03/2023   CO2 23 01/03/2023   TSH 1.200 01/03/2023      Assessment & Plan:   Problem List Items Addressed This Visit       Cardiovascular and Mediastinum   Migraine without aura and without status migrainosus, not intractable    Start topamax. Ramp up over one month.  Start imitrex for acute migraines.  Recommend change excedrin to tylenol for neck and tension headaches.      Relevant Medications   topiramate (TOPAMAX) 25 MG tablet   SUMAtriptan (IMITREX)  50 MG tablet   cyclobenzaprine (FLEXERIL) 10 MG tablet     Digestive   Gastroesophageal reflux disease    Continue omeprazole 40 mg daily.      Relevant Orders   CBC with Differential/Platelet (Completed)   Xerostomia    Check labs      Relevant Medications   SUMAtriptan (IMITREX) 50 MG tablet   Other Relevant Orders   Sjogren's syndrome antibods(ssa + ssb) (Completed)     Endocrine   Hyperthyroidism - Primary    Check tsh      Relevant Orders   TSH (Completed)     Other   Tobacco abuse    Recommend  cessation.  Continue chantix.       Primary insomnia    Neck pain: Flexeril 10 mg one at night.       Mild recurrent major depression (HCC)    The current medical regimen is effective;  continue present plan and medications. Continue Wellbutrin 150 mg 3 in am, trazodone 100 mg at bedtime and Celexa 20 mg once daily        Class 1 obesity with serious comorbidity and body mass index (BMI) of 34.0 to 34.9 in adult    Start topamax. Ramp up over one month.  Start imitrex for acute migraines.  Recommend change excedrin to tylenol for neck and tension headaches.       Mixed hyperlipidemia   Relevant Orders   Comprehensive metabolic panel (Completed)   Lipid panel (Completed)   Cardiovascular Risk Assessment (Completed)   Daytime somnolence    Check sleep study      Relevant Orders   Home sleep test   Joint pain    Arthritis panel      Relevant Medications   cyclobenzaprine (FLEXERIL) 10 MG tablet   Other Relevant Orders   Rheumatoid factor (Completed)   CYCLIC CITRUL PEPTIDE ANTIBODY, IGG/IGA (Completed)   Sedimentation rate (Completed)   C-reactive protein (Completed)   ANA w/Reflex (Completed)   Neck pain    Flexeril 10 mg one at night.      .  Meds ordered this encounter  Medications   topiramate (TOPAMAX) 25 MG tablet    Sig: Take 1 tablet (25 mg total) by mouth at bedtime for 7 days, THEN 1 tablet (25 mg total) 2 (two) times daily for 23 days.    Dispense:  53 tablet    Refill:  0   SUMAtriptan (IMITREX) 50 MG tablet    Sig: Take one at onset of aura/migraine, may repeat in 2 hrs x 1.    Dispense:  9 tablet    Refill:  1   cyclobenzaprine (FLEXERIL) 10 MG tablet    Sig: Take 1 tablet (10 mg total) by mouth at bedtime.    Dispense:  90 tablet    Refill:  0    Orders Placed This Encounter  Procedures   Comprehensive metabolic panel   Lipid panel   CBC with Differential/Platelet   TSH   Rheumatoid factor   CYCLIC CITRUL PEPTIDE ANTIBODY, IGG/IGA    Sedimentation rate   C-reactive protein   ANA w/Reflex   Sjogren's syndrome antibods(ssa + ssb)   Cardiovascular Risk Assessment   Home sleep test     Follow-up: Return in about 6 weeks (around 02/14/2023) for with me or Rekha if I have nothing available. , chronic follow up.  An After Visit Summary was printed and given to the patient.  Blane Ohara, MD [facility] (  [phone]

## 2023-01-03 ENCOUNTER — Encounter: Payer: Self-pay | Admitting: Family Medicine

## 2023-01-03 ENCOUNTER — Ambulatory Visit: Payer: Commercial Managed Care - PPO | Admitting: Family Medicine

## 2023-01-03 ENCOUNTER — Other Ambulatory Visit: Payer: Self-pay

## 2023-01-03 VITALS — BP 132/80 | HR 92 | Temp 96.5°F | Ht 66.0 in | Wt 215.0 lb

## 2023-01-03 DIAGNOSIS — Z720 Tobacco use: Secondary | ICD-10-CM

## 2023-01-03 DIAGNOSIS — F330 Major depressive disorder, recurrent, mild: Secondary | ICD-10-CM

## 2023-01-03 DIAGNOSIS — K117 Disturbances of salivary secretion: Secondary | ICD-10-CM | POA: Diagnosis not present

## 2023-01-03 DIAGNOSIS — G43009 Migraine without aura, not intractable, without status migrainosus: Secondary | ICD-10-CM | POA: Diagnosis not present

## 2023-01-03 DIAGNOSIS — F5101 Primary insomnia: Secondary | ICD-10-CM

## 2023-01-03 DIAGNOSIS — E782 Mixed hyperlipidemia: Secondary | ICD-10-CM

## 2023-01-03 DIAGNOSIS — E6609 Other obesity due to excess calories: Secondary | ICD-10-CM | POA: Diagnosis not present

## 2023-01-03 DIAGNOSIS — E0590 Thyrotoxicosis, unspecified without thyrotoxic crisis or storm: Secondary | ICD-10-CM

## 2023-01-03 DIAGNOSIS — K219 Gastro-esophageal reflux disease without esophagitis: Secondary | ICD-10-CM | POA: Diagnosis not present

## 2023-01-03 DIAGNOSIS — F332 Major depressive disorder, recurrent severe without psychotic features: Secondary | ICD-10-CM

## 2023-01-03 DIAGNOSIS — M2559 Pain in other specified joint: Secondary | ICD-10-CM

## 2023-01-03 DIAGNOSIS — Z6834 Body mass index (BMI) 34.0-34.9, adult: Secondary | ICD-10-CM

## 2023-01-03 DIAGNOSIS — R400 Somnolence: Secondary | ICD-10-CM

## 2023-01-03 DIAGNOSIS — M542 Cervicalgia: Secondary | ICD-10-CM

## 2023-01-03 MED ORDER — TOPIRAMATE 25 MG PO TABS
ORAL_TABLET | ORAL | 0 refills | Status: DC
  Filled 2023-01-03: qty 53, 30d supply, fill #0

## 2023-01-03 MED ORDER — SUMATRIPTAN SUCCINATE 50 MG PO TABS
ORAL_TABLET | ORAL | 1 refills | Status: DC
  Filled 2023-01-03: qty 9, 15d supply, fill #0
  Filled 2023-01-13: qty 9, 15d supply, fill #1

## 2023-01-03 NOTE — Patient Instructions (Addendum)
Migraine prevention/weight loss: Start topamax. Ramp up over one month.   Neck pain: Flexeril 10 mg one at night.   Start imitrex for acute migraines.   Recommend change excedrin to tylenol for neck and tension headaches.  Ordering a home sleep study.

## 2023-01-04 LAB — CBC WITH DIFFERENTIAL/PLATELET
Basophils Absolute: 0.1 10*3/uL (ref 0.0–0.2)
Basos: 0 %
EOS (ABSOLUTE): 0 10*3/uL (ref 0.0–0.4)
Eos: 0 %
Hematocrit: 41.5 % (ref 34.0–46.6)
Hemoglobin: 14.5 g/dL (ref 11.1–15.9)
Immature Grans (Abs): 0 10*3/uL (ref 0.0–0.1)
Immature Granulocytes: 0 %
Lymphocytes Absolute: 3.2 10*3/uL — ABNORMAL HIGH (ref 0.7–3.1)
Lymphs: 26 %
MCH: 32.8 pg (ref 26.6–33.0)
MCHC: 34.9 g/dL (ref 31.5–35.7)
MCV: 94 fL (ref 79–97)
Monocytes Absolute: 0.4 10*3/uL (ref 0.1–0.9)
Monocytes: 4 %
Neutrophils Absolute: 8.5 10*3/uL — ABNORMAL HIGH (ref 1.4–7.0)
Neutrophils: 70 %
Platelets: 301 10*3/uL (ref 150–450)
RBC: 4.42 x10E6/uL (ref 3.77–5.28)
RDW: 11.5 % — ABNORMAL LOW (ref 11.7–15.4)
WBC: 12.2 10*3/uL — ABNORMAL HIGH (ref 3.4–10.8)

## 2023-01-04 LAB — COMPREHENSIVE METABOLIC PANEL
ALT: 14 IU/L (ref 0–32)
AST: 16 IU/L (ref 0–40)
Albumin/Globulin Ratio: 1.7 (ref 1.2–2.2)
Albumin: 4.3 g/dL (ref 3.9–4.9)
Alkaline Phosphatase: 97 IU/L (ref 44–121)
BUN/Creatinine Ratio: 7 — ABNORMAL LOW (ref 9–23)
BUN: 6 mg/dL (ref 6–24)
Bilirubin Total: 0.2 mg/dL (ref 0.0–1.2)
CO2: 23 mmol/L (ref 20–29)
Calcium: 9.8 mg/dL (ref 8.7–10.2)
Chloride: 97 mmol/L (ref 96–106)
Creatinine, Ser: 0.83 mg/dL (ref 0.57–1.00)
Globulin, Total: 2.6 g/dL (ref 1.5–4.5)
Glucose: 81 mg/dL (ref 70–99)
Potassium: 3.7 mmol/L (ref 3.5–5.2)
Sodium: 138 mmol/L (ref 134–144)
Total Protein: 6.9 g/dL (ref 6.0–8.5)
eGFR: 87 mL/min/{1.73_m2} (ref >59)

## 2023-01-04 LAB — C-REACTIVE PROTEIN: CRP: 10 mg/L (ref 0–10)

## 2023-01-04 LAB — SJOGREN'S SYNDROME ANTIBODS(SSA + SSB)
ENA SSA (RO) Ab: <0.2 AI (ref 0.0–0.9)
ENA SSB (LA) Ab: <0.2 AI (ref 0.0–0.9)

## 2023-01-04 LAB — LIPID PANEL
Chol/HDL Ratio: 3.5 ratio (ref 0.0–4.4)
Cholesterol, Total: 209 mg/dL — ABNORMAL HIGH (ref 100–199)
HDL: 59 mg/dL (ref >39)
LDL Chol Calc (NIH): 122 mg/dL — ABNORMAL HIGH (ref 0–99)
Triglycerides: 157 mg/dL — ABNORMAL HIGH (ref 0–149)
VLDL Cholesterol Cal: 28 mg/dL (ref 5–40)

## 2023-01-04 LAB — CARDIOVASCULAR RISK ASSESSMENT

## 2023-01-04 LAB — CYCLIC CITRUL PEPTIDE ANTIBODY, IGG/IGA: Cyclic Citrullin Peptide Ab: 6 units (ref 0–19)

## 2023-01-04 LAB — ANA W/REFLEX: Anti Nuclear Antibody (ANA): NEGATIVE

## 2023-01-04 LAB — SEDIMENTATION RATE: Sed Rate: 4 mm/hr (ref 0–32)

## 2023-01-04 LAB — RHEUMATOID FACTOR: Rheumatoid fact SerPl-aCnc: 16.2 IU/mL — ABNORMAL HIGH (ref <14.0)

## 2023-01-04 LAB — TSH: TSH: 1.2 u[IU]/mL (ref 0.450–4.500)

## 2023-01-06 NOTE — Progress Notes (Signed)
Blood count abnormal.  WBC 12.2.  This has decreased but not quite normal.   Liver function normal.  Kidney function normal.  Thyroid function normal.  Cholesterol: Triglycerides elevated but improved from 210 down to 157.  LDL has improved from 142 down to 122.  Neither was at goal but both have improved. Rheumatoid factor elevated slightly.  Recommend refer to rheumatology for possible rheumatoid arthritis. Rest of arthritis panel was normal.  Sjogren's tests were negative.

## 2023-01-07 ENCOUNTER — Other Ambulatory Visit: Payer: Self-pay | Admitting: Family Medicine

## 2023-01-07 ENCOUNTER — Encounter: Payer: Self-pay | Admitting: Family Medicine

## 2023-01-07 MED ORDER — CYCLOBENZAPRINE HCL 10 MG PO TABS
10.0000 mg | ORAL_TABLET | Freq: Every day | ORAL | 0 refills | Status: DC
  Filled 2023-01-08: qty 90, 90d supply, fill #0

## 2023-01-08 ENCOUNTER — Other Ambulatory Visit: Payer: Self-pay

## 2023-01-09 NOTE — Assessment & Plan Note (Signed)
Continue omeprazole 40 mg daily

## 2023-01-09 NOTE — Assessment & Plan Note (Signed)
The current medical regimen is effective;  continue present plan and medications. Continue Wellbutrin 150 mg 3 in am, trazodone 100 mg at bedtime and Celexa 20 mg once daily

## 2023-01-09 NOTE — Assessment & Plan Note (Signed)
Recommend cessation.  Continue chantix.

## 2023-01-10 ENCOUNTER — Other Ambulatory Visit: Payer: Self-pay | Admitting: Family Medicine

## 2023-01-10 DIAGNOSIS — K117 Disturbances of salivary secretion: Secondary | ICD-10-CM | POA: Insufficient documentation

## 2023-01-10 DIAGNOSIS — M2550 Pain in unspecified joint: Secondary | ICD-10-CM | POA: Insufficient documentation

## 2023-01-10 DIAGNOSIS — E782 Mixed hyperlipidemia: Secondary | ICD-10-CM | POA: Insufficient documentation

## 2023-01-10 DIAGNOSIS — M2559 Pain in other specified joint: Secondary | ICD-10-CM

## 2023-01-10 DIAGNOSIS — G43009 Migraine without aura, not intractable, without status migrainosus: Secondary | ICD-10-CM | POA: Insufficient documentation

## 2023-01-10 DIAGNOSIS — R400 Somnolence: Secondary | ICD-10-CM | POA: Insufficient documentation

## 2023-01-10 DIAGNOSIS — M542 Cervicalgia: Secondary | ICD-10-CM | POA: Insufficient documentation

## 2023-01-10 NOTE — Assessment & Plan Note (Signed)
Check tsh 

## 2023-01-10 NOTE — Assessment & Plan Note (Signed)
Start topamax. Ramp up over one month.  Start imitrex for acute migraines.  Recommend change excedrin to tylenol for neck and tension headaches.

## 2023-01-10 NOTE — Assessment & Plan Note (Signed)
Arthritis panel. 

## 2023-01-10 NOTE — Assessment & Plan Note (Signed)
Flexeril 10 mg one at night.

## 2023-01-10 NOTE — Assessment & Plan Note (Signed)
Check labs 

## 2023-01-10 NOTE — Assessment & Plan Note (Signed)
Check sleep study

## 2023-01-10 NOTE — Assessment & Plan Note (Addendum)
Start topamax. Ramp up over one month.  Start imitrex for acute migraines.  Recommend change excedrin to tylenol for neck and tension headaches. 

## 2023-01-10 NOTE — Assessment & Plan Note (Signed)
Neck pain: Flexeril 10 mg one at night.

## 2023-01-14 ENCOUNTER — Other Ambulatory Visit: Payer: Self-pay

## 2023-01-15 ENCOUNTER — Other Ambulatory Visit: Payer: Self-pay

## 2023-01-18 ENCOUNTER — Other Ambulatory Visit: Payer: Self-pay | Admitting: Family Medicine

## 2023-01-18 DIAGNOSIS — E6609 Other obesity due to excess calories: Secondary | ICD-10-CM

## 2023-01-19 ENCOUNTER — Other Ambulatory Visit: Payer: Self-pay

## 2023-01-19 ENCOUNTER — Other Ambulatory Visit: Payer: Self-pay | Admitting: Family Medicine

## 2023-01-19 DIAGNOSIS — E6609 Other obesity due to excess calories: Secondary | ICD-10-CM

## 2023-01-21 ENCOUNTER — Other Ambulatory Visit: Payer: Self-pay

## 2023-01-21 ENCOUNTER — Other Ambulatory Visit: Payer: Self-pay | Admitting: Family Medicine

## 2023-01-21 DIAGNOSIS — G43009 Migraine without aura, not intractable, without status migrainosus: Secondary | ICD-10-CM

## 2023-01-21 MED ORDER — TOPIRAMATE 50 MG PO TABS
50.0000 mg | ORAL_TABLET | Freq: Every day | ORAL | 0 refills | Status: DC
  Filled 2023-01-21: qty 30, 30d supply, fill #0

## 2023-01-21 MED FILL — Phentermine HCl Cap 37.5 MG: ORAL | 30 days supply | Qty: 30 | Fill #0 | Status: AC

## 2023-01-22 ENCOUNTER — Other Ambulatory Visit: Payer: Self-pay

## 2023-01-22 MED ORDER — AMOXICILLIN 500 MG PO CAPS
500.0000 mg | ORAL_CAPSULE | Freq: Three times a day (TID) | ORAL | 0 refills | Status: DC
  Filled 2023-01-22: qty 15, 5d supply, fill #0

## 2023-01-22 MED ORDER — TRAMADOL HCL 50 MG PO TABS
50.0000 mg | ORAL_TABLET | Freq: Four times a day (QID) | ORAL | 0 refills | Status: DC | PRN
  Filled 2023-01-22: qty 6, 2d supply, fill #0

## 2023-01-22 MED ORDER — LORAZEPAM 2 MG PO TABS
2.0000 mg | ORAL_TABLET | ORAL | 0 refills | Status: DC
  Filled 2023-01-22: qty 1, 1d supply, fill #0

## 2023-01-26 ENCOUNTER — Other Ambulatory Visit: Payer: Self-pay | Admitting: Family Medicine

## 2023-01-26 DIAGNOSIS — K117 Disturbances of salivary secretion: Secondary | ICD-10-CM

## 2023-01-27 ENCOUNTER — Other Ambulatory Visit: Payer: Self-pay | Admitting: Family Medicine

## 2023-01-27 ENCOUNTER — Other Ambulatory Visit: Payer: Self-pay

## 2023-01-27 DIAGNOSIS — K117 Disturbances of salivary secretion: Secondary | ICD-10-CM

## 2023-01-28 ENCOUNTER — Other Ambulatory Visit: Payer: Self-pay

## 2023-01-28 MED ORDER — AMOXICILLIN 500 MG PO CAPS
500.0000 mg | ORAL_CAPSULE | Freq: Three times a day (TID) | ORAL | 0 refills | Status: DC
  Filled 2023-01-28: qty 15, 5d supply, fill #0

## 2023-01-29 ENCOUNTER — Encounter: Payer: Self-pay | Admitting: Family Medicine

## 2023-01-30 ENCOUNTER — Other Ambulatory Visit: Payer: Self-pay

## 2023-01-30 DIAGNOSIS — R400 Somnolence: Secondary | ICD-10-CM

## 2023-02-03 ENCOUNTER — Other Ambulatory Visit: Payer: Self-pay | Admitting: Family Medicine

## 2023-02-03 DIAGNOSIS — K117 Disturbances of salivary secretion: Secondary | ICD-10-CM

## 2023-02-03 DIAGNOSIS — G43009 Migraine without aura, not intractable, without status migrainosus: Secondary | ICD-10-CM

## 2023-02-03 MED ORDER — TOPIRAMATE 50 MG PO TABS
50.0000 mg | ORAL_TABLET | Freq: Every day | ORAL | 0 refills | Status: DC
  Filled 2023-02-11: qty 90, 90d supply, fill #0

## 2023-02-03 MED ORDER — SUMATRIPTAN SUCCINATE 50 MG PO TABS
ORAL_TABLET | ORAL | 2 refills | Status: DC
  Filled 2023-02-11: qty 9, 15d supply, fill #0
  Filled 2023-02-22: qty 9, 15d supply, fill #1
  Filled 2023-03-19: qty 9, 15d supply, fill #2

## 2023-02-04 ENCOUNTER — Other Ambulatory Visit: Payer: Self-pay

## 2023-02-05 ENCOUNTER — Other Ambulatory Visit: Payer: Self-pay

## 2023-02-05 MED ORDER — ONDANSETRON 4 MG PO TBDP
4.0000 mg | ORAL_TABLET | Freq: Three times a day (TID) | ORAL | 0 refills | Status: DC | PRN
  Filled 2023-02-05: qty 6, 2d supply, fill #0

## 2023-02-05 MED ORDER — HYDROCODONE-ACETAMINOPHEN 5-325 MG PO TABS
1.0000 | ORAL_TABLET | Freq: Four times a day (QID) | ORAL | 0 refills | Status: DC | PRN
  Filled 2023-02-05: qty 8, 2d supply, fill #0

## 2023-02-11 ENCOUNTER — Other Ambulatory Visit: Payer: Self-pay

## 2023-02-12 ENCOUNTER — Other Ambulatory Visit: Payer: Self-pay | Admitting: Family Medicine

## 2023-02-12 ENCOUNTER — Other Ambulatory Visit: Payer: Self-pay

## 2023-02-12 MED ORDER — FLUTICASONE PROPIONATE 50 MCG/ACT NA SUSP
1.0000 | Freq: Every day | NASAL | 0 refills | Status: DC
  Filled 2023-02-12: qty 16, 60d supply, fill #0

## 2023-02-14 ENCOUNTER — Encounter: Payer: Self-pay | Admitting: Family Medicine

## 2023-02-14 ENCOUNTER — Telehealth: Payer: Self-pay

## 2023-02-14 ENCOUNTER — Ambulatory Visit: Payer: Commercial Managed Care - PPO | Admitting: Nurse Practitioner

## 2023-02-14 NOTE — Telephone Encounter (Signed)
CPAP resulted on 01/31/23 (was hand written on CPAP results) results scanned into system prior to Dr. Tobie Poet notating. Per Dr. Tobie Poet results were abnormal c/w OSA mod.

## 2023-02-18 ENCOUNTER — Other Ambulatory Visit: Payer: Self-pay | Admitting: Family Medicine

## 2023-02-18 ENCOUNTER — Other Ambulatory Visit: Payer: Self-pay

## 2023-02-18 DIAGNOSIS — E6609 Other obesity due to excess calories: Secondary | ICD-10-CM

## 2023-02-19 ENCOUNTER — Other Ambulatory Visit: Payer: Self-pay

## 2023-02-19 MED FILL — Phentermine HCl Cap 37.5 MG: ORAL | 30 days supply | Qty: 30 | Fill #0 | Status: AC

## 2023-02-20 ENCOUNTER — Other Ambulatory Visit: Payer: Self-pay

## 2023-02-21 ENCOUNTER — Other Ambulatory Visit: Payer: Self-pay

## 2023-02-24 ENCOUNTER — Other Ambulatory Visit: Payer: Self-pay

## 2023-02-25 NOTE — Progress Notes (Unsigned)
Subjective:  Patient ID: Nichole Gonzalez, female    DOB: 18-Aug-1976  Age: 47 y.o. MRN: [id number]  No chief complaint on file.    History of Present illness:  Patient is here for  a follow up after started on migraine medicine on her last visit. She was started Topamax 25 mg OD and immitrex 50 mg as needed   Current Outpatient Medications on File Prior to Visit  Medication Sig Dispense Refill   amoxicillin (AMOXIL) 500 MG capsule Take 1 capsule (500 mg total) by mouth 3 (three) times daily. 15 capsule 0   aspirin-acetaminophen-caffeine (EXCEDRIN EXTRA STRENGTH) [id number] MG per tablet Take 2 tablets by mouth every 6 (six) hours as needed for headache (and pain).     buPROPion (WELLBUTRIN XL) 150 MG 24 hr tablet Take 3 tablets (450 mg total) by mouth daily. 270 tablet 1   cetirizine (ZYRTEC ALLERGY) 10 MG tablet Take 1 tablet (10 mg total) by mouth daily. 90 tablet 3   citalopram (CELEXA) 20 MG tablet Take 1 tablet (20 mg total) by mouth daily. 90 tablet 1   cyclobenzaprine (FLEXERIL) 10 MG tablet Take 1 tablet (10 mg total) by mouth at bedtime. 90 tablet 0   dicyclomine (BENTYL) 20 MG tablet Take 1 tablet (20 mg total) by mouth in the morning and at bedtime. 180 tablet 1   EPIPEN 2-PAK 0.3 MG/0.3ML SOAJ injection   0   fluticasone (FLONASE) 50 MCG/ACT nasal spray Place 1 spray into both nostrils daily. 16 g 0   HYDROcodone-acetaminophen (NORCO/VICODIN) 5-325 MG tablet Take 1 tablet by mouth every 6 (six) hours as needed for pain. 8 tablet 0   LORazepam (ATIVAN) 2 MG tablet Take 1 tablet by mouth 1 hour pre-op with sip of water only. 1 tablet 0   omeprazole (PRILOSEC) 40 MG capsule Take 1 capsule (40 mg total) by mouth daily. 90 capsule 3   ondansetron (ZOFRAN-ODT) 4 MG disintegrating tablet Dissolve one tablet and place on top of the tongue, then swallow every 8 hours as needed for nause/vomit. 6 tablet 0   phentermine 37.5 MG capsule Take 1 capsule (37.5 mg total) by mouth every  morning. 30 capsule 0   sodium fluoride (PREVIDENT 5000 DRY MOUTH) 1.1 % GEL dental gel Apply a thin ribbon to toothbrush. Brush teeth thoroughly at bedtime for two minutes. Do not eat or drink for 30 min after use. 56 g 3   SUMAtriptan (IMITREX) 50 MG tablet Take one tablet by mouth at onset of aura/migraine, may repeat in 2 hours once 9 tablet 2   topiramate (TOPAMAX) 50 MG tablet Take 1 tablet (50 mg total) by mouth at bedtime. 90 tablet 0   traMADol (ULTRAM) 50 MG tablet Take 1 tablet (50 mg total) by mouth every 6 (six) hours as needed for pain. 6 tablet 0   traZODone (DESYREL) 100 MG tablet Take 1 tablet (100 mg total) by mouth at bedtime. 90 tablet 1   varenicline (CHANTIX CONTINUING MONTH PAK) 1 MG tablet Take 1 tablet (1 mg total) by mouth 2 (two) times daily. 180 tablet 1   No current facility-administered medications on file prior to visit.   Past Medical History:  Diagnosis Date   Abnormal thyroid stimulating hormone (TSH) level 02/17/2015   Abnormal uterine bleeding 10/07/2020   Allergic rhinitis 09/03/2015   Depression, recurrent ([place]) 09/08/2020   Gastroesophageal reflux disease 10/27/2020   High risk sexual behavior 11/06/2020   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   Primary insomnia  09/08/2020   Tobacco abuse 09/03/2015   Past Surgical History:  Procedure Laterality Date   ABDOMINAL HYSTERECTOMY     APPENDECTOMY     CHOLECYSTECTOMY     DILATION AND CURETTAGE, DIAGNOSTIC / THERAPEUTIC     JOINT REPLACEMENT     KNEE ARTHROSCOPY     right foot     TUBAL LIGATION      Family History  Problem Relation Age of Onset   Thyroid disease Sister    Coronary artery disease Father    Heart attack Father    COPD Sister    Diabetes Mellitus II Sister    Social History   Socioeconomic History   Marital status: Married    Spouse name: Not on file   Number of children: 3   Years of education: Not on file   Highest education level: Not on file  Occupational History   Not on file   Tobacco Use   Smoking status: Every Day    Packs/day: 1.00    Years: 30.00    Total pack years: 30.00    Types: Cigarettes   Smokeless tobacco: Never  Substance and Sexual Activity   Alcohol use: Yes    Alcohol/week: 8.0 standard drinks of alcohol    Types: 8 Standard drinks or equivalent per week    Comment: socially   Drug use: Never   Sexual activity: Not on file  Other Topics Concern   Not on file  Social History Narrative   ** Merged History Encounter **       Social Determinants of Health   Financial Resource Strain: Low Risk  (09/27/2022)   Overall Financial Resource Strain (CARDIA)    Difficulty of Paying Living Expenses: Not hard at all  Food Insecurity: No Food Insecurity (09/27/2022)   Hunger Vital Sign    Worried About Running Out of Food in the Last Year: Never true    Ran Out of Food in the Last Year: Never true  Transportation Needs: No Transportation Needs (09/27/2022)   PRAPARE - Hydrologist (Medical): No    Lack of Transportation (Non-Medical): No  Physical Activity: Inactive (09/27/2022)   Exercise Vital Sign    Days of Exercise per Week: 0 days    Minutes of Exercise per Session: 0 min  Stress: No Stress Concern Present (09/27/2022)   [place]    Feeling of Stress : Only a little  Social Connections: Moderately Isolated (09/27/2022)   Social Connection and Isolation Panel [NHANES]    Frequency of Communication with Friends and Family: More than three times a week    Frequency of Social Gatherings with Friends and Family: More than three times a week    Attends Religious Services: Never    Marine scientist or Organizations: No    Attends Archivist Meetings: Never    Marital Status: Married    Review of Systems   Objective:  LMP 01/04/2016 (Approximate)      01/03/2023    7:40 AM 09/27/2022    8:11 PM 05/19/2021   11:17 AM  BP/Weight   Systolic BP [id number]  Diastolic BP 80 80 82  Wt. (Lbs) 215 198 197  BMI 34.7 kg/m2 31.96 kg/m2 31.8 kg/m2    Physical Exam  Diabetic Foot Exam - Simple   No data filed        01/03/2023    7:41 AM 01/03/2023  7:40 AM 09/27/2022    9:02 AM 05/19/2021   11:20 AM 10/09/2020   11:46 PM  Depression screen PHQ 2/9  Decreased Interest 0 0 '3 3 2  '$ Down, Depressed, Hopeless 0 0 2 3 0  PHQ - 2 Score 0 0 '5 6 2  '$ Altered sleeping '1  3 1 2  '$ Tired, decreased energy '2  3 3 3  '$ Change in appetite '1  2 3 3  '$ Feeling bad or failure about yourself  0  2 0 0  Trouble concentrating '1  3 3 2  '$ Moving slowly or fidgety/restless 0  3 3 0  Suicidal thoughts 0  0 0 0  PHQ-9 Score '5  21 19 12  '$ Difficult doing work/chores Somewhat difficult  Extremely dIfficult Extremely dIfficult Somewhat difficult       11/28/2020    9:33 AM 11/28/2020   10:20 AM 05/19/2021   11:20 AM  Fall Risk  Falls in the past year?   0  Was there an injury with Fall?   0  Fall Risk Category Calculator   0  Fall Risk Category (Retired)   Low  (RETIRED) Patient Fall Risk Level Low fall risk Low fall risk Low fall risk  Patient at Risk for Falls Due to   No Fall Risks  Fall risk Follow up   Falls evaluation completed    Lab Results  Component Value Date   WBC 12.2 (H) 01/03/2023   HGB 14.5 01/03/2023   HCT 41.5 01/03/2023   PLT 301 01/03/2023   GLUCOSE 81 01/03/2023   CHOL 209 (H) 01/03/2023   TRIG 157 (H) 01/03/2023   HDL 59 01/03/2023   LDLCALC 122 (H) 01/03/2023   ALT 14 01/03/2023   AST 16 01/03/2023   NA 138 01/03/2023   K 3.7 01/03/2023   CL 97 01/03/2023   CREATININE 0.83 01/03/2023   BUN 6 01/03/2023   CO2 23 01/03/2023   TSH 1.200 01/03/2023      Assessment & Plan:   There are no diagnoses linked to this encounter.   Follow-up: No follow-ups on file.  An After Visit Summary was printed and given to the patient.  I, Neil Crouch have reviewed all documentation for this visit. The documentation  on 02/25/23   for the exam, diagnosis, procedures, and orders are all accurate and complete.    Neil Crouch, DNP, [facility] [phone]

## 2023-02-26 ENCOUNTER — Other Ambulatory Visit: Payer: Self-pay

## 2023-02-26 ENCOUNTER — Encounter: Payer: Self-pay | Admitting: Nurse Practitioner

## 2023-02-26 ENCOUNTER — Ambulatory Visit (INDEPENDENT_AMBULATORY_CARE_PROVIDER_SITE_OTHER): Payer: Commercial Managed Care - PPO | Admitting: Nurse Practitioner

## 2023-02-26 VITALS — BP 110/76 | HR 72 | Temp 97.2°F | Resp 16 | Ht 66.0 in | Wt 214.6 lb

## 2023-02-26 DIAGNOSIS — K117 Disturbances of salivary secretion: Secondary | ICD-10-CM

## 2023-02-26 DIAGNOSIS — E6609 Other obesity due to excess calories: Secondary | ICD-10-CM

## 2023-02-26 DIAGNOSIS — Z6834 Body mass index (BMI) 34.0-34.9, adult: Secondary | ICD-10-CM

## 2023-02-26 DIAGNOSIS — R110 Nausea: Secondary | ICD-10-CM | POA: Diagnosis not present

## 2023-02-26 DIAGNOSIS — G43009 Migraine without aura, not intractable, without status migrainosus: Secondary | ICD-10-CM

## 2023-02-26 DIAGNOSIS — F330 Major depressive disorder, recurrent, mild: Secondary | ICD-10-CM | POA: Diagnosis not present

## 2023-02-26 DIAGNOSIS — R5383 Other fatigue: Secondary | ICD-10-CM | POA: Insufficient documentation

## 2023-02-26 MED ORDER — ONDANSETRON 4 MG PO TBDP
4.0000 mg | ORAL_TABLET | Freq: Three times a day (TID) | ORAL | 0 refills | Status: DC | PRN
  Filled 2023-02-26: qty 20, 7d supply, fill #0

## 2023-02-26 MED ORDER — SEMAGLUTIDE-WEIGHT MANAGEMENT 0.25 MG/0.5ML ~~LOC~~ SOAJ
0.2500 mg | SUBCUTANEOUS | 0 refills | Status: DC
  Filled 2023-02-26 (×2): qty 2, 28d supply, fill #0

## 2023-02-26 MED ORDER — TOPIRAMATE 100 MG PO TABS
100.0000 mg | ORAL_TABLET | Freq: Every day | ORAL | 0 refills | Status: DC
  Filled 2023-02-26: qty 90, 90d supply, fill #0

## 2023-02-26 NOTE — Assessment & Plan Note (Signed)
Iron Panel, vitamin D and Vitamin B12 will be assessed today Patient is taking OTC vitamin B12

## 2023-02-26 NOTE — Assessment & Plan Note (Signed)
sjogren's syndrome antibody (ssa+ssb) negative

## 2023-02-26 NOTE — Assessment & Plan Note (Signed)
Stop Imitrex for now d/t side effects Increased Topamax from 50 to 100 mg OD

## 2023-02-26 NOTE — Assessment & Plan Note (Signed)
PHQ 13 today  Continue Wellbutrin 150 mg 3 in am, trazodone 100 mg at bedtime and Celexa 20 mg once daily

## 2023-02-26 NOTE — Assessment & Plan Note (Signed)
Take Zofran ODT as needed

## 2023-02-26 NOTE — Patient Instructions (Addendum)
Please follow up in 3 months Will inform you regarding labs Follow up with rheumatology as scheduled Will do prior authorization for semaglutide injection and let you know about that but now keep taking your Phentermine.   Semaglutide Injection (Weight Management) What is this medication? SEMAGLUTIDE (SEM a GLOO tide) promotes weight loss. It may also be used to maintain weight loss. It works by decreasing appetite. Changes to diet and exercise are often combined with this medication. This medicine may be used for other purposes; ask your health care provider or pharmacist if you have questions. COMMON BRAND NAME(S): [id number] What should I tell my care team before I take this medication? They need to know if you have any of these conditions: Endocrine tumors (MEN 2) or if someone in your family had these tumors Eye disease, vision problems Gallbladder disease History of depression or mental health disease History of pancreatitis Kidney disease Stomach or intestine problems Suicidal thoughts, plans, or attempt; a previous suicide attempt by you or a family member Thyroid cancer or if someone in your family had thyroid cancer An unusual or allergic reaction to semaglutide, other medications, foods, dyes, or preservatives Pregnant or trying to get pregnant Breast-feeding How should I use this medication? This medication is injected under the skin. You will be taught how to prepare and give it. Take it as directed on the prescription label. It is given once every week (every 7 days). Keep taking it unless your care team tells you to stop. It is important that you put your used needles and pens in a special sharps container. Do not put them in a trash can. If you do not have a sharps container, call your pharmacist or care team to get one. A special MedGuide will be given to you by the pharmacist with each prescription and refill. Be sure to read this information carefully each time. This  medication comes with INSTRUCTIONS FOR USE. Ask your pharmacist for directions on how to use this medication. Read the information carefully. Talk to your pharmacist or care team if you have questions. Talk to your care team about the use of this medication in children. While it may be prescribed for children as young as 12 years for selected conditions, precautions do apply. Overdosage: If you think you have taken too much of this medicine contact a poison control center or emergency room at once. NOTE: This medicine is only for you. Do not share this medicine with others. What if I miss a dose? If you miss a dose and the next scheduled dose is more than 2 days away, take the missed dose as soon as possible. If you miss a dose and the next scheduled dose is less than 2 days away, do not take the missed dose. Take the next dose at your regular time. Do not take double or extra doses. If you miss your dose for 2 weeks or more, take the next dose at your regular time or call your care team to talk about how to restart this medication. What may interact with this medication? Insulin and other medications for diabetes This list may not describe all possible interactions. Give your health care provider a list of all the medicines, herbs, non-prescription drugs, or dietary supplements you use. Also tell them if you smoke, drink alcohol, or use illegal drugs. Some items may interact with your medicine. What should I watch for while using this medication? Visit your care team for regular checks on your progress. It  may be some time before you see the benefit from this medication. Drink plenty of fluids while taking this medication. Check with your care team if you have severe diarrhea, nausea, and vomiting, or if you sweat a lot. The loss of too much body fluid may make it dangerous for you to take this medication. This medication may affect blood sugar levels. Ask your care team if changes in diet or  medications are needed if you have diabetes. If you or your family notice any changes in your behavior, such as new or worsening depression, thoughts of harming yourself, anxiety, other unusual or disturbing thoughts, or memory loss, call your care team right away. Women should inform their care team if they wish to become pregnant or think they might be pregnant. Losing weight while pregnant is not advised and may cause harm to the unborn child. Talk to your care team for more information. What side effects may I notice from receiving this medication? Side effects that you should report to your care team as soon as possible: Allergic reactions--skin rash, itching, hives, swelling of the face, lips, tongue, or throat Change in vision Dehydration--increased thirst, dry mouth, feeling faint or lightheaded, headache, dark yellow or brown urine Gallbladder problems--severe stomach pain, nausea, vomiting, fever Heart palpitations--rapid, pounding, or irregular heartbeat Kidney injury--decrease in the amount of urine, swelling of the ankles, hands, or feet Pancreatitis--severe stomach pain that spreads to your back or gets worse after eating or when touched, fever, nausea, vomiting Thoughts of suicide or self-harm, worsening mood, feelings of depression Thyroid cancer--new mass or lump in the neck, pain or trouble swallowing, trouble breathing, hoarseness Side effects that usually do not require medical attention (report to your care team if they continue or are bothersome): Diarrhea Loss of appetite Nausea Stomach pain Vomiting This list may not describe all possible side effects. Call your doctor for medical advice about side effects. You may report side effects to FDA at 1-800-FDA-1088. Where should I keep my medication? Keep out of the reach of children and pets. Refrigeration (preferred): Store in the refrigerator. Do not freeze. Keep this medication in the original container until you are  ready to take it. Get rid of any unused medication after the expiration date. Room temperature: If needed, prior to cap removal, the pen can be stored at room temperature for up to 28 days. Protect from light. If it is stored at room temperature, get rid of any unused medication after 28 days or after it expires, whichever is first. It is important to get rid of the medication as soon as you no longer need it or it is expired. You can do this in two ways: Take the medication to a medication take-back program. Check with your pharmacy or law enforcement to find a location. If you cannot return the medication, follow the directions in the [place]. NOTE: This sheet is a summary. It may not cover all possible information. If you have questions about this medicine, talk to your doctor, pharmacist, or health care provider.  2023 Elsevier/Gold Standard (2021-03-16 00:00:00)

## 2023-02-26 NOTE — Assessment & Plan Note (Signed)
Continue Phentermine 37.5 mg OD this time Will process for [facility] and see if insurance approves  Continue working on [employer] and exercise

## 2023-02-27 ENCOUNTER — Encounter: Payer: Self-pay | Admitting: Nurse Practitioner

## 2023-02-27 ENCOUNTER — Other Ambulatory Visit: Payer: Self-pay | Admitting: Nurse Practitioner

## 2023-02-27 DIAGNOSIS — D638 Anemia in other chronic diseases classified elsewhere: Secondary | ICD-10-CM

## 2023-02-27 LAB — IRON,TIBC AND FERRITIN PANEL
Ferritin: 376 ng/mL — ABNORMAL HIGH (ref 15–150)
Iron Saturation: 46 % (ref 15–55)
Iron: 112 ug/dL (ref 27–159)
Total Iron Binding Capacity: 241 ug/dL — ABNORMAL LOW (ref 250–450)
UIBC: 129 ug/dL — ABNORMAL LOW (ref 131–425)

## 2023-02-27 LAB — VITAMIN B12: Vitamin B-12: 1756 pg/mL — ABNORMAL HIGH (ref 232–1245)

## 2023-02-27 LAB — VITAMIN D 25 HYDROXY (VIT D DEFICIENCY, FRACTURES): Vit D, 25-Hydroxy: 54.2 ng/mL (ref 30.0–100.0)

## 2023-02-27 LAB — HEMOGLOBIN A1C
Est. average glucose Bld gHb Est-mCnc: 114 mg/dL
Hgb A1c MFr Bld: 5.6 % (ref 4.8–5.6)

## 2023-03-01 ENCOUNTER — Other Ambulatory Visit: Payer: Self-pay

## 2023-03-01 DIAGNOSIS — D638 Anemia in other chronic diseases classified elsewhere: Secondary | ICD-10-CM

## 2023-03-04 ENCOUNTER — Telehealth: Payer: Self-pay

## 2023-03-04 NOTE — Telephone Encounter (Signed)
Patient made aware of Wegovy being denied, due to insurance not covering medication.  Patient wanted to know if there is something else the provider recommends. Please Advise.

## 2023-03-04 NOTE — Telephone Encounter (Signed)
Patient is already on phentermine and on topamax.  She needs to work on [employer] and exercise.  Dr. Tobie Poet

## 2023-03-05 NOTE — Telephone Encounter (Signed)
Patient informed, patient states she has been on the two medications and nothing is working. She states she is dieting and exercising too as well. Please advise.

## 2023-03-06 NOTE — Telephone Encounter (Signed)
Mailbox if full- unable to leave message.

## 2023-03-11 ENCOUNTER — Inpatient Hospital Stay: Payer: Commercial Managed Care - PPO

## 2023-03-11 ENCOUNTER — Telehealth: Payer: Self-pay | Admitting: Oncology

## 2023-03-11 ENCOUNTER — Inpatient Hospital Stay: Payer: Commercial Managed Care - PPO | Attending: Oncology | Admitting: Oncology

## 2023-03-11 ENCOUNTER — Encounter: Payer: Self-pay | Admitting: Oncology

## 2023-03-11 VITALS — BP 141/94 | HR 78 | Temp 98.3°F | Resp 20 | Ht 66.0 in | Wt 213.8 lb

## 2023-03-11 DIAGNOSIS — D720 Genetic anomalies of leukocytes: Secondary | ICD-10-CM | POA: Insufficient documentation

## 2023-03-11 DIAGNOSIS — D72828 Other elevated white blood cell count: Secondary | ICD-10-CM | POA: Diagnosis not present

## 2023-03-11 DIAGNOSIS — Z720 Tobacco use: Secondary | ICD-10-CM

## 2023-03-11 DIAGNOSIS — R7989 Other specified abnormal findings of blood chemistry: Secondary | ICD-10-CM | POA: Diagnosis not present

## 2023-03-11 LAB — HEPATITIS PANEL, ACUTE
HCV Ab: NONREACTIVE
Hep A IgM: NONREACTIVE
Hep B C IgM: NONREACTIVE
Hepatitis B Surface Ag: NONREACTIVE

## 2023-03-11 LAB — CORTISOL: Cortisol, Plasma: 4.5 ug/dL

## 2023-03-11 LAB — SEDIMENTATION RATE: Sed Rate: 16 mm/hr (ref 0–22)

## 2023-03-11 LAB — HIV ANTIBODY (ROUTINE TESTING W REFLEX): HIV Screen 4th Generation wRfx: NONREACTIVE

## 2023-03-11 LAB — C-REACTIVE PROTEIN: CRP: 0.8 mg/dL (ref <1.0)

## 2023-03-11 LAB — CK: Total CK: 98 U/L (ref 38–234)

## 2023-03-11 NOTE — Progress Notes (Unsigned)
[facility] Initial Visit:  Patient Care Team: CoxElnita Maxwell, MD as PCP - General (Family Medicine)  CHIEF COMPLAINTS/PURPOSE OF CONSULTATION:    HISTORY OF PRESENTING ILLNESS: Nichole Gonzalez 47 y.o. female is here because of leukocytosis Medical history is notable for GERD, seasonal allergies, tobacco use, obesity  January 03, 2023: WBC 12.2 hemoglobin 14.5 platelet count 301; 70 segs 26 lymphs 4 monos ANA negative CCP negative rheumatoid factor 16.2 SSA/SSB negative Ferritin 376 B12 1756 Hemoglobin A1c 5.6 TSH 1.2 CMP normal  January 23, 2023: Sleep study likely positive for OSA  March 11 2023:  [facility]ematology Consult  Social:  Patient scheduler.  Tobacco smoked 1 ppd x 30 yrs; quit November 2023.  EtOH none    Review of Systems  Constitutional:  Negative for appetite change, chills, fatigue and fever.       Has gained 40 lbs in the last few months.  Does not attribute it to smoking cessation  HENT:   Negative for mouth sores, sore throat, trouble swallowing and voice change.        Some left sided nose bleeds owing to chronic rhinitis Has dry mouth and occasionally dry eyes  Eyes:  Negative for eye problems and icterus.       Vision changes:  None  Respiratory:  Negative for chest tightness, cough, hemoptysis, shortness of breath and wheezing.        PND:  none Orthopnea:  none DOE:    Cardiovascular:  Negative for chest pain, leg swelling and palpitations.       PND:  none Orthopnea:  none  Gastrointestinal:  Negative for abdominal pain, blood in stool, constipation, diarrhea, nausea and vomiting.  Endocrine: Negative for hot flashes.       Cold intolerance:  none Heat intolerance:  none  Genitourinary:  Negative for bladder incontinence, difficulty urinating, dysuria, frequency, hematuria and nocturia.   Musculoskeletal:  Negative for back pain, gait problem and neck stiffness.       Has chronic arthralgias of neck, knees,  shoulders, lower back Myalgias of shoulders  Skin:  Negative for rash and wound.       Had a period where she had pruritus on legs  Neurological:  Positive for headaches. Negative for extremity weakness, gait problem, light-headedness, seizures and speech difficulty.       Dizziness on standing.  Hands get numb and tingly  Hematological:  Negative for adenopathy. Does not bruise/bleed easily.  Psychiatric/Behavioral:  Negative for sleep disturbance and suicidal ideas. The patient is not nervous/anxious.     MEDICAL HISTORY: Past Medical History:  Diagnosis Date  . Abnormal thyroid stimulating hormone (TSH) level 02/17/2015  . Abnormal uterine bleeding 10/07/2020  . Allergic rhinitis 09/03/2015  . Depression, recurrent ([place]) 09/08/2020  . Gastroesophageal reflux disease 10/27/2020  . High risk sexual behavior 11/06/2020  . 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 Primary insomnia 09/08/2020  . Tobacco abuse 09/03/2015    SURGICAL HISTORY: Past Surgical History:  Procedure Laterality Date  . ABDOMINAL HYSTERECTOMY    . APPENDECTOMY    . CHOLECYSTECTOMY    . DILATION AND CURETTAGE, DIAGNOSTIC / THERAPEUTIC    . JOINT REPLACEMENT    . KNEE ARTHROSCOPY    . right foot    . TUBAL LIGATION      SOCIAL HISTORY: Social History   Socioeconomic History  . Marital status: Married    Spouse name: Not on file  . Number of children: 3  .  Years of education: Not on file  . Highest education level: Not on file  Occupational History  . Not on file  Tobacco Use  . Smoking status: Former    Packs/day: 1.00    Years: 30.00    Additional pack years: 0.00    Total pack years: 30.00    Types: Cigarettes  . Smokeless tobacco: Never  Substance and Sexual Activity  . Alcohol use: Yes    Alcohol/week: 8.0 standard drinks of alcohol    Types: 8 Standard drinks or equivalent per week    Comment: socially  . Drug use: Never  . Sexual activity: Not on file  Other Topics Concern  . Not on file   Social History Narrative   ** Merged History Encounter **       Social Determinants of Health   Financial Resource Strain: Low Risk  (09/27/2022)   Overall Financial Resource Strain (CARDIA)   . Difficulty of Paying Living Expenses: Not hard at all  Food Insecurity: No Food Insecurity (09/27/2022)   Hunger Vital Sign   . Worried About Charity fundraiser in the Last Year: Never true   . Ran Out of Food in the Last Year: Never true  Transportation Needs: No Transportation Needs (09/27/2022)   PRAPARE - Transportation   . Lack of Transportation (Medical): No   . Lack of Transportation (Non-Medical): No  Physical Activity: Inactive (09/27/2022)   Exercise Vital Sign   . Days of Exercise per Week: 0 days   . Minutes of Exercise per Session: 0 min  Stress: No Stress Concern Present (09/27/2022)   [place]   . Feeling of Stress : Only a little  Social Connections: Moderately Isolated (09/27/2022)   Social Connection and Isolation Panel [NHANES]   . Frequency of Communication with Friends and Family: More than three times a week   . Frequency of Social Gatherings with Friends and Family: More than three times a week   . Attends Religious Services: Never   . Active Member of Clubs or Organizations: No   . Attends Archivist Meetings: Never   . Marital Status: Married  Human resources officer Violence: Not At Risk (09/27/2022)   Humiliation, Afraid, Rape, and Kick questionnaire   . Fear of Current or Ex-Partner: No   . Emotionally Abused: No   . Physically Abused: No   . Sexually Abused: No    FAMILY HISTORY Family History  Problem Relation Age of Onset  . Thyroid disease Sister   . Coronary artery disease Father   . Heart attack Father   . COPD Sister   . Diabetes Mellitus II Sister     ALLERGIES:  is allergic to morphine and related, bee pollen, codeine, codeine, and rosuvastatin.  MEDICATIONS:  Current  Outpatient Medications  Medication Sig Dispense Refill  . aspirin-acetaminophen-caffeine (EXCEDRIN EXTRA STRENGTH) 250-250-65 MG per tablet Take 2 tablets by mouth every 6 (six) hours as needed for headache (and pain).    Marland Kitchen buPROPion (WELLBUTRIN XL) 150 MG 24 hr tablet Take 3 tablets (450 mg total) by mouth daily. 270 tablet 1  . cetirizine (ZYRTEC ALLERGY) 10 MG tablet Take 1 tablet (10 mg total) by mouth daily. 90 tablet 3  . citalopram (CELEXA) 20 MG tablet Take 1 tablet (20 mg total) by mouth daily. 90 tablet 1  . cyclobenzaprine (FLEXERIL) 10 MG tablet Take 1 tablet (10 mg total) by mouth at bedtime. 90 tablet 0  .  dicyclomine (BENTYL) 20 MG tablet Take 1 tablet (20 mg total) by mouth in the morning and at bedtime. 180 tablet 1  . EPIPEN 2-PAK 0.3 MG/0.3ML SOAJ injection   0  . fluticasone (FLONASE) 50 MCG/ACT nasal spray Place 1 spray into both nostrils daily. 16 g 0  . omeprazole (PRILOSEC) 40 MG capsule Take 1 capsule (40 mg total) by mouth daily. 90 capsule 3  . ondansetron (ZOFRAN-ODT) 4 MG disintegrating tablet Dissolve one tablet and place on top of the tongue, then swallow every 8 hours as needed for nausea/vomit. 20 tablet 0  . phentermine 37.5 MG capsule Take 1 capsule (37.5 mg total) by mouth every morning. 30 capsule 0  . sodium fluoride (PREVIDENT 5000 DRY MOUTH) 1.1 % GEL dental gel Apply a thin ribbon to toothbrush. Brush teeth thoroughly at bedtime for two minutes. Do not eat or drink for 30 min after use. 56 g 3  . SUMAtriptan (IMITREX) 50 MG tablet Take one tablet by mouth at onset of aura/migraine, may repeat in 2 hours once 9 tablet 2  . topiramate (TOPAMAX) 100 MG tablet Take 1 tablet (100 mg total) by mouth daily. 90 tablet 0  . traZODone (DESYREL) 100 MG tablet Take 1 tablet (100 mg total) by mouth at bedtime. 90 tablet 1   No current facility-administered medications for this visit.    PHYSICAL EXAMINATION:  ECOG PERFORMANCE STATUS: {CHL ONC ECOG  [id number]   Vitals:   03/11/23 0837 03/11/23 0839  BP: (!) 143/97 (!) 141/94  Pulse: 78   Resp: 20   Temp: 98.3 F (36.8 C)   SpO2: 100%     Filed Weights   03/11/23 0837  Weight: 213 lb 12.8 oz (97 kg)     Physical Exam Vitals and nursing note reviewed.  Constitutional:      Appearance: Normal appearance. She is not toxic-appearing or diaphoretic.     Comments: Here alone.    HENT:     Head: Normocephalic and atraumatic.     Right Ear: External ear normal.     Left Ear: External ear normal.     Nose: Nose normal. No congestion or rhinorrhea.  Eyes:     General: No scleral icterus.    Extraocular Movements: Extraocular movements intact.     Conjunctiva/sclera: Conjunctivae normal.     Pupils: Pupils are equal, round, and reactive to light.  Cardiovascular:     Rate and Rhythm: Normal rate.     Heart sounds: No murmur heard.    No friction rub. No gallop.  Abdominal:     General: Bowel sounds are normal.     Palpations: Abdomen is soft.  Musculoskeletal:        General: No swelling, tenderness or deformity.     Cervical back: Normal range of motion and neck supple. No rigidity or tenderness.  Lymphadenopathy:     Head:     Right side of head: No submental, submandibular, tonsillar, preauricular, posterior auricular or occipital adenopathy.     Left side of head: No submental, submandibular, tonsillar, preauricular, posterior auricular or occipital adenopathy.     Cervical: No cervical adenopathy.     Right cervical: No superficial, deep or posterior cervical adenopathy.    Left cervical: No superficial, deep or posterior cervical adenopathy.     Upper Body:     Right upper body: No supraclavicular, axillary, pectoral or epitrochlear adenopathy.     Left upper body: No supraclavicular, axillary, pectoral or epitrochlear adenopathy.  Skin:    General: Skin is warm.     Coloration: Skin is not jaundiced.  Neurological:     General: No focal deficit present.      Mental Status: She is alert and oriented to person, place, and time. Mental status is at baseline.     Cranial Nerves: No cranial nerve deficit.  Psychiatric:        Mood and Affect: Mood normal.        Behavior: Behavior normal.        Thought Content: Thought content normal.        Judgment: Judgment normal.    LABORATORY DATA: I have personally reviewed the data as listed:  Office Visit on 02/26/2023  Component Date Value Ref Range Status  . Total Iron Binding Capacity 02/26/2023 241 (L)  250 - 450 ug/dL Final  . UIBC 02/26/2023 129 (L)  131 - 425 ug/dL Final  . Iron 02/26/2023 112  27 - 159 ug/dL Final  . Iron Saturation 02/26/2023 46  15 - 55 % Final  . Ferritin 02/26/2023 376 (H)  15 - 150 ng/mL Final  . Vitamin B-12 02/26/2023 1,756 (H)  232 - 1,245 pg/mL Final  . Vit D, 25-Hydroxy 02/26/2023 54.2  30.0 - 100.0 ng/mL Final   Comment: Vitamin D deficiency has been defined by the Institute of Medicine and an Endocrine Society practice guideline as a level of serum 25-OH vitamin D less than 20 ng/mL (1,2). The Endocrine Society went on to further define vitamin D insufficiency as a level between 21 and 29 ng/mL (2). 1. IOM (Institute of Medicine). 2010. Dietary reference    intakes for calcium and D. [place]: The    [employer]. 2. Holick MF, Binkley [place], Bischoff-Ferrari HA, et al.    Evaluation, treatment, and prevention of vitamin D    deficiency: an Endocrine Society clinical practice    guideline. JCEM. 2011 Jul; 96(7):1911-30.   Marland Kitchen Hgb A1c MFr Bld 02/26/2023 5.6  4.8 - 5.6 % Final   Comment:          Prediabetes: 5.7 - 6.4          Diabetes: >6.4          Glycemic control for adults with diabetes: <7.0   . Est. average glucose Bld gHb Est-m* 02/26/2023 114  mg/dL Final    RADIOGRAPHIC STUDIES: I have personally reviewed the radiological images as listed and agree with the findings in the report  No results found.  ASSESSMENT/PLAN  Main  Causes of Neutrophilia Acute infections Bacterial Various pyogenic cocci, Escherichia coli, Pseudomonas aeruginosa, Corynebacterium diphtheriae, Francisella tularensis   Spirochaetal Syphilis, Leptospirosis   Rickettsial Typhus, [place] spotted fever   Chlamydial psittacosis   Viral Rabies, poliomyelitis, smallpox, herpes simplex infection, herpes zoster, chickenpox   Protozoal Pneumocystis carinii infection   Mycotic actinomycosis, coccidioidomycosis   Helmenthic liver fluke, filariasis  Acute Inflammation not caused by infection  Surgery, burns, infarcts, hepatic necrosis, crush injuries, RA, rheumatic fever, vasculitis, myositis, pancreatitis, hypersensitivity reactions.   Endocrine/ Metabolic  Cushing's syndrome, thyrotoxicosis, uremia, DKA, gout, DM Type II, Obesity Polycystic ovary syndrome  Acute Hemorrhage and Acute Hemolysis    Myeloproliferative neoplasms and myelodysplastic/myeloproliferative neoplasms    Malignant Diseases:   Carcinoma and solid tumors Lymphoma  Drugs  adrenaline, amphetamines, corticosteroids, lithium Cigarette smoking, vaping, marijuana   Hereditary  activating mutation in CSF3R  Miscellaneous  convulsions, paroxysmal tachycardia, electric shock, vomiting, after splenectomy, postneutropenic rebound neutrophilia,  Initial Evaluation:  Obtain CBC with diff, CMP, smear for morphology, CRP/ESR, PCR for bcr-abl, MPN hotspot panel.  Review medications.     Elevated ferritin:    Rheumatoid factor:   Rheumatoid Factor:  Antibodies directed against the Fc portion of immunoglobulins. Possess significant heterogeneity related to mutations within heavy and light chain genes.  Associated with Rheumatologic and Non-Rhematologic disorders. Rheumatic disorders -- Rheumatoid arthritis (RA), Sjgren's disease, Mixed connective tissue disease, Mixed cryoglobulinemia (types II and III), SLE, Polymyositis or dermatomyositis Nonrheumatic disorders -- Nonrheumatic  disorders characterized by chronic antigenic stimulation (especially with circulating immune complexes or polyclonal B lymphocyte activation) commonly induce RF production. Included in this group are Condition  Aging (>age 75)  Infection Bacterial endocarditis Hepatitis B or hepatitis C  Tuberculosis Syphilis  Parasitic diseases  Leprosy  Other viral infections  Pulmonary disease Sarcoidosis  Interstitial pulmonary fibrosis  Silicosis Asbestosis  Miscellaneous Diseases Primary biliary cholangitis  Malignancy After multiple immunizations Smoking   Best-documented examples of viral infection (in addition to hepatitis B and C) are rubella, mumps, influenza, and HIV. Chagas' disease, Leishmaniasis, onchocerciasis, and schistosomiasis are major parasitic diseases. B cell neoplasms are the most common malignancies.  Evaluation:  Obtain CBC with diff, CMP,  ESR, CRP, ANA panel, CCP, CK HIV, Hepatitis serologies, Quantiferon gold, ACE, RPR, SPEP with IEP, free light chains  Consider blood ctx to evaluate for endocarditis, serologies and stool specimens to evaluate for parasites, PCR for parvo b19   Cancer Staging  No matching staging information was found for the patient.   No problem-specific Assessment & Plan notes found for this encounter.    No orders of the defined types were placed in this encounter.     minutes was spent in patient care.  This included time spent preparing to see the patient (e.g., review of tests), obtaining and/or reviewing separately obtained history, counseling and educating the patient/family/caregiver, ordering medications, tests, or procedures; documenting clinical information in the electronic or other health record, independently interpreting results and communicating results to the patient/family/caregiver as well as coordination of care.       All questions were answered. The patient knows to call the clinic with any problems, questions or  concerns.  This note was electronically signed.    Barbee Cough, MD  03/11/2023 8:41 AM

## 2023-03-11 NOTE — Telephone Encounter (Signed)
Patient has been scheduled for follow-up visit per 03/11/23 LOS.  Pt given an appt calendar with date and time.

## 2023-03-12 LAB — KAPPA/LAMBDA LIGHT CHAINS
Kappa free light chain: 24 mg/L — ABNORMAL HIGH (ref 3.3–19.4)
Kappa, lambda light chain ratio: 1.09 (ref 0.26–1.65)
Lambda free light chains: 22.1 mg/L (ref 5.7–26.3)

## 2023-03-12 LAB — ANCA TITERS
Atypical P-ANCA titer: <1:20 {titer}
C-ANCA: <1:20 {titer}
P-ANCA: <1:20 {titer}

## 2023-03-15 ENCOUNTER — Other Ambulatory Visit: Payer: Self-pay

## 2023-03-15 LAB — MULTIPLE MYELOMA PANEL, SERUM
Albumin SerPl Elph-Mcnc: 3.7 g/dL (ref 2.9–4.4)
Albumin/Glob SerPl: 1.3 (ref 0.7–1.7)
Alpha 1: 0.2 g/dL (ref 0.0–0.4)
Alpha2 Glob SerPl Elph-Mcnc: 0.8 g/dL (ref 0.4–1.0)
B-Globulin SerPl Elph-Mcnc: 1 g/dL (ref 0.7–1.3)
Gamma Glob SerPl Elph-Mcnc: 1 g/dL (ref 0.4–1.8)
Globulin, Total: 3 g/dL (ref 2.2–3.9)
IgA: 182 mg/dL (ref 87–352)
IgG (Immunoglobin G), Serum: 966 mg/dL (ref 586–1602)
IgM (Immunoglobulin M), Srm: 120 mg/dL (ref 26–217)
Total Protein ELP: 6.7 g/dL (ref 6.0–8.5)

## 2023-03-18 LAB — BCR-ABL1 FISH
Cells Analyzed: 200
Cells Counted: 200

## 2023-03-19 ENCOUNTER — Other Ambulatory Visit: Payer: Self-pay | Admitting: Family Medicine

## 2023-03-19 ENCOUNTER — Other Ambulatory Visit: Payer: Self-pay

## 2023-03-19 DIAGNOSIS — F332 Major depressive disorder, recurrent severe without psychotic features: Secondary | ICD-10-CM

## 2023-03-19 DIAGNOSIS — K219 Gastro-esophageal reflux disease without esophagitis: Secondary | ICD-10-CM

## 2023-03-19 DIAGNOSIS — E6609 Other obesity due to excess calories: Secondary | ICD-10-CM

## 2023-03-19 DIAGNOSIS — F5101 Primary insomnia: Secondary | ICD-10-CM

## 2023-03-19 LAB — QUANTIFERON-TB GOLD PLUS (RQFGPL)
QuantiFERON Mitogen Value: >10 IU/mL
QuantiFERON Nil Value: 0.05 IU/mL
QuantiFERON TB1 Ag Value: 0.04 IU/mL
QuantiFERON TB2 Ag Value: 0.02 IU/mL

## 2023-03-19 LAB — QUANTIFERON-TB GOLD PLUS: QuantiFERON-TB Gold Plus: NEGATIVE

## 2023-03-19 MED ORDER — CITALOPRAM HYDROBROMIDE 20 MG PO TABS
20.0000 mg | ORAL_TABLET | Freq: Every day | ORAL | 0 refills | Status: DC
  Filled 2023-03-19: qty 90, 90d supply, fill #0

## 2023-03-19 MED ORDER — TRAZODONE HCL 100 MG PO TABS
100.0000 mg | ORAL_TABLET | Freq: Every day | ORAL | 0 refills | Status: DC
  Filled 2023-03-19: qty 90, 90d supply, fill #0

## 2023-03-19 MED ORDER — DICYCLOMINE HCL 20 MG PO TABS
20.0000 mg | ORAL_TABLET | Freq: Two times a day (BID) | ORAL | 0 refills | Status: DC
  Filled 2023-03-19: qty 180, 90d supply, fill #0

## 2023-03-19 MED ORDER — BUPROPION HCL ER (XL) 150 MG PO TB24
450.0000 mg | ORAL_TABLET | Freq: Every day | ORAL | 0 refills | Status: DC
  Filled 2023-03-19: qty 270, 90d supply, fill #0

## 2023-03-20 ENCOUNTER — Other Ambulatory Visit: Payer: Self-pay | Admitting: Family Medicine

## 2023-03-20 DIAGNOSIS — E6609 Other obesity due to excess calories: Secondary | ICD-10-CM

## 2023-03-20 LAB — JAK2 V617F RFX CALR/MPL/E12-15

## 2023-03-20 LAB — CALR +MPL + E12-E15  (REFLEX)

## 2023-03-20 MED ORDER — PHENTERMINE HCL 37.5 MG PO CAPS
37.5000 mg | ORAL_CAPSULE | Freq: Every morning | ORAL | 0 refills | Status: DC
  Filled 2023-03-20: qty 30, 30d supply, fill #0

## 2023-03-21 ENCOUNTER — Other Ambulatory Visit: Payer: Self-pay

## 2023-03-21 ENCOUNTER — Ambulatory Visit
Admission: RE | Admit: 2023-03-21 | Discharge: 2023-03-21 | Disposition: A | Discharge status: Home / Self Care | Payer: Commercial Managed Care - PPO | Source: Ambulatory Visit | Attending: Family Medicine | Admitting: Family Medicine

## 2023-03-21 DIAGNOSIS — Z1231 Encounter for screening mammogram for malignant neoplasm of breast: Secondary | ICD-10-CM | POA: Diagnosis not present

## 2023-03-21 LAB — HEMOCHROMATOSIS DNA-PCR(C282Y,H63D)

## 2023-03-25 ENCOUNTER — Other Ambulatory Visit: Payer: Self-pay

## 2023-03-25 ENCOUNTER — Inpatient Hospital Stay: Payer: Commercial Managed Care - PPO

## 2023-03-25 ENCOUNTER — Other Ambulatory Visit: Payer: Self-pay | Admitting: Family Medicine

## 2023-03-25 ENCOUNTER — Encounter: Payer: Self-pay | Admitting: Oncology

## 2023-03-25 ENCOUNTER — Encounter: Payer: Commercial Managed Care - PPO | Admitting: Internal Medicine

## 2023-03-25 ENCOUNTER — Inpatient Hospital Stay: Payer: Commercial Managed Care - PPO | Attending: Oncology | Admitting: Oncology

## 2023-03-25 VITALS — BP 128/87 | HR 85 | Temp 98.8°F | Resp 18 | Ht 66.0 in | Wt 211.6 lb

## 2023-03-25 DIAGNOSIS — R768 Other specified abnormal immunological findings in serum: Secondary | ICD-10-CM

## 2023-03-25 DIAGNOSIS — R7989 Other specified abnormal findings of blood chemistry: Secondary | ICD-10-CM

## 2023-03-25 DIAGNOSIS — D72828 Other elevated white blood cell count: Secondary | ICD-10-CM

## 2023-03-25 MED ORDER — FLUTICASONE PROPIONATE 50 MCG/ACT NA SUSP
1.0000 | Freq: Every day | NASAL | 0 refills | Status: DC
  Filled 2023-03-25: qty 16, 60d supply, fill #0

## 2023-03-25 NOTE — Progress Notes (Unsigned)
[facility] Follow up Visit:  Patient Care Team: Rochel Brome, MD as PCP - General (Family Medicine)  CHIEF COMPLAINTS/PURPOSE OF CONSULTATION:    HISTORY OF PRESENTING ILLNESS: Nichole Gonzalez 47 y.o. female is here because of leukocytosis Medical history is notable for GERD, seasonal allergies, tobacco use, obesity  January 03, 2023: WBC 12.2 hemoglobin 14.5 platelet count 301; 70 segs 26 lymphs 4 monos ANA negative CCP negative rheumatoid factor 16.2 SSA/SSB negative Ferritin 376 B12 1756 Hemoglobin A1c 5.6 TSH 1.2 CMP normal  January 23, 2023: Sleep study likely positive for OSA  March 11 2023:  [facility] Hematology Consult  Social:  Patient scheduler.  Tobacco smoked 1 ppd x 30 yrs; quit November 2023.  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  Serum free kappa 24 lambda 22.1 with a kappa lambda 1.09 IgG 966 IgA a 182 IgM 120 ANCA titers negative CRP 0.8 sed rate 16 cortisol 4.5 CK 98 QuantiFERON TB Gold negative.  HIV negative  March 25 2023:  Scheduled follow up for granulocytosis.  Reviewed results of labs with patient.   Patient states that she was diagnosed with OSA but can not afford the CPAP machine.   Follow up 6 months   Review of Systems  Constitutional:  Negative for appetite change, chills, fatigue and fever.       Has gained 40 lbs in the last few months.  Does not attribute it to smoking cessation  HENT:   Negative for mouth sores, sore throat, trouble swallowing and voice change.        Some left sided nose bleeds owing to chronic rhinitis Has dry mouth and occasionally dry eyes  Eyes:  Negative for eye problems and icterus.       Vision changes:  None  Respiratory:  Negative for chest tightness, cough, hemoptysis, shortness of breath and wheezing.        PND:  none Orthopnea:   none DOE:    Cardiovascular:  Negative for chest pain, leg swelling and palpitations.       PND:  none Orthopnea:  none  Gastrointestinal:  Negative for abdominal pain, blood in stool, constipation, diarrhea, nausea and vomiting.  Endocrine: Negative for hot flashes.       Cold intolerance:  none Heat intolerance:  none  Genitourinary:  Negative for bladder incontinence, difficulty urinating, dysuria, frequency, hematuria and nocturia.   Musculoskeletal:  Negative for back pain, gait problem and neck stiffness.       Has chronic arthralgias of neck, knees, shoulders, lower back Myalgias of shoulders  Skin:  Negative for rash and wound.       Had a period where she had pruritus on legs  Neurological:  Positive for headaches. Negative for extremity weakness, gait problem, light-headedness, seizures and speech difficulty.       Dizziness on standing.  Hands get numb and tingly  Hematological:  Negative for adenopathy. Does not bruise/bleed easily.  Psychiatric/Behavioral:  Negative for sleep disturbance and suicidal ideas. The patient is not nervous/anxious.     MEDICAL HISTORY: Past Medical History:  Diagnosis Date   Abnormal thyroid stimulating hormone (TSH) level 02/17/2015   Abnormal uterine bleeding 10/07/2020   Allergic rhinitis 09/03/2015   Depression, recurrent ([place]) 09/08/2020   Gastroesophageal reflux disease 10/27/2020   High risk sexual behavior 11/06/2020   Hymenoptera allergy  09/03/2015   Primary insomnia 09/08/2020   Tobacco abuse 09/03/2015    SURGICAL HISTORY: Past Surgical History:  Procedure Laterality Date   ABDOMINAL HYSTERECTOMY     APPENDECTOMY     CHOLECYSTECTOMY     DILATION AND CURETTAGE, DIAGNOSTIC / THERAPEUTIC     JOINT REPLACEMENT     KNEE ARTHROSCOPY     right foot     TUBAL LIGATION      SOCIAL HISTORY: Social History   Socioeconomic History   Marital status: Married    Spouse name: Not on file   Number of children: 3   Years of education:  Not on file   Highest education level: Not on file  Occupational History   Not on file  Tobacco Use   Smoking status: Former    Packs/day: 1.00    Years: 30.00    Additional pack years: 0.00    Total pack years: 30.00    Types: Cigarettes   Smokeless tobacco: Never  Substance and Sexual Activity   Alcohol use: Yes    Alcohol/week: 8.0 standard drinks of alcohol    Types: 8 Standard drinks or equivalent per week    Comment: socially   Drug use: Never   Sexual activity: Not on file  Other Topics Concern   Not on file  Social History Narrative   ** Merged History Encounter **       Social Determinants of Health   Financial Resource Strain: Low Risk  (09/27/2022)   Overall Financial Resource Strain (CARDIA)    Difficulty of Paying Living Expenses: Not hard at all  Food Insecurity: No Food Insecurity (09/27/2022)   Hunger Vital Sign    Worried About Running Out of Food in the Last Year: Never true    Ran Out of Food in the Last Year: Never true  Transportation Needs: No Transportation Needs (09/27/2022)   PRAPARE - Hydrologist (Medical): No    Lack of Transportation (Non-Medical): No  Physical Activity: Inactive (09/27/2022)   Exercise Vital Sign    Days of Exercise per Week: 0 days    Minutes of Exercise per Session: 0 min  Stress: No Stress Concern Present (09/27/2022)   [place]    Feeling of Stress : Only a little  Social Connections: Moderately Isolated (09/27/2022)   Social Connection and Isolation Panel [NHANES]    Frequency of Communication with Friends and Family: More than three times a week    Frequency of Social Gatherings with Friends and Family: More than three times a week    Attends Religious Services: Never    Marine scientist or Organizations: No    Attends Archivist Meetings: Never    Marital Status: Married  Human resources officer Violence: Not At  Risk (09/27/2022)   Humiliation, Afraid, Rape, and Kick questionnaire    Fear of Current or Ex-Partner: No    Emotionally Abused: No    Physically Abused: No    Sexually Abused: No    FAMILY HISTORY Family History  Problem Relation Age of Onset   Thyroid disease Sister    Coronary artery disease Father    Heart attack Father    COPD Sister    Diabetes Mellitus II Sister     ALLERGIES:  is allergic to morphine and related, bee pollen, codeine, codeine, and rosuvastatin.  MEDICATIONS:  Current Outpatient Medications  Medication Sig Dispense Refill   aspirin-acetaminophen-caffeine (EXCEDRIN  EXTRA STRENGTH) 250-250-65 MG per tablet Take 2 tablets by mouth every 6 (six) hours as needed for headache (and pain).     buPROPion (WELLBUTRIN XL) 150 MG 24 hr tablet Take 3 tablets (450 mg total) by mouth daily. 270 tablet 0   cetirizine (ZYRTEC ALLERGY) 10 MG tablet Take 1 tablet (10 mg total) by mouth daily. 90 tablet 3   citalopram (CELEXA) 20 MG tablet Take 1 tablet (20 mg total) by mouth daily. 90 tablet 0   cyclobenzaprine (FLEXERIL) 10 MG tablet Take 1 tablet (10 mg total) by mouth at bedtime. 90 tablet 0   dicyclomine (BENTYL) 20 MG tablet Take 1 tablet (20 mg total) by mouth in the morning and at bedtime. 180 tablet 0   EPIPEN 2-PAK 0.3 MG/0.3ML SOAJ injection   0   fluticasone (FLONASE) 50 MCG/ACT nasal spray Place 1 spray into both nostrils daily. 16 g 0   omeprazole (PRILOSEC) 40 MG capsule Take 1 capsule (40 mg total) by mouth daily. 90 capsule 3   ondansetron (ZOFRAN-ODT) 4 MG disintegrating tablet Dissolve one tablet and place on top of the tongue, then swallow every 8 hours as needed for nausea/vomit. 20 tablet 0   phentermine 37.5 MG capsule Take 1 capsule (37.5 mg total) by mouth every morning. 30 capsule 0   sodium fluoride (PREVIDENT 5000 DRY MOUTH) 1.1 % GEL dental gel Apply a thin ribbon to toothbrush. Brush teeth thoroughly at bedtime for two minutes. Do not eat or drink  for 30 min after use. 56 g 3   SUMAtriptan (IMITREX) 50 MG tablet Take one tablet by mouth at onset of aura/migraine, may repeat in 2 hours once 9 tablet 2   topiramate (TOPAMAX) 100 MG tablet Take 1 tablet (100 mg total) by mouth daily. 90 tablet 0   traZODone (DESYREL) 100 MG tablet Take 1 tablet (100 mg total) by mouth at bedtime. 90 tablet 0   No current facility-administered medications for this visit.    PHYSICAL EXAMINATION:  ECOG PERFORMANCE STATUS: 0 - Asymptomatic   There were no vitals filed for this visit.   There were no vitals filed for this visit.    Physical Exam Vitals and nursing note reviewed.  Constitutional:      Appearance: Normal appearance. She is not toxic-appearing or diaphoretic.     Comments: Here alone.    HENT:     Head: Normocephalic and atraumatic.     Right Ear: External ear normal.     Left Ear: External ear normal.     Nose: Nose normal. No congestion or rhinorrhea.  Eyes:     General: No scleral icterus.    Extraocular Movements: Extraocular movements intact.     Conjunctiva/sclera: Conjunctivae normal.     Pupils: Pupils are equal, round, and reactive to light.  Cardiovascular:     Rate and Rhythm: Normal rate.     Heart sounds: No murmur heard.    No friction rub. No gallop.  Abdominal:     General: Bowel sounds are normal.     Palpations: Abdomen is soft.  Musculoskeletal:        General: No swelling, tenderness or deformity.     Cervical back: Normal range of motion and neck supple. No rigidity or tenderness.  Lymphadenopathy:     Head:     Right side of head: No submental, submandibular, tonsillar, preauricular, posterior auricular or occipital adenopathy.     Left side of head: No submental, submandibular, tonsillar, preauricular,  posterior auricular or occipital adenopathy.     Cervical: No cervical adenopathy.     Right cervical: No superficial, deep or posterior cervical adenopathy.    Left cervical: No superficial, deep  or posterior cervical adenopathy.     Upper Body:     Right upper body: No supraclavicular, axillary, pectoral or epitrochlear adenopathy.     Left upper body: No supraclavicular, axillary, pectoral or epitrochlear adenopathy.  Skin:    General: Skin is warm.     Coloration: Skin is not jaundiced.  Neurological:     General: No focal deficit present.     Mental Status: She is alert and oriented to person, place, and time. Mental status is at baseline.     Cranial Nerves: No cranial nerve deficit.  Psychiatric:        Mood and Affect: Mood normal.        Behavior: Behavior normal.        Thought Content: Thought content normal.        Judgment: Judgment normal.     LABORATORY DATA: I have personally reviewed the data as listed:  Clinical Support on 03/11/2023  Component Date Value Ref Range Status   Total CK 03/11/2023 98  38 - 234 U/L Final   Performed at [facility], [place] [address], [place], [place] [address]   C-ANCA 03/11/2023 <1:20  Neg:<1:20 titer Final   P-ANCA 03/11/2023 <1:20  Neg:<1:20 titer Final   Comment: (NOTE) The presence of positive fluorescence exhibiting P-ANCA or C-ANCA patterns alone is not specific for the diagnosis of Wegener's Granulomatosis (WG) or microscopic polyangiitis. Decisions about treatment should not be based solely on ANCA IFA results.  The International ANCA Group Consensus recommends follow up testing of positive sera with both PR-3 and MPO-ANCA enzyme immunoassays. As many as 5% serum samples are positive only by EIA. Ref. AM J Clin Pathol 1999;111:507-513.    Atypical P-ANCA titer 03/11/2023 <1:20  Neg:<1:20 titer Final   Comment: (NOTE) The atypical pANCA pattern has been observed in a significant percentage of patients with ulcerative colitis, primary sclerosing cholangitis and autoimmune hepatitis. Performed At: [facility] [place], [place] [id number] Rush Farmer MD [id number]     Kappa free light chain 03/11/2023 24.0 (H)  3.3 - 19.4 mg/L Final   Lambda free light chains 03/11/2023 22.1  5.7 - 26.3 mg/L Final   Kappa, lambda light chain ratio 03/11/2023 1.09  0.26 - 1.65 Final   Comment: (NOTE) Performed At: [facility] [place], [place] [id number] Rush Farmer MD [id number]    IgG (Immunoglobin G), Serum 03/11/2023 966  586 - 1,602 mg/dL Final   IgA 03/11/2023 182  87 - 352 mg/dL Final   IgM (Immunoglobulin M), Srm 03/11/2023 120  26 - 217 mg/dL Final   Total Protein ELP 03/11/2023 6.7  6.0 - 8.5 g/dL Corrected   Albumin SerPl Elph-Mcnc 03/11/2023 3.7  2.9 - 4.4 g/dL Corrected   Alpha 1 03/11/2023 0.2  0.0 - 0.4 g/dL Corrected   Alpha2 Glob SerPl Elph-Mcnc 03/11/2023 0.8  0.4 - 1.0 g/dL Corrected   B-Globulin SerPl Elph-Mcnc 03/11/2023 1.0  0.7 - 1.3 g/dL Corrected   Gamma Glob SerPl Elph-Mcnc 03/11/2023 1.0  0.4 - 1.8 g/dL Corrected   M Protein SerPl Elph-Mcnc 03/11/2023 Not Observed  Not Observed g/dL Corrected   Globulin, Total 03/11/2023 3.0  2.2 - 3.9 g/dL Corrected   Albumin/Glob SerPl 03/11/2023 1.3  0.7 - 1.7 Corrected  IFE 1 03/11/2023 Comment   Corrected   Comment: (NOTE) The immunofixation pattern appears unremarkable. Evidence of monoclonal protein is not apparent.    Please Note 03/11/2023 Comment   Corrected   Comment: (NOTE) Protein electrophoresis scan will follow via computer, mail, or courier delivery. Performed At: [facility] [address] [place], [place] [id number] Rush Farmer MD [id number]    QuantiFERON Incubation 03/11/2023 Incubation performed.   Final   QuantiFERON-TB Gold Plus 03/11/2023 Negative  Negative Final   Comment: (NOTE) No response to M tuberculosis antigens detected. Infection with M tuberculosis is unlikely, but high risk individuals should be considered for additional testing (ATS/IDSA/CDC Clinical Practice Guidelines, 2017). The reference range is an Antigen minus  Nil result of <0.35 IU/mL. Chemiluminescence immunoassay methodology Performed At: [facility] [place], [place] [id number] Rush Farmer MD [id number]    Hepatitis B Surface Ag 03/11/2023 NON REACTIVE  NON REACTIVE Final   HCV Ab 03/11/2023 NON REACTIVE  NON REACTIVE Final   Comment: (NOTE) Nonreactive HCV antibody screen is consistent with no HCV infections,  unless recent infection is suspected or other evidence exists to indicate HCV infection.     Hep A IgM 03/11/2023 NON REACTIVE  NON REACTIVE Final   Hep B C IgM 03/11/2023 NON REACTIVE  NON REACTIVE Final   Performed at [facility], [place] [address], [place], [place] [address]   Cortisol, Plasma 03/11/2023 4.5  ug/dL Final   Comment: (NOTE) AM    6.7 - 22.6 ug/dL PM   <10.0       ug/dL Performed at [facility] [address] [place], [place] [address]    Sed Rate 03/11/2023 16  0 - 22 mm/hr Final   Performed at [facility], [place] [address] [place], [place] [address]   CRP 03/11/2023 0.8  <1.0 mg/dL Final   Performed at [place] [facility] [address], [place], [place] [address]   DNA Mutation Analysis 03/11/2023 Comment   Final   Comment: (NOTE) Result: c.845G>A (p.Cys282Tyr) - Detected, heterozygous c.187C>G (p.His63Asp) - Not Detected c.193A>T (p.Ser65Cys) - Not Detected Not associated with increased risk to develop clinical symptoms of Hereditary Hemochromatosis. In symptomatic individuals, other causes of iron overload should be evaluated. See Additional Information and Comments. Additional Clinical Information: Hereditary hemochromatosis (HFE related) is an autosomal recessive iron storage disorder. Patients may have a genetic diagnosis of hereditary hemochromatosis and never show clinical symptoms. Clinical symptoms typically appear between 42 to 64 years in males and after menopause in females. Signs and symptoms may include organ damage,  primarily in the liver, risk for hepatocellular carcinoma, diabetes, and heart disease due to iron accumulation. Life expectancy may be decreased in individuals who develop cirrhosis. Treatment for clinically symptomatic individuals may include therapeutic phl                          ebotomy. Liver transplant may be used to treat end stage liver failure. For preventive care, monitoring for iron overload is recommended for patients who are homozygous for c.845G>A (p.Cys282Tyr) and have yet to experience clinical symptoms. Comments: The most common HFE variants associated with hereditary hemochromatosis are c.845G>A (p.Cys282Tyr), c.187C>G (p.His63Asp), c.193A>T (p.Ser65Cys). While patients homozygous for c.845G>A (p.Cys282Tyr) are the most likely to present clinical symptoms, less than 10% develop clinically significant iron overload with tissue and organ damage. Genetic counseling is recommended to discuss the potential clinical implications of  positive results, as well as recommendations for testing family members. Genetic Coordinators are available for health care providers to discuss results at [phone]). Test Details: Three variants analyzed: c.845G>A (p.Cys282Tyr), commonly referred to as C282Y c.187C>G (p.His63Asp), commonly ref                          erred to as H63D c.193A>T (p.Ser65Cys), commonly referred to as S65C Methods/Limitations: DNA Analysis of the HFE gene (NM_[id number]) was performed by PCR amplification followed by restriction enzyme digestion analyses. Results must be combined with clinical information for the most accurate interpretation. Molecular-based testing is highly accurate, but as in any laboratory test, diagnostic errors may occur. False positive or false negative results may occur for reasons that include genetic variants, blood transfusions, bone marrow transplantation, somatic or tissue-specific mosaicism, mislabeled samples, or  erroneous representation of family relationships. This test was developed and its performance characteristics determined by [facility]. It has not been cleared or approved by the Food and Drug Administration. References: Ula Lingo, [address] Kowdley Milagros Reap LW, [place] AS; American Association for the Study of Liver Diseases. Diagnosis and management of hemochromato                          sis: 2011 practice guideline by the American Association for the Study of Liver Diseases. Hepatology. 2011 Jul;54(1):328-43. doi: 10.1002/hep.24330. PMID: [id number]; PMCIDWX:7704558. [address] Brissot P, Swinkels DW, [employer], Kamarainen O, Patton S, Alonso I, Morris M, [place] best practice guidelines for the molecular genetic diagnosis of hereditary hemochromatosis [facility]). Eur J Hum Genet. 2016 Apr;24(4):479-95. doi: 10.1038/ejhg.2015.128. Epub 2015 Jul 8. PMID: [id number]; PMCIDJV:9512410.    Reviewed by: 03/11/2023 Comment   Final   Comment: (NOTE) Technical Component performed at [facility] Professional Component performed by: Melissa A. Martie Round, Ph.D., [facility] Director, Molecular Genetics [place] [address] Performed At: [facility] [address] [place], [place] [id number] Katina Degree MDPhD [id number]    JAK2 V617F Result 03/11/2023 Comment   Final   Comment: (NOTE) NEGATIVE The JAK2 V617F mutation is not detected in the provided specimen of this individual. Results should be interpreted in conjunction with clinical and other laboratory findings for the most accurate interpretation. This test was developed and its performance characteristics determined by [facility]. It has not been cleared or approved by the Food and Drug Administration.    Reflex 03/11/2023 Comment   Final   Comment: (NOTE) Reflex to CALR Mutation Analysis, JAK2 Exon 12-15 Mutation Analysis, and MPL Mutation Analysis is indicated.    V617F Rfx CALR/MPL/E12-15 Bkgd 03/11/2023 Comment    Final   Comment: (NOTE) Molecular testing of blood or bone marrow is useful in the evaluation of suspected myeloproliferative neoplasms (MPN). Mutations in the JAK2, MPL, and CALR genes are present in virtually all MPNs and their presence help distinguish benign reactive processes from clonal neoplasms. These mutations are generally considered mutually exclusive, although concurrent clones have been reported in rare patients. This test will assess for the JAK2V617F (exon 14) mutation first and will reflex to CALR mutation analysis, MPL mutation analysis, and JAK2 exon 12 to 15 mutation analysis if the JAK2V617F mutation is negative. The JAK2 (Janus kinase 2) gene encodes for a non-receptor protein tyrosine kinase that activates cytokine and growth factor signaling. The V617F (c.1849 G>T) mutation results in constitutive activation of JAK2 and downstream STAT5 and ERK signaling. The  V617F mutation is observed in approximately 95% of polycythemia vera (PV), 60% of essential thrombocythemi                          a (ET) and primary myelofibrosis (PMF). It is also infrequently present (3-5%) in myelodysplastic syndrome, chronic myelomonocytic leukemia, and other atypical chronic myeloid disorders. A small percentage of JAK2 mutation positive patients (3.3%) contain other non-V617F mutations within exons 12 to 15. In particular, mutations in exon 12 of JAK2 have been described in approximately 3% of patients with PV. JAK2 allele burden correlates with clinical phenotype, with low levels of mutant allele characterized by thrombocytosis, intermediate levels with erythrocytosis, and high mutant allele burden correlating with enhanced myelopoiesis of the BM, leukocytosis, increasing spleen size, and circulating CD34-positive cells. The CALR (Calreticulin) gene encodes for a multifunctional calcium-binding protein involved in many cellular activities such as growth, proliferation, adhesion,  and programmed cell death. Among patients with JAK2 negative MPNs, CALR are found in approximat                          ely 70% of patients with JAK2-negative essential thrombocythemia (ET) and 60-88% of patients with JAK2-negative primary myelofibrosis(PMF). Only a minority of patients (approximately 8%) with myelodysplasia have mutations in the CALR gene. CALR mutations are rarely detected in patients with de novo acute myeloid leukemia, chronic myelogenous leukemia, lymphoid leukemia, or solid tumors. CALR mutations are not detected in polycythemia and generally appear to be mutually exclusive with JAK2 mutations and MPL mutations. The majority of mutational changes involve a variety of insertion deletion mutations in exon 9 of the calreticulin gene: approximately 53% of all CALR mutations are a 52 bp deletion (type-1) while the second most prevalent mutation (approximately 32%) contains a 5 bp insertion (type-2). Other mutations (non-type 1 or type 2) are seen in a small minority of cases. CALR mutations in PMF tend to be with a favorable prognosis compared to JAK2 V617F mutations, w                          hereas primary myelofibrosis negative for CALR, JAK2 V617F and MPL mutations (so-called triple negative) is associated with a poor prognosis and shorter survival. The MPL (myeloproliferative leukemia virus oncogene) gene encodes the thrombopoietin receptor which regulates hematopoiesis and megakaryopoiesis. Activating MPL mutations are associated with a subset of myeloproliferative neoplasms and acute megakaryoblastic leukemia. MPL W515 mutations are present in approximately 5-8% of patients with primary myelofibrosis (PMF) and 1-4% of patients with essential thrombocythemia (ET). The S505 mutation is detected in patients with hereditary thrombocythemia. Limitations This assay has a sensitivity of approximately 1% VAF for JAK2 V617F, 2.5% VAF for other mutations in JAK2  exons 12 to 15, CALR mutations, and MPL mutations.    Method 03/11/2023 based next generation sequencing.   Final   Comment: Comment [employer]    References [id number] Comment   Final   Comment: (NOTE) Alghasham N, Alnouri Y, Abalkhail H, Khalil S. Detection of mutations in JAK2 exons 12-15 by [employer] sequencing. Int J Lab Hematol. 2016 Feb;38(1):34-41. doi: 10.1111/ijlh.UZ:7242789. Epub 2015 Sep 11. PMID: [id number]. Octavia Heir, Oneida Arenas, Hasserjian R, Linward Foster, Borowitz MJ, Zada Finders MM, [place] CD, [place], V[place] JW. The 2016 revision to the World Health Organization classification of myeloid neoplasms and acute leukemia. Blood. 2016 May 19;127(20):2391-405. doi: 10.1182/blood-2016-03-643544. Epub 2016 Apr 11. PMID: [id number].  Blima Ledger [facility], Zhang ZJ, [place] S, Albitar M. Mutation profile of JAK2 transcripts in patients with chronic myeloproliferative neoplasias. J Mol Diagn. 2009 Jan;11(1):49-53.doi: 10.2353/jmoldx.[id number]. Epub 2008 Dec 12. PMID: [id number]; PMCIDXZ:7723798. NCCN Clinical Practice Guidelines in Oncology (NCCN Guidelines) Myeloproliferative Neoplasms Version 3.2022 - August 03, 2021. Swerdlow SH, English as a second language teacher. WHO classif                          ication of Tumours of Haematopoietic and Lymphoid Tissues. 4th edn. Nada Maclachlan, [place]: Air cabin crew for [employer] on [employer]; 2017. Tefferi A. Primary myelofibrosis: 2021 update on diagnosis, risk-stratification and management. Am J Hematol. 2021 Jan;96(1):145-162. doi: 10.1002/ajh.26050. Epub 2020 Dec 2. PMID: [id number]. Grier Mitts, Kralovics R. Genetic basis and molecular pathophysiology of classical myeloproliferative neoplasms. Blood. 2017 Feb 9;129(6):667-679. doi: 10.1182/blood-2016-10-695940. Epub 2016 Dec 27. PMID: [id number].    Director Review 03/11/2023 Comment   Final   Comment: (NOTE) Loni Muse, PhD, [facility]    Director, [place], [place] [address]    [phone] Performed At: [place], [place] [id number] Katina Degree MDPhD [id number] Performed At: [facility] [address] [place], [place] [id number] Katina Degree MDPhD [id number]    QuantiFERON Criteria 03/11/2023 Comment   Final   Comment: (NOTE) QuantiFERON-TB Gold Plus is a qualitative indirect test for M tuberculosis infection (including disease) and is intended for use in conjunction with risk assessment, radiography, and other medical and diagnostic evaluations. The QuantiFERON-TB Gold Plus result is determined by subtracting the Nil value from either TB antigen (Ag) value. The Mitogen tube serves as a control for the test.    QuantiFERON TB1 Ag Value 03/11/2023 0.04  IU/mL Final   QuantiFERON TB2 Ag Value 03/11/2023 0.02  IU/mL Final   QuantiFERON Nil Value 03/11/2023 0.05  IU/mL Final   QuantiFERON Mitogen Value 03/11/2023 >10.00  IU/mL Final   Comment: (NOTE) Performed At: [facility] [place], [place] [id number] Rush Farmer MD [id number]    CALR Result 03/11/2023 Comment   Final   Comment: (NOTE) NEGATIVE No insertions or deletions were detected within the analyzed region of the calreticulin (CALR) gene. A negative result does not entirely exclude the possibility of a clonal population carrying CALR gene mutations that are not covered by this assay. Results should be interpreted in conjunction with clinical and laboratory findings for the most accurate interpretation.    MPL Result 03/11/2023 Comment   Final   Comment: (NOTE) NEGATIVE No MPL mutation was identified in the provided specimen of this individual. Results should be interpreted in conjunction with clinical and other laboratory findings for the most accurate interpretation.    E12-15 Result 03/11/2023 Comment   Final   Comment: (NOTE) NEGATIVE JAK2 mutations were not  detected in exons 12, 13, 14 and 15. This result does not rule out the presence of JAK2 mutation at a level below the detection sensitivity of this assay, the presence of other mutations outside the analyzed region of the JAK2 gene, or the presence of a myeloproliferative or other neoplasm. Result must be correlated with other clinical data for the most accurate diagnosis. Performed At: [facility] [place], [place] [id number] Katina Degree MDPhD [id number]   Office Visit on 03/11/2023  Component Date Value Ref Range Status  HIV Screen 4th Generation wRfx 03/11/2023 Non Reactive  Non Reactive Final   Performed at [facility], [place] [address], [place], [place] [address]   Specimen Type 03/11/2023 BLOOD   Final   Cells Counted 03/11/2023 200   Final   Cells Analyzed 03/11/2023 200   Final   FISH Result 03/11/2023 Comment:   Final   NORMAL:  NO BCR OR ABL1 GENE REARRANGEMENT OBSERVED   Interpretation 03/11/2023 Comment:   Final   Comment: (NOTE)             nuc ish 9q34(ASS1,ABL1)x2,22q11.2(BCRx2)[200].      The fluorescence in situ hybridization (FISH) study was normal. FISH, using unique sequence DNA probes for the ABL1 and BCR gene regions showed two ABL1 signals (red), two control ASS1 gene signals (aqua) located adjacent to the ABL1 locus at 9q34, and two BCR signals (green) at 22q11.2 in all interphase nuclei examined. There was NO evidence of CML or ALL-associated BCR::ABL1 dual fusion signals in this analysis. .      This analysis is limited to abnormalities detectable by the specific probes included in the study. FISH results should be interpreted within the context of a full cytogenetic analysis and pathology evaluation.  A BCR::ABL1 gene fusion in greater than 3 interphase nuclei in a patient with a new clinical diagnosis is considered positive. The DNA probe vendor for this study was [employer], research (physical sciences)). .      This test was  developed and its performance characteristics determined                           by [employer]). It has not been cleared or approved by the U.S. Food and Drug Administration.    Director Review: 03/11/2023 Comment:   Final   Comment: (NOTE) Maylene Roes, PHD, [place] Performed At: [facility] [address] [place], [place] [id number] Katina Degree MDPhD [id number]   Office Visit on 02/26/2023  Component Date Value Ref Range Status   Total Iron Binding Capacity 02/26/2023 241 (L)  250 - 450 ug/dL Final   UIBC 02/26/2023 129 (L)  131 - 425 ug/dL Final   Iron 02/26/2023 112  27 - 159 ug/dL Final   Iron Saturation 02/26/2023 46  15 - 55 % Final   Ferritin 02/26/2023 376 (H)  15 - 150 ng/mL Final   Vitamin B-12 02/26/2023 1,756 (H)  232 - 1,245 pg/mL Final   Vit D, 25-Hydroxy 02/26/2023 54.2  30.0 - 100.0 ng/mL Final   Comment: Vitamin D deficiency has been defined by the Institute of Medicine and an Endocrine Society practice guideline as a level of serum 25-OH vitamin D less than 20 ng/mL (1,2). The Endocrine Society went on to further define vitamin D insufficiency as a level between 21 and 29 ng/mL (2). 1. IOM (Institute of Medicine). 2010. Dietary reference    intakes for calcium and D. [place]: The    [employer]. 2. Holick MF, Binkley Taylor, Bischoff-Ferrari HA, et al.    Evaluation, treatment, and prevention of vitamin D    deficiency: an Endocrine Society clinical practice    guideline. JCEM. 2011 Jul; 96(7):1911-30.    Hgb A1c MFr Bld 02/26/2023 5.6  4.8 - 5.6 % Final   Comment:          Prediabetes: 5.7 - 6.4          Diabetes: >  6.4          Glycemic control for adults with diabetes: <7.0    Est. average glucose Bld gHb Est-m* 02/26/2023 114  mg/dL Final    RADIOGRAPHIC STUDIES: I have personally reviewed the radiological images as listed and agree with the findings in the report  MM 3D SCREEN  BREAST BILATERAL  Result Date: 03/22/2023 CLINICAL DATA:  Screening. EXAM: DIGITAL SCREENING BILATERAL MAMMOGRAM WITH TOMOSYNTHESIS AND CAD TECHNIQUE: Bilateral screening digital craniocaudal and mediolateral oblique mammograms were obtained. Bilateral screening digital breast tomosynthesis was performed. The images were evaluated with computer-aided detection. COMPARISON:  None available. ACR Breast Density Category b: There are scattered areas of fibroglandular density. FINDINGS: There are no findings suspicious for malignancy. IMPRESSION: No mammographic evidence of malignancy. A result letter of this screening mammogram will be mailed directly to the patient. RECOMMENDATION: Screening mammogram in one year. (Code:SM-B-01Y) BI-RADS CATEGORY  1: Negative. Electronically Signed   By: Zerita Boers M.D.   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      Elevated ferritin:    Causes of raised serum ferritin Increased ferritin synthesis due to iron accumulation  Hereditary (genetic) haemochromatosis Hereditary acaeruloplasminaemia Atransferrinaemia Ferroportin disease    Increase in ferritin synthesis not associated with significant iron accumulation  Malignancies Malignant or reactive histiocytosis Gaucher disease Chronic inflammatory disorders Autoimmune disorders    Increased ferritin as a result of cellular damage  Liver diseases including: non-alcoholic steatohepatitis*   For the majority of persons with a raised serum ferritin, chronic inflammatory or infective causes as well as liver disease, alcohol  and malignancies will be the more likely conditions seen   Rheumatoid factor:   Rheumatoid Factor:  Antibodies directed against the Fc portion of immunoglobulins. Possess significant heterogeneity related to mutations within heavy and light chain genes.  Associated with Rheumatologic and Non-Rhematologic disorders. Rheumatic disorders -- Rheumatoid arthritis (RA), Sjgren's disease, Mixed connective tissue disease, Mixed cryoglobulinemia (types II and III), SLE, Polymyositis or dermatomyositis Nonrheumatic disorders -- Nonrheumatic disorders characterized by chronic antigenic stimulation (especially with circulating immune complexes or polyclonal B lymphocyte activation) commonly induce RF production. Included in this group are Condition  Aging (>age 61)  Infection Bacterial endocarditis Hepatitis B or hepatitis C  Tuberculosis Syphilis  Parasitic diseases  Leprosy  Other viral infections  Pulmonary disease Sarcoidosis  Interstitial pulmonary fibrosis  Silicosis Asbestosis  Miscellaneous Diseases Primary biliary cholangitis  Malignancy After multiple immunizations Smoking   Best-documented examples of viral infection (in addition to hepatitis B and C) are rubella, mumps, influenza, and HIV. Chagas' disease, Leishmaniasis, onchocerciasis, and schistosomiasis are major parasitic diseases. B cell neoplasms are the most common malignancies.  Evaluation:  Obtain CBC with diff, CMP,  ESR, CRP, ANA panel, CCP, CK HIV, Hepatitis serologies, Quantiferon gold, ACE, RPR, SPEP with IEP, free light chains  Consider blood ctx to evaluate for endocarditis, serologies and stool specimens to evaluate for parasites, PCR for parvo b19    Cancer Staging  No matching staging information was found for the patient.   No problem-specific Assessment & Plan notes found for this encounter.    No  orders of the defined types were placed in this encounter.   60  minutes was spent in patient care.   This included time spent preparing to see the patient (e.g., review of tests), obtaining and/or reviewing separately obtained history, counseling and educating the patient, ordering tests, or procedures; documenting clinical information in the electronic or other health record, independently interpreting results and communicating results to the patient as well as coordination of care.       All questions were answered. The patient knows to call the clinic with any problems, questions or concerns.  This note was electronically signed.    Barbee Cough, MD  03/25/2023 9:05 AM

## 2023-03-25 NOTE — Progress Notes (Deleted)
Office Visit Note  Patient: Nichole Gonzalez             Date of Birth: [birth year]           MRN: [id number]             PCP: Rochel Brome, MD Referring: Rochel Brome, MD Visit Date: 03/25/2023 Occupation: @GUAROCC @  Subjective:  No chief complaint on file.   History of Present Illness: [name] is a 47 y.o. female here for evaluaiton of joint pain and positive rheumatoid factor. ***     Activities of Daily Living:  Patient reports morning stiffness for *** {minute/hour:19697}.   Patient {ACTIONS;DENIES/REPORTS:21021675::"Denies"} nocturnal pain.  Difficulty dressing/grooming: {ACTIONS;DENIES/REPORTS:21021675::"Denies"} Difficulty climbing stairs: {ACTIONS;DENIES/REPORTS:21021675::"Denies"} Difficulty getting out of chair: {ACTIONS;DENIES/REPORTS:21021675::"Denies"} Difficulty using hands for taps, buttons, cutlery, and/or writing: {ACTIONS;DENIES/REPORTS:21021675::"Denies"}  No Rheumatology ROS completed.   PMFS History:  Patient Active Problem List   Diagnosis Date Noted   Elevated ferritin level 03/11/2023   Granulocytosis 03/11/2023   Nausea 02/26/2023   Other fatigue 02/26/2023   Mixed hyperlipidemia 01/10/2023   Daytime somnolence 01/10/2023   Migraine without aura and without status migrainosus, not intractable 01/10/2023   Xerostomia 01/10/2023   Joint pain 01/10/2023   Neck pain 01/10/2023   Visit for screening mammogram 09/30/2022   Mild recurrent major depression 09/30/2022   Class 1 obesity with serious comorbidity and body mass index (BMI) of 34.0 to 34.9 in adult 09/30/2022   High risk sexual behavior 11/06/2020   Gastroesophageal reflux disease 10/27/2020   Abnormal uterine bleeding 10/07/2020   Primary insomnia 09/08/2020   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   Allergic rhinitis 09/03/2015   Tobacco abuse 09/03/2015   Hyperthyroidism 04/21/2015    Past Medical History:  Diagnosis Date   Abnormal thyroid stimulating hormone (TSH)  level 02/17/2015   Abnormal uterine bleeding 10/07/2020   Allergic rhinitis 09/03/2015   Depression, recurrent ([place]) 09/08/2020   Gastroesophageal reflux disease 10/27/2020   High risk sexual behavior 11/06/2020   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   Primary insomnia 09/08/2020   Tobacco abuse 09/03/2015    Family History  Problem Relation Age of Onset   Thyroid disease Sister    Coronary artery disease Father    Heart attack Father    COPD Sister    Diabetes Mellitus II Sister    Past Surgical History:  Procedure Laterality Date   ABDOMINAL HYSTERECTOMY     APPENDECTOMY     CHOLECYSTECTOMY     DILATION AND CURETTAGE, DIAGNOSTIC / THERAPEUTIC     JOINT REPLACEMENT     KNEE ARTHROSCOPY     right foot     TUBAL LIGATION     Social History   Social History Narrative   ** Merged History Encounter **       Immunization History  Administered Date(s) Administered   Influenza,inj,Quad PF,6+ Mos 09/08/2020   Influenza-Unspecified 09/14/2014, 09/20/2022   Moderna Sars-Covid-2 Vaccination 12/20/2020   Tdap 08/20/2022     Objective: Vital Signs: LMP 01/04/2016 (Approximate)    Physical Exam   Musculoskeletal Exam: ***  CDAI Exam: CDAI Score: -- Patient Global: --; Provider Global: -- Swollen: --; Tender: -- Joint Exam 03/25/2023   No joint exam has been documented for this visit   There is currently no information documented on the homunculus. Go to the Rheumatology activity and complete the homunculus joint exam.  Investigation: No additional findings.  Imaging: MM 3D SCREEN BREAST BILATERAL  Result Date: 03/22/2023 CLINICAL DATA:  Screening. EXAM: DIGITAL  SCREENING BILATERAL MAMMOGRAM WITH TOMOSYNTHESIS AND CAD TECHNIQUE: Bilateral screening digital craniocaudal and mediolateral oblique mammograms were obtained. Bilateral screening digital breast tomosynthesis was performed. The images were evaluated with computer-aided detection. COMPARISON:  None available. ACR Breast  Density Category b: There are scattered areas of fibroglandular density. FINDINGS: There are no findings suspicious for malignancy. IMPRESSION: No mammographic evidence of malignancy. A result letter of this screening mammogram will be mailed directly to the patient. RECOMMENDATION: Screening mammogram in one year. (Code:SM-B-01Y) BI-RADS CATEGORY  1: Negative. Electronically Signed   By: Zerita Boers M.D.   On: 03/22/2023 15:37    Recent Labs: Lab Results  Component Value Date   WBC 12.2 (H) 01/03/2023   HGB 14.5 01/03/2023   PLT 301 01/03/2023   NA 138 01/03/2023   K 3.7 01/03/2023   CL 97 01/03/2023   CO2 23 01/03/2023   GLUCOSE 81 01/03/2023   BUN 6 01/03/2023   CREATININE 0.83 01/03/2023   BILITOT 0.2 01/03/2023   ALKPHOS 97 01/03/2023   AST 16 01/03/2023   ALT 14 01/03/2023   PROT 6.9 01/03/2023   ALBUMIN 4.3 01/03/2023   CALCIUM 9.8 01/03/2023   GFRAA 116 11/01/2020   QFTBGOLDPLUS Negative 03/11/2023    Speciality Comments: No specialty comments available.  Procedures:  No procedures performed Allergies: Morphine and related, Bee pollen, Codeine, Codeine, and Rosuvastatin   Assessment / Plan:     Visit Diagnoses: No diagnosis found.  Orders: No orders of the defined types were placed in this encounter.  No orders of the defined types were placed in this encounter.   Face-to-face time spent with patient was *** minutes. Greater than 50% of time was spent in counseling and coordination of care.  Follow-Up Instructions: No follow-ups on file.   Collier Salina, MD  Note - This record has been created using [employer].  Chart creation errors have been sought, but may not always  have been located. Such creation errors do not reflect on  the standard of medical care.

## 2023-03-27 DIAGNOSIS — R768 Other specified abnormal immunological findings in serum: Secondary | ICD-10-CM | POA: Insufficient documentation

## 2023-04-01 NOTE — Progress Notes (Unsigned)
Office Visit Note  Patient: Nichole Gonzalez             Date of Birth: 26-Apr-1976           MRN: [id number]             PCP: Blane Ohara, MD Referring: Blane Ohara, MD Visit Date: 04/02/2023 Occupation: [facility] Patient Access  Subjective:  New Patient (Initial Visit) (Patient states she has pain in her knees, neck, and back.)   History of Present Illness: Nichole Gonzalez is a 47 y.o. female here for evaluation of joint pain in multiple areas also having positive rheumatoid factor, leukocytosis and elevated serum ferritin.***   Joint pain very chornic, multiple previous injuries Right foot fractures Neck pain very bad Chiropracter with xray degenretaive diseases, no improvement with treatment there Headaches sometimes starting at temporal area b/l, sometimes starting at occiput Posterioer headache often gets a palpable knot and pinching sensation ***  Labs reviewed 02/2023 ANCAs neg SPEP wnl Hepatitis panel neg Quantiferon neg  12/2022 RF 16.2 Ferritin 376 ANA neg CCP neg ESR 4 CRP 10   Activities of Daily Living:  Patient reports morning stiffness for 12 hours.   Patient Denies nocturnal pain.  Difficulty dressing/grooming: Denies Difficulty climbing stairs: Denies Difficulty getting out of chair: Denies Difficulty using hands for taps, buttons, cutlery, and/or writing: Denies  Review of Systems  Constitutional:  Positive for fatigue.  HENT:  Positive for mouth dryness. Negative for mouth sores.   Eyes:  Positive for dryness.  Respiratory:  Negative for shortness of breath.   Cardiovascular:  Negative for chest pain and palpitations.  Gastrointestinal:  Negative for blood in stool, constipation and diarrhea.  Endocrine: Negative for increased urination.  Genitourinary:  Negative for involuntary urination.  Musculoskeletal:  Positive for joint pain, gait problem, joint pain, joint swelling, myalgias, muscle weakness, morning stiffness, muscle  tenderness and myalgias.  Skin:  Negative for color change, rash, hair loss and sensitivity to sunlight.  Allergic/Immunologic: Negative for susceptible to infections.  Neurological:  Positive for dizziness and headaches.  Hematological:  Negative for swollen glands.  Psychiatric/Behavioral:  Positive for depressed mood and sleep disturbance. The patient is nervous/anxious.     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   Class 1 obesity with serious comorbidity and body mass index (BMI) of 34.0 to 34.9 in adult 09/30/2022   High risk sexual behavior 11/06/2020   Gastroesophageal reflux disease 10/27/2020   Abnormal uterine bleeding 10/07/2020   Primary insomnia 09/08/2020   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   Allergic rhinitis 09/03/2015   Tobacco abuse 09/03/2015   Hyperthyroidism 04/21/2015    Past Medical History:  Diagnosis Date   Abnormal thyroid stimulating hormone (TSH) level 02/17/2015   Abnormal uterine bleeding 10/07/2020   Allergic rhinitis 09/03/2015   Depression, recurrent 09/08/2020   Gastroesophageal reflux disease 10/27/2020   High risk sexual behavior 11/06/2020   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   Primary insomnia 09/08/2020   Tobacco abuse 09/03/2015    Family History  Problem  Relation Age of Onset   Thyroid disease Sister    Coronary artery disease Father    Heart attack Father    COPD Sister    Diabetes Mellitus II Sister    Past Surgical History:  Procedure Laterality Date    ABDOMINAL HYSTERECTOMY     APPENDECTOMY     CHOLECYSTECTOMY     DILATION AND CURETTAGE, DIAGNOSTIC / THERAPEUTIC     JOINT REPLACEMENT     KNEE ARTHROSCOPY     right foot     TUBAL LIGATION     Social History   Social History Narrative   ** Merged History Encounter **       Immunization History  Administered Date(s) Administered   Influenza,inj,Quad PF,6+ Mos 09/08/2020   Influenza-Unspecified 09/14/2014, 09/20/2022   Moderna Sars-Covid-2 Vaccination 12/20/2020   Tdap 08/20/2022     Objective: Vital Signs: BP 126/86 (BP Location: Right Arm, Patient Position: Sitting, Cuff Size: Normal)   Pulse 89   Resp 14   Ht 5\' 5"  (1.651 m)   Wt 215 lb (97.5 kg)   LMP 01/04/2016 (Approximate)   BMI 35.78 kg/m    Physical Exam   Musculoskeletal Exam: ***  CDAI Exam: CDAI Score: -- Patient Global: --; Provider Global: -- Swollen: --; Tender: -- Joint Exam 04/02/2023   No joint exam has been documented for this visit   There is currently no information documented on the homunculus. Go to the Rheumatology activity and complete the homunculus joint exam.  Investigation: No additional findings.  Imaging: MM 3D SCREEN BREAST BILATERAL  Result Date: 03/22/2023 CLINICAL DATA:  Screening. EXAM: DIGITAL SCREENING BILATERAL MAMMOGRAM WITH TOMOSYNTHESIS AND CAD TECHNIQUE: Bilateral screening digital craniocaudal and mediolateral oblique mammograms were obtained. Bilateral screening digital breast tomosynthesis was performed. The images were evaluated with computer-aided detection. COMPARISON:  None available. ACR Breast Density Category b: There are scattered areas of fibroglandular density. FINDINGS: There are no findings suspicious for malignancy. IMPRESSION: No mammographic evidence of malignancy. A result letter of this screening mammogram will be mailed directly to the patient. RECOMMENDATION: Screening mammogram in one year. (Code:SM-B-01Y) BI-RADS CATEGORY  1: Negative.  Electronically Signed   By: Romona Curls M.D.   On: 03/22/2023 15:37    Recent Labs: Lab Results  Component Value Date   WBC 12.2 (H) 01/03/2023   HGB 14.5 01/03/2023   PLT 301 01/03/2023   NA 138 01/03/2023   K 3.7 01/03/2023   CL 97 01/03/2023   CO2 23 01/03/2023   GLUCOSE 81 01/03/2023   BUN 6 01/03/2023   CREATININE 0.83 01/03/2023   BILITOT 0.2 01/03/2023   ALKPHOS 97 01/03/2023   AST 16 01/03/2023   ALT 14 01/03/2023   PROT 6.9 01/03/2023   ALBUMIN 4.3 01/03/2023   CALCIUM 9.8 01/03/2023   GFRAA 116 11/01/2020   QFTBGOLDPLUS Negative 03/11/2023    Speciality Comments: No specialty comments available.  Procedures:  No procedures performed Allergies: Morphine and related, Bee pollen, Codeine, Codeine, and Rosuvastatin   Assessment / Plan:     Visit Diagnoses: Rheumatoid factor positive  Arthralgia, unspecified joint  Elevated ferritin level  Migraine without aura and without status migrainosus, not intractable  Orders: No orders of the defined types were placed in this encounter.  No orders of the defined types were placed in this encounter.   Face-to-face time spent with patient was *** minutes. Greater than 50% of time was spent in counseling and coordination of care.  Follow-Up Instructions: No follow-ups on  file.   Collier Salina, MD  Note - This record has been created using Dragon software.  Chart creation errors have been sought, but may not always  have been located. Such creation errors do not reflect on  the standard of medical care.

## 2023-04-02 ENCOUNTER — Encounter: Payer: Self-pay | Admitting: Internal Medicine

## 2023-04-02 ENCOUNTER — Ambulatory Visit: Payer: Commercial Managed Care - PPO | Attending: Internal Medicine | Admitting: Internal Medicine

## 2023-04-02 VITALS — BP 126/86 | HR 89 | Resp 14 | Ht 65.0 in | Wt 215.0 lb

## 2023-04-02 DIAGNOSIS — G43009 Migraine without aura, not intractable, without status migrainosus: Secondary | ICD-10-CM | POA: Diagnosis not present

## 2023-04-02 DIAGNOSIS — R768 Other specified abnormal immunological findings in serum: Secondary | ICD-10-CM | POA: Diagnosis not present

## 2023-04-02 DIAGNOSIS — M2550 Pain in unspecified joint: Secondary | ICD-10-CM | POA: Diagnosis not present

## 2023-04-02 DIAGNOSIS — R7989 Other specified abnormal findings of blood chemistry: Secondary | ICD-10-CM

## 2023-04-03 ENCOUNTER — Other Ambulatory Visit: Payer: Self-pay

## 2023-04-03 LAB — SEDIMENTATION RATE: Sed Rate: 25 mm/h — ABNORMAL HIGH (ref 0–20)

## 2023-04-05 LAB — MUTATED CITRULLINATED VIMENTIN (MCV) ANTIBODY: MUTATED CITRULLINATED VIMENTIN (MCV) AB: <20 U/mL (ref <20)

## 2023-04-05 LAB — C-REACTIVE PROTEIN: CRP: 10 mg/L — ABNORMAL HIGH (ref <8.0)

## 2023-04-15 ENCOUNTER — Other Ambulatory Visit: Payer: Self-pay | Admitting: Family Medicine

## 2023-04-15 DIAGNOSIS — M2559 Pain in other specified joint: Secondary | ICD-10-CM

## 2023-04-15 MED ORDER — CYCLOBENZAPRINE HCL 10 MG PO TABS
10.0000 mg | ORAL_TABLET | Freq: Every day | ORAL | 0 refills | Status: DC
Start: 2023-04-15 — End: 2023-09-11
  Filled 2023-04-15: qty 90, 90d supply, fill #0

## 2023-04-16 ENCOUNTER — Other Ambulatory Visit: Payer: Self-pay

## 2023-04-30 ENCOUNTER — Encounter: Payer: Self-pay | Admitting: Family Medicine

## 2023-04-30 ENCOUNTER — Other Ambulatory Visit: Payer: Self-pay | Admitting: Family Medicine

## 2023-04-30 DIAGNOSIS — E6609 Other obesity due to excess calories: Secondary | ICD-10-CM

## 2023-04-30 MED ORDER — PHENTERMINE HCL 37.5 MG PO CAPS
37.5000 mg | ORAL_CAPSULE | Freq: Every morning | ORAL | 0 refills | Status: DC
Start: 2023-04-30 — End: 2023-06-09
  Filled 2023-04-30: qty 30, 30d supply, fill #0

## 2023-04-30 MED ORDER — CETIRIZINE HCL 10 MG PO TABS
10.0000 mg | ORAL_TABLET | Freq: Every day | ORAL | 3 refills | Status: DC
  Filled 2023-04-30 – 2023-05-21 (×4): qty 90, 90d supply, fill #0

## 2023-05-01 ENCOUNTER — Other Ambulatory Visit: Payer: Self-pay

## 2023-05-07 ENCOUNTER — Other Ambulatory Visit: Payer: Self-pay

## 2023-05-10 ENCOUNTER — Other Ambulatory Visit: Payer: Self-pay

## 2023-05-21 ENCOUNTER — Other Ambulatory Visit: Payer: Self-pay

## 2023-05-21 ENCOUNTER — Other Ambulatory Visit: Payer: Self-pay | Admitting: Family Medicine

## 2023-05-21 ENCOUNTER — Encounter: Payer: Self-pay | Admitting: Family Medicine

## 2023-05-21 MED ORDER — CETIRIZINE HCL 10 MG PO TABS
10.0000 mg | ORAL_TABLET | Freq: Every day | ORAL | 3 refills | Status: DC
  Filled 2023-05-21: qty 90, 90d supply, fill #0
  Filled 2023-08-26: qty 90, 90d supply, fill #1
  Filled 2023-11-25: qty 90, 90d supply, fill #2
  Filled 2024-02-21: qty 90, 90d supply, fill #3

## 2023-05-21 MED ORDER — FLUTICASONE PROPIONATE 50 MCG/ACT NA SUSP
1.0000 | Freq: Every day | NASAL | 0 refills | Status: DC
  Filled 2023-05-21: qty 16, 60d supply, fill #0

## 2023-05-21 MED ORDER — LORAZEPAM 0.5 MG PO TABS
0.5000 mg | ORAL_TABLET | Freq: Two times a day (BID) | ORAL | 1 refills | Status: AC | PRN
  Filled 2023-05-21: qty 30, 15d supply, fill #0
  Filled 2023-09-19: qty 30, 15d supply, fill #1

## 2023-05-21 MED FILL — Topiramate Tab 100 MG: ORAL | 90 days supply | Qty: 90 | Fill #0 | Status: AC

## 2023-05-22 ENCOUNTER — Other Ambulatory Visit: Payer: Self-pay

## 2023-05-28 ENCOUNTER — Other Ambulatory Visit: Payer: Self-pay

## 2023-06-01 DIAGNOSIS — M47816 Spondylosis without myelopathy or radiculopathy, lumbar region: Secondary | ICD-10-CM | POA: Diagnosis not present

## 2023-06-01 DIAGNOSIS — W19XXXA Unspecified fall, initial encounter: Secondary | ICD-10-CM | POA: Diagnosis not present

## 2023-06-01 DIAGNOSIS — M25561 Pain in right knee: Secondary | ICD-10-CM | POA: Diagnosis not present

## 2023-06-01 DIAGNOSIS — M545 Low back pain, unspecified: Secondary | ICD-10-CM | POA: Diagnosis not present

## 2023-06-01 DIAGNOSIS — M25571 Pain in right ankle and joints of right foot: Secondary | ICD-10-CM | POA: Diagnosis not present

## 2023-06-01 DIAGNOSIS — M25521 Pain in right elbow: Secondary | ICD-10-CM | POA: Diagnosis not present

## 2023-06-02 ENCOUNTER — Encounter: Payer: Self-pay | Admitting: Family Medicine

## 2023-06-03 ENCOUNTER — Ambulatory Visit: Payer: Commercial Managed Care - PPO | Admitting: Physician Assistant

## 2023-06-03 ENCOUNTER — Ambulatory Visit (INDEPENDENT_AMBULATORY_CARE_PROVIDER_SITE_OTHER): Payer: Commercial Managed Care - PPO | Admitting: Physician Assistant

## 2023-06-03 ENCOUNTER — Encounter: Payer: Self-pay | Admitting: Physician Assistant

## 2023-06-03 VITALS — BP 120/82 | HR 98 | Temp 99.0°F | Resp 12 | Ht 65.0 in | Wt 206.0 lb

## 2023-06-03 DIAGNOSIS — M5440 Lumbago with sciatica, unspecified side: Secondary | ICD-10-CM | POA: Insufficient documentation

## 2023-06-03 NOTE — Telephone Encounter (Signed)
Called patient and made appointment for this afternoon with Marianne Sofia, PA-C.

## 2023-06-03 NOTE — Assessment & Plan Note (Signed)
Discussed with the patient is taking the medications that she received at the ER.  Stating that they are strong pain and muscle relaxers they can hopefully help her provide relief that the gabapentin is more focused on nerve pain rather than musculoskeletal pain.  Patient said that she would try those medicines and see if they were able to help her.  She will follow-up with [place] as needed and let us know if the medications do not help.

## 2023-06-03 NOTE — Progress Notes (Unsigned)
Subjective:  Patient ID: Nichole Gonzalez, female    DOB: [birth year]  Age: 47 y.o. MRN: [id number]  Chief Complaint  Patient presents with   Fall   Back Pain    HPI   Patient went to [facility] ED on 06/01/2023 for Fall at [place].  She reported that she slipped on some milk that was spilled on the floor at [employer]. Fell landing on her right side. Patient is complaining of pain in her lower back in her right elbow pain in her right  knee and right ankle.  They did x ray on her right ankle and it was negative, lumbar spine x ray showed no acute fraction or evidence of traumatic malaligment. Right knee x ray was negative, and right elbow x ray was negative.  They gave her meloxican 7.5 BID and methocarbamol 750 mg BID.She has not picked up the medicine yet. She is taking gabapentin and it is not helping. Her back pain is aching on lower area especially when she is standing up, the pain is worse  Patient admits to having some instances where she feels like she is walking on a cloud and feels very light. Patient is unsure if it could be from some of her medications. She is unable to narrow down to a specific thing that causes it or can make it worse. She states it can just randomly happen at work or at home. Patient denies any CBD use.      02/26/2023    8:32 AM 01/03/2023    7:41 AM 01/03/2023    7:40 AM 09/27/2022    9:02 AM 05/19/2021   11:20 AM  Depression screen PHQ 2/9  Decreased Interest 2 0 0 3 3  Down, Depressed, Hopeless 2 0 0 2 3  PHQ - 2 Score 4 0 0 5 6  Altered sleeping 3 1  3 1   Tired, decreased energy 3 2  3 3   Change in appetite 0 1  2 3   Feeling bad or failure about yourself  0 0  2 0  Trouble concentrating 3 1  3 3   Moving slowly or fidgety/restless 0 0  3 3  Suicidal thoughts 0 0  0 0  PHQ-9 Score 13 5  21 19   Difficult doing work/chores Somewhat difficult Somewhat difficult  Extremely dIfficult Extremely dIfficult        05/19/2021   11:20 AM  Fall  Risk   Falls in the past year? 0  Number falls in past yr: 0  Injury with Fall? 0  Risk for fall due to : No Fall Risks  Follow up Falls evaluation completed    Patient Care Team: Blane Ohara, MD as PCP - General (Family Medicine)   Review of Systems  Constitutional:  Positive for appetite change. Negative for chills, fatigue and fever.  HENT:  Negative for congestion, ear pain and sore throat.   Respiratory:  Negative for cough and shortness of breath.   Cardiovascular:  Negative for chest pain and palpitations.  Gastrointestinal:  Positive for nausea. Negative for abdominal pain, constipation, diarrhea and vomiting.  Endocrine: Negative for polydipsia, polyphagia and polyuria.  Genitourinary:  Negative for difficulty urinating and dysuria.  Musculoskeletal:  Positive for arthralgias and back pain. Negative for myalgias.  Skin:  Negative for rash.  Neurological:  Positive for tremors. Negative for headaches.  Psychiatric/Behavioral:  Positive for confusion. Negative for dysphoric mood. The patient is not nervous/anxious.     Current Outpatient Medications on  File Prior to Visit  Medication Sig Dispense Refill   Ascorbic Acid (VITAMIN C PO) Take by mouth.     aspirin-acetaminophen-caffeine (EXCEDRIN EXTRA STRENGTH) 250-250-65 MG per tablet Take 2 tablets by mouth every 6 (six) hours as needed for headache (and pain).     buPROPion (WELLBUTRIN XL) 150 MG 24 hr tablet Take 3 tablets (450 mg total) by mouth daily. 270 tablet 0   cetirizine (ZYRTEC ALLERGY) 10 MG tablet Take 1 tablet (10 mg total) by mouth daily. 90 tablet 3   citalopram (CELEXA) 20 MG tablet Take 1 tablet (20 mg total) by mouth daily. 90 tablet 0   cyclobenzaprine (FLEXERIL) 10 MG tablet Take 1 tablet (10 mg total) by mouth at bedtime. 90 tablet 0   dicyclomine (BENTYL) 20 MG tablet Take 1 tablet (20 mg total) by mouth in the morning and at bedtime. 180 tablet 0   EPIPEN 2-PAK 0.3 MG/0.3ML SOAJ injection   0    fluticasone (FLONASE) 50 MCG/ACT nasal spray Place 1 spray into both nostrils daily. 16 g 0   LORazepam (ATIVAN) 0.5 MG tablet Take 1 tablet (0.5 mg total) by mouth 2 (two) times daily as needed (panic attacks). 30 tablet 1   omeprazole (PRILOSEC) 40 MG capsule Take 1 capsule (40 mg total) by mouth daily. 90 capsule 3   ondansetron (ZOFRAN-ODT) 4 MG disintegrating tablet Dissolve one tablet and place on top of the tongue, then swallow every 8 hours as needed for nausea/vomit. 20 tablet 0   phentermine 37.5 MG capsule Take 1 capsule (37.5 mg total) by mouth every morning. 30 capsule 0   sodium fluoride (PREVIDENT 5000 DRY MOUTH) 1.1 % GEL dental gel Apply a thin ribbon to toothbrush. Brush teeth thoroughly at bedtime for two minutes. Do not eat or drink for 30 min after use. 56 g 3   SUMAtriptan (IMITREX) 50 MG tablet Take one tablet by mouth at onset of aura/migraine, may repeat in 2 hours once 9 tablet 2   topiramate (TOPAMAX) 100 MG tablet Take 1 tablet (100 mg total) by mouth daily. 90 tablet 0   traZODone (DESYREL) 100 MG tablet Take 1 tablet (100 mg total) by mouth at bedtime. 90 tablet 0   No current facility-administered medications on file prior to visit.   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   Primary insomnia 09/08/2020   Tobacco abuse 09/03/2015   Past Surgical History:  Procedure Laterality Date   ABDOMINAL HYSTERECTOMY     APPENDECTOMY     CHOLECYSTECTOMY     DILATION AND CURETTAGE, DIAGNOSTIC / THERAPEUTIC     JOINT REPLACEMENT     KNEE ARTHROSCOPY     right foot     TUBAL LIGATION      Family History  Problem Relation Age of Onset   Thyroid disease Sister    Coronary artery disease  Father    Heart attack Father    COPD Sister    Diabetes Mellitus II Sister    Social History   Socioeconomic History   Marital status: Married    Spouse name: Not on file   Number of children: 3   Years of education: Not on file   Highest education level: Not on file  Occupational History   Not on file  Tobacco Use   Smoking status: Former    Packs/day: 1.00    Years: 30.00    Additional pack years: 0.00    Total pack years: 30.00    Types: Cigarettes    Passive exposure: Never   Smokeless tobacco: Never  Vaping Use   Vaping Use: Never used  Substance and Sexual Activity   Alcohol use: Yes    Alcohol/week: 8.0 standard drinks of alcohol    Types: 8 Standard drinks or equivalent per week    Comment: socially   Drug use: Never   Sexual activity: Not on file  Other Topics Concern   Not on file  Social History Narrative   ** Merged History Encounter **       Social Determinants of Health   Financial Resource Strain: Low Risk  (09/27/2022)   Overall Financial Resource Strain (CARDIA)    Difficulty of Paying Living Expenses: Not hard at all  Food Insecurity: No Food Insecurity (09/27/2022)   Hunger Vital Sign    Worried About Running Out of Food in the Last Year: Never true    Ran Out of Food in the Last Year: Never true  Transportation Needs: No Transportation Needs (09/27/2022)   PRAPARE - Administrator, Civil Service (Medical): No    Lack of Transportation (Non-Medical): No  Physical Activity: Inactive (09/27/2022)   Exercise Vital Sign    Days of Exercise per Week: 0 days    Minutes of Exercise per Session: 0 min  Stress: No Stress Concern Present (09/27/2022)   [employer] of Occupational Health - Occupational Stress Questionnaire    Feeling of Stress : Only a little  Social Connections: Moderately Isolated (09/27/2022)   Social Connection and Isolation Panel [NHANES]    Frequency of Communication with Friends and Family: More than three times  a week    Frequency of Social Gatherings with Friends and Family: More than three times a week    Attends Religious Services: Never    Database administrator or Organizations: No    Attends Engineer, structural: Never    Marital Status: Married    Objective:  BP 120/82   Pulse 98   Temp 99 F (37.2 C)   Resp 12   Ht 5\' 5"  (1.651 m)   Wt 206 lb (93.4 kg)   LMP 01/04/2016 (Approximate)   SpO2 98%   BMI 34.28 kg/m      06/03/2023    3:43 PM 04/02/2023    2:51 PM 04/02/2023    2:40 PM  BP/Weight  Systolic BP 120 126 126  Diastolic BP 82 86 81  Wt. (Lbs) 206  215  BMI 34.28 kg/m2  35.78 kg/m2    Physical Exam Vitals and nursing note reviewed.  Constitutional:      Appearance: Normal appearance.  Eyes:     Conjunctiva/sclera: Conjunctivae normal.  Pulmonary:     Effort: Pulmonary effort is normal.  Abdominal:     Palpations: Abdomen is soft.     Tenderness: There is no abdominal tenderness.  Musculoskeletal:        General: No tenderness.     Cervical back: Normal.     Lumbar back: No deformity, tenderness or bony tenderness. Decreased range of motion.     Comments: No tenderness noted on palpation of the spinous process or pain with palpation of the lumbar muscles.   Neurological:     Mental  Status: She is alert and oriented to person, place, and time.  Psychiatric:        Mood and Affect: Mood normal.        Behavior: Behavior normal.     Diabetic Foot Exam - Simple   No data filed      Lab Results  Component Value Date   WBC 12.2 (H) 01/03/2023   HGB 14.5 01/03/2023   HCT 41.5 01/03/2023   PLT 301 01/03/2023   GLUCOSE 81 01/03/2023   CHOL 209 (H) 01/03/2023   TRIG 157 (H) 01/03/2023   HDL 59 01/03/2023   LDLCALC 122 (H) 01/03/2023   ALT 14 01/03/2023   AST 16 01/03/2023   NA 138 01/03/2023   K 3.7 01/03/2023   CL 97 01/03/2023   CREATININE 0.83 01/03/2023   BUN 6 01/03/2023   CO2 23 01/03/2023   TSH 1.200 01/03/2023   HGBA1C 5.6  02/26/2023      Assessment & Plan:    Low back pain of thoracolumbar region with sciatica Assessment & Plan: Discussed with the patient is taking the medications that she received at the ER.  Stating that they are strong pain and muscle relaxers they can hopefully help her provide relief that the gabapentin is more focused on nerve pain rather than musculoskeletal pain.  Patient said that she would try those medicines and see if they were able to help her.  She will follow-up with [place] as needed and let us know if the medications do not help.      No orders of the defined types were placed in this encounter.   No orders of the defined types were placed in this encounter.    Follow-up: No follow-ups on file.   I,Marla I Leal-Borjas,acting as a scribe for [employer], PA.,have documented all relevant documentation on the behalf of Langley Gauss, PA,as directed by  Langley Gauss, PA while in the presence of Langley Gauss, [place].   An After Visit Summary was printed and given to the patient.  Langley Gauss, [place] [facility] [phone]

## 2023-06-06 ENCOUNTER — Encounter: Payer: Self-pay | Admitting: Physician Assistant

## 2023-06-09 ENCOUNTER — Other Ambulatory Visit: Payer: Self-pay | Admitting: Family Medicine

## 2023-06-09 DIAGNOSIS — E6609 Other obesity due to excess calories: Secondary | ICD-10-CM

## 2023-06-10 ENCOUNTER — Encounter: Payer: Self-pay | Admitting: Family Medicine

## 2023-06-10 ENCOUNTER — Other Ambulatory Visit: Payer: Self-pay

## 2023-06-10 MED ORDER — PHENTERMINE HCL 37.5 MG PO CAPS
37.5000 mg | ORAL_CAPSULE | Freq: Every morning | ORAL | 0 refills | Status: DC
Start: 2023-06-10 — End: 2023-07-22
  Filled 2023-06-10: qty 30, 30d supply, fill #0

## 2023-06-12 NOTE — Assessment & Plan Note (Signed)
Continue omeprazole 40 mg daily

## 2023-06-12 NOTE — Assessment & Plan Note (Addendum)
Recheck TSH 

## 2023-06-12 NOTE — Assessment & Plan Note (Addendum)
Continue Topamax 100 mg daily.

## 2023-06-12 NOTE — Progress Notes (Unsigned)
Subjective:  Patient ID: Nichole Gonzalez, female    DOB: 03-13-1976  Age: 47 y.o. MRN: [id number]  Chief Complaint  Patient presents with   Medical Management of Chronic Issues    HPI   Gastroesophageal reflux disease, unspecified whether esophagitis present Omeprazole 40 mg daily. Controlled.   Mixed hyperlipidemia Was on Crestor 10 mg daily, but stopped it due to gi upset.  Works out at gym. Decreased appetite. Eats breakfast and lunch. No supper.   Depression, major, recurrent, moderate (HCC) Wellbutrin 150 mg 3 in am, trazodone 100 mg at bedtime and Celexa 20 mg once daily   IBS-D/C: takes dicyclomine 20 mg once daily in am.      06/13/2023    8:37 AM 02/26/2023    8:32 AM 01/03/2023    7:41 AM 01/03/2023    7:40 AM 09/27/2022    9:02 AM  Depression screen PHQ 2/9  Decreased Interest 1 2 0 0 3  Down, Depressed, Hopeless 3 2 0 0 2  PHQ - 2 Score 4 4 0 0 5  Altered sleeping 1 3 1  3   Tired, decreased energy 1 3 2  3   Change in appetite 3 0 1  2  Feeling bad or failure about yourself  3 0 0  2  Trouble concentrating 3 3 1  3   Moving slowly or fidgety/restless 2 0 0  3  Suicidal thoughts 0 0 0  0  PHQ-9 Score 17 13 5  21   Difficult doing work/chores Very difficult Somewhat difficult Somewhat difficult  Extremely dIfficult        06/13/2023    8:37 AM  Fall Risk   Falls in the past year? 1  Number falls in past yr: 1  Injury with Fall? 1  Risk for fall due to : No Fall Risks  Follow up Falls evaluation completed;Falls prevention discussed    Patient Care Team: Iaan Oregel, Fritzi Mandes, MD as PCP - General (Family Medicine)   Review of Systems  Constitutional:  Negative for chills, fatigue and fever.  HENT:  Negative for congestion, rhinorrhea and sore throat.   Respiratory:  Negative for cough and shortness of breath.   Cardiovascular:  Negative for chest pain.  Gastrointestinal:  Negative for abdominal pain, constipation, diarrhea, nausea and vomiting.   Genitourinary:  Negative for dysuria and urgency.  Musculoskeletal:  Positive for back pain and myalgias (left calf pain with ambulation). Arthralgias: right knee pain. Neurological:  Negative for dizziness, weakness, light-headedness and headaches.  Psychiatric/Behavioral:  Negative for dysphoric mood. The patient is not nervous/anxious.     Current Outpatient Medications on File Prior to Visit  Medication Sig Dispense Refill   Ascorbic Acid (VITAMIN C PO) Take by mouth.     aspirin-acetaminophen-caffeine (EXCEDRIN EXTRA STRENGTH) 250-250-65 MG per tablet Take 2 tablets by mouth every 6 (six) hours as needed for headache (and pain). (Patient not taking: Reported on 06/13/2023)     buPROPion (WELLBUTRIN XL) 150 MG 24 hr tablet Take 3 tablets (450 mg total) by mouth daily. 270 tablet 0   cetirizine (ZYRTEC ALLERGY) 10 MG tablet Take 1 tablet (10 mg total) by mouth daily. 90 tablet 3   cyclobenzaprine (FLEXERIL) 10 MG tablet Take 1 tablet (10 mg total) by mouth at bedtime. 90 tablet 0   dicyclomine (BENTYL) 20 MG tablet Take 1 tablet (20 mg total) by mouth in the morning and at bedtime. 180 tablet 0   EPIPEN 2-PAK 0.3 MG/0.3ML SOAJ injection  0   fluticasone (FLONASE) 50 MCG/ACT nasal spray Place 1 spray into both nostrils daily. 16 g 0   LORazepam (ATIVAN) 0.5 MG tablet Take 1 tablet (0.5 mg total) by mouth 2 (two) times daily as needed (panic attacks). 30 tablet 1   omeprazole (PRILOSEC) 40 MG capsule Take 1 capsule (40 mg total) by mouth daily. 90 capsule 3   ondansetron (ZOFRAN-ODT) 4 MG disintegrating tablet Dissolve one tablet and place on top of the tongue, then swallow every 8 hours as needed for nausea/vomit. 20 tablet 0   phentermine 37.5 MG capsule Take 1 capsule (37.5 mg total) by mouth every morning. 30 capsule 0   sodium fluoride (PREVIDENT 5000 DRY MOUTH) 1.1 % GEL dental gel Apply a thin ribbon to toothbrush. Brush teeth thoroughly at bedtime for two minutes. Do not eat or drink  for 30 min after use. 56 g 3   SUMAtriptan (IMITREX) 50 MG tablet Take one tablet by mouth at onset of aura/migraine, may repeat in 2 hours once (Patient not taking: Reported on 06/13/2023) 9 tablet 2   topiramate (TOPAMAX) 100 MG tablet Take 1 tablet (100 mg total) by mouth daily. 90 tablet 0   traZODone (DESYREL) 100 MG tablet Take 1 tablet (100 mg total) by mouth at bedtime. 90 tablet 0   No current facility-administered medications on file prior to visit.   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   Primary insomnia 09/08/2020   Tobacco abuse 09/03/2015   Past Surgical History:  Procedure Laterality Date   ABDOMINAL HYSTERECTOMY     APPENDECTOMY     CHOLECYSTECTOMY     DILATION AND CURETTAGE, DIAGNOSTIC / THERAPEUTIC     JOINT REPLACEMENT     KNEE ARTHROSCOPY     right foot     TUBAL LIGATION      Family History  Problem Relation Age of Onset   Thyroid disease Sister    Coronary artery disease Father    Heart attack Father    COPD Sister    Diabetes Mellitus II Sister    Social History   Socioeconomic History   Marital status: Married    Spouse name: Not on file   Number of children: 3   Years of education: Not on file   Highest education level: Bachelor's degree (e.g., BA, AB, BS)  Occupational History   Not on file  Tobacco Use   Smoking status: Former    Packs/day: 1.00    Years: 30.00    Additional pack years: 0.00    Total pack years: 30.00    Types: Cigarettes    Passive exposure: Never   Smokeless tobacco: Never  Vaping Use   Vaping Use: Never used  Substance and Sexual Activity   Alcohol use: Yes    Alcohol/week: 8.0 standard drinks of alcohol    Types: 8 Standard  drinks or equivalent per week    Comment: socially   Drug use: Never   Sexual activity: Not on file  Other Topics Concern   Not on file  Social History Narrative   ** Merged History Encounter **       Social Determinants of Health   Financial Resource Strain: Low Risk  (06/12/2023)   Overall Financial  Resource Strain (CARDIA)    Difficulty of Paying Living Expenses: Not very hard  Food Insecurity: No Food Insecurity (06/12/2023)   Hunger Vital Sign    Worried About Running Out of Food in the Last Year: Never true    Ran Out of Food in the Last Year: Never true  Transportation Needs: No Transportation Needs (06/12/2023)   PRAPARE - Administrator, Civil Service (Medical): No    Lack of Transportation (Non-Medical): No  Physical Activity: Insufficiently Active (06/12/2023)   Exercise Vital Sign    Days of Exercise per Week: 3 days    Minutes of Exercise per Session: 30 min  Stress: Stress Concern Present (06/12/2023)   [employer] of Occupational Health - Occupational Stress Questionnaire    Feeling of Stress : Very much  Social Connections: Moderately Isolated (06/12/2023)   Social Connection and Isolation Panel [NHANES]    Frequency of Communication with Friends and Family: Never    Frequency of Social Gatherings with Friends and Family: More than three times a week    Attends Religious Services: Never    Database administrator or Organizations: No    Attends Engineer, structural: Never    Marital Status: Married    Objective:  BP 130/82   Pulse 80   Temp (!) 97.2 F (36.2 C)   Resp 16   Ht 5\' 6"  (1.676 m)   Wt 209 lb (94.8 kg)   LMP 01/04/2016 (Approximate)   BMI 33.73 kg/m      06/13/2023    8:32 AM 06/03/2023    3:43 PM 04/02/2023    2:51 PM  BP/Weight  Systolic BP 130 120 126  Diastolic BP 82 82 86  Wt. (Lbs) 209 206   BMI 33.73 kg/m2 34.28 kg/m2     Physical Exam Vitals reviewed.  Constitutional:      Appearance: Normal  appearance. She is normal weight.  Neck:     Vascular: No carotid bruit.  Cardiovascular:     Rate and Rhythm: Normal rate and regular rhythm.     Heart sounds: Normal heart sounds.  Pulmonary:     Effort: Pulmonary effort is normal. No respiratory distress.     Breath sounds: Normal breath sounds.  Abdominal:     General: Abdomen is flat. Bowel sounds are normal.     Palpations: Abdomen is soft.     Tenderness: There is no abdominal tenderness.  Musculoskeletal:     Comments: Left paraspinal muscle tenderness but no vertebral tenderness.    Neurological:     Mental Status: She is alert and oriented to person, place, and time.  Psychiatric:        Mood and Affect: Mood normal.        Behavior: Behavior normal.     Diabetic Foot Exam - Simple   No data filed      Lab Results  Component Value Date   WBC 11.3 (H) 06/13/2023   HGB 15.0 06/13/2023   HCT 45.4 06/13/2023   PLT 262 06/13/2023   GLUCOSE 87 06/13/2023   CHOL 215 (H) 06/13/2023   TRIG 137 06/13/2023   HDL 48 06/13/2023   LDLCALC 142 (H) 06/13/2023   ALT 26 06/13/2023   AST 22 06/13/2023   NA 145 (H) 06/13/2023   K 3.8 06/13/2023   CL 107 (H) 06/13/2023   CREATININE 0.88 06/13/2023   BUN 7 06/13/2023   CO2 24 06/13/2023   TSH 1.200 01/03/2023  HGBA1C 5.6 02/26/2023      Assessment & Plan:    Mixed hyperlipidemia Assessment & Plan: Well controlled.  No medicines.  Continue to work on eating a healthy diet and exercise.  Labs drawn today.    Orders: -     Comprehensive metabolic panel -     CBC with Differential/Platelet -     Lipid panel  Migraine without aura and without status migrainosus, not intractable Assessment & Plan: Continue Topamax 100 mg daily   Gastroesophageal reflux disease, unspecified whether esophagitis present Assessment & Plan: Continue omeprazole 40 mg daily.   Strain of lumbar region, subsequent encounter Assessment & Plan: Referring to Physical  therapy. Restart meloxicam daily in the evening with food.     Orders: -     Ambulatory referral to Physical Therapy  Mild recurrent major depression (HCC) Assessment & Plan: Increase celexa to 40 mg daily. Schedule an appointment with counselor.      Other orders -     Citalopram Hydrobromide; Take 1 tablet (40 mg total) by mouth daily.  Dispense: 90 tablet; Refill: 1     Meds ordered this encounter  Medications   citalopram (CELEXA) 40 MG tablet    Sig: Take 1 tablet (40 mg total) by mouth daily.    Dispense:  90 tablet    Refill:  1    Orders Placed This Encounter  Procedures   Comprehensive metabolic panel   CBC with Differential/Platelet   Lipid panel   Ambulatory referral to Physical Therapy     Follow-up: Return in about 3 months (around 09/13/2023).   I,Marla I Leal-Borjas,acting as a scribe for Blane Ohara, MD.,have documented all relevant documentation on the behalf of Blane Ohara, MD,as directed by  Blane Ohara, MD while in the presence of Blane Ohara, MD.   An After Visit Summary was printed and given to the patient.  Blane Ohara, MD [facility] [phone]

## 2023-06-12 NOTE — Assessment & Plan Note (Signed)
Well controlled.  No medicines.  Continue to work on eating a healthy diet and exercise.  Labs drawn today.   

## 2023-06-13 ENCOUNTER — Encounter: Payer: Self-pay | Admitting: Family Medicine

## 2023-06-13 ENCOUNTER — Ambulatory Visit (INDEPENDENT_AMBULATORY_CARE_PROVIDER_SITE_OTHER): Payer: Commercial Managed Care - PPO | Admitting: Family Medicine

## 2023-06-13 ENCOUNTER — Other Ambulatory Visit: Payer: Self-pay

## 2023-06-13 VITALS — BP 130/82 | HR 80 | Temp 97.2°F | Resp 16 | Ht 66.0 in | Wt 209.0 lb

## 2023-06-13 DIAGNOSIS — E0590 Thyrotoxicosis, unspecified without thyrotoxic crisis or storm: Secondary | ICD-10-CM

## 2023-06-13 DIAGNOSIS — S39012D Strain of muscle, fascia and tendon of lower back, subsequent encounter: Secondary | ICD-10-CM

## 2023-06-13 DIAGNOSIS — F330 Major depressive disorder, recurrent, mild: Secondary | ICD-10-CM

## 2023-06-13 DIAGNOSIS — G43009 Migraine without aura, not intractable, without status migrainosus: Secondary | ICD-10-CM

## 2023-06-13 DIAGNOSIS — K219 Gastro-esophageal reflux disease without esophagitis: Secondary | ICD-10-CM | POA: Diagnosis not present

## 2023-06-13 DIAGNOSIS — E782 Mixed hyperlipidemia: Secondary | ICD-10-CM

## 2023-06-13 MED ORDER — CITALOPRAM HYDROBROMIDE 40 MG PO TABS
40.0000 mg | ORAL_TABLET | Freq: Every day | ORAL | 1 refills | Status: DC
  Filled 2023-06-13: qty 90, 90d supply, fill #0
  Filled 2023-09-19: qty 90, 90d supply, fill #1

## 2023-06-13 NOTE — Patient Instructions (Addendum)
Increase celexa to 40 mg daily. Schedule an appointment with counselor.  Restart meloxicam daily in the evening with food.   Referral to Physical Therapy (wants to be seen in [place]).

## 2023-06-14 DIAGNOSIS — S39012A Strain of muscle, fascia and tendon of lower back, initial encounter: Secondary | ICD-10-CM | POA: Insufficient documentation

## 2023-06-14 LAB — CBC WITH DIFFERENTIAL/PLATELET
Basophils Absolute: 0.1 10*3/uL (ref 0.0–0.2)
Basos: 1 %
EOS (ABSOLUTE): 0.2 10*3/uL (ref 0.0–0.4)
Eos: 2 %
Hematocrit: 45.4 % (ref 34.0–46.6)
Hemoglobin: 15 g/dL (ref 11.1–15.9)
Immature Grans (Abs): 0 10*3/uL (ref 0.0–0.1)
Immature Granulocytes: 0 %
Lymphocytes Absolute: 4.1 10*3/uL — ABNORMAL HIGH (ref 0.7–3.1)
Lymphs: 36 %
MCH: 31.9 pg (ref 26.6–33.0)
MCHC: 33 g/dL (ref 31.5–35.7)
MCV: 97 fL (ref 79–97)
Monocytes Absolute: 0.5 10*3/uL (ref 0.1–0.9)
Monocytes: 5 %
Neutrophils Absolute: 6.4 10*3/uL (ref 1.4–7.0)
Neutrophils: 56 %
Platelets: 262 10*3/uL (ref 150–450)
RBC: 4.7 x10E6/uL (ref 3.77–5.28)
RDW: 12.2 % (ref 11.7–15.4)
WBC: 11.3 10*3/uL — ABNORMAL HIGH (ref 3.4–10.8)

## 2023-06-14 LAB — COMPREHENSIVE METABOLIC PANEL
ALT: 26 IU/L (ref 0–32)
AST: 22 IU/L (ref 0–40)
Albumin: 4.1 g/dL (ref 3.9–4.9)
Alkaline Phosphatase: 97 IU/L (ref 44–121)
BUN/Creatinine Ratio: 8 — ABNORMAL LOW (ref 9–23)
BUN: 7 mg/dL (ref 6–24)
Bilirubin Total: 0.3 mg/dL (ref 0.0–1.2)
CO2: 24 mmol/L (ref 20–29)
Calcium: 8.9 mg/dL (ref 8.7–10.2)
Chloride: 107 mmol/L — ABNORMAL HIGH (ref 96–106)
Creatinine, Ser: 0.88 mg/dL (ref 0.57–1.00)
Globulin, Total: 2.2 g/dL (ref 1.5–4.5)
Glucose: 87 mg/dL (ref 70–99)
Potassium: 3.8 mmol/L (ref 3.5–5.2)
Sodium: 145 mmol/L — ABNORMAL HIGH (ref 134–144)
Total Protein: 6.3 g/dL (ref 6.0–8.5)
eGFR: 81 mL/min/{1.73_m2} (ref >59)

## 2023-06-14 LAB — LIPID PANEL
Chol/HDL Ratio: 4.5 ratio — ABNORMAL HIGH (ref 0.0–4.4)
Cholesterol, Total: 215 mg/dL — ABNORMAL HIGH (ref 100–199)
HDL: 48 mg/dL (ref >39)
LDL Chol Calc (NIH): 142 mg/dL — ABNORMAL HIGH (ref 0–99)
Triglycerides: 137 mg/dL (ref 0–149)
VLDL Cholesterol Cal: 25 mg/dL (ref 5–40)

## 2023-06-14 NOTE — Assessment & Plan Note (Signed)
Referring to Physical therapy

## 2023-06-15 NOTE — Assessment & Plan Note (Signed)
Increase celexa to 40 mg daily. Schedule an appointment with counselor.

## 2023-06-16 ENCOUNTER — Encounter: Payer: Self-pay | Admitting: Family Medicine

## 2023-06-17 ENCOUNTER — Other Ambulatory Visit: Payer: Self-pay

## 2023-06-17 ENCOUNTER — Other Ambulatory Visit: Payer: Self-pay | Admitting: Family Medicine

## 2023-06-17 ENCOUNTER — Other Ambulatory Visit: Payer: Self-pay | Admitting: Physician Assistant

## 2023-06-17 DIAGNOSIS — K117 Disturbances of salivary secretion: Secondary | ICD-10-CM

## 2023-06-17 DIAGNOSIS — F332 Major depressive disorder, recurrent severe without psychotic features: Secondary | ICD-10-CM

## 2023-06-17 DIAGNOSIS — F5101 Primary insomnia: Secondary | ICD-10-CM

## 2023-06-17 MED ORDER — TRAZODONE HCL 100 MG PO TABS
100.0000 mg | ORAL_TABLET | Freq: Every day | ORAL | 0 refills | Status: DC
Start: 2023-06-17 — End: 2023-09-14
  Filled 2023-06-17: qty 90, 90d supply, fill #0

## 2023-06-17 MED ORDER — BUPROPION HCL ER (XL) 150 MG PO TB24
450.0000 mg | ORAL_TABLET | Freq: Every day | ORAL | 0 refills | Status: DC
Start: 2023-06-17 — End: 2023-09-14
  Filled 2023-06-17: qty 270, 90d supply, fill #0

## 2023-06-17 MED ORDER — SUMATRIPTAN SUCCINATE 50 MG PO TABS
ORAL_TABLET | ORAL | 2 refills | Status: DC
Start: 2023-06-17 — End: 2023-07-22
  Filled 2023-06-17: qty 4, 14d supply, fill #0
  Filled 2023-06-17: qty 5, 16d supply, fill #0
  Filled 2023-07-15: qty 9, 30d supply, fill #1

## 2023-06-18 ENCOUNTER — Other Ambulatory Visit: Payer: Self-pay

## 2023-06-18 MED ORDER — ATORVASTATIN CALCIUM 10 MG PO TABS
10.0000 mg | ORAL_TABLET | Freq: Every day | ORAL | 2 refills | Status: DC
  Filled 2023-06-18: qty 30, 30d supply, fill #0
  Filled 2023-07-15: qty 30, 30d supply, fill #1
  Filled 2023-08-13: qty 30, 30d supply, fill #2

## 2023-07-03 ENCOUNTER — Other Ambulatory Visit: Payer: Self-pay | Admitting: Oncology

## 2023-07-03 DIAGNOSIS — Z006 Encounter for examination for normal comparison and control in clinical research program: Secondary | ICD-10-CM

## 2023-07-05 ENCOUNTER — Encounter: Payer: Self-pay | Admitting: Family Medicine

## 2023-07-11 ENCOUNTER — Telehealth: Payer: Commercial Managed Care - PPO | Admitting: Physician Assistant

## 2023-07-11 ENCOUNTER — Other Ambulatory Visit: Payer: Self-pay | Admitting: Physician Assistant

## 2023-07-11 ENCOUNTER — Other Ambulatory Visit: Payer: Self-pay | Admitting: Family Medicine

## 2023-07-11 ENCOUNTER — Other Ambulatory Visit: Payer: Self-pay

## 2023-07-11 DIAGNOSIS — K219 Gastro-esophageal reflux disease without esophagitis: Secondary | ICD-10-CM

## 2023-07-11 DIAGNOSIS — R21 Rash and other nonspecific skin eruption: Secondary | ICD-10-CM | POA: Diagnosis not present

## 2023-07-11 MED ORDER — PERMETHRIN 5 % EX CREA
TOPICAL_CREAM | CUTANEOUS | 0 refills | Status: DC
Start: 2023-07-11 — End: 2023-07-22
  Filled 2023-07-11 (×2): qty 60, 7d supply, fill #0

## 2023-07-11 MED ORDER — TRIAMCINOLONE ACETONIDE 0.1 % EX CREA
1.0000 | TOPICAL_CREAM | Freq: Two times a day (BID) | CUTANEOUS | 0 refills | Status: DC
Start: 2023-07-11 — End: 2023-07-22
  Filled 2023-07-11: qty 30, 15d supply, fill #0

## 2023-07-11 MED ORDER — FLUTICASONE PROPIONATE 50 MCG/ACT NA SUSP
1.0000 | Freq: Every day | NASAL | 0 refills | Status: DC
  Filled 2023-07-11 – 2023-08-08 (×3): qty 16, 60d supply, fill #0

## 2023-07-11 MED ORDER — HYDROXYZINE PAMOATE 25 MG PO CAPS
25.0000 mg | ORAL_CAPSULE | Freq: Three times a day (TID) | ORAL | 0 refills | Status: DC | PRN
Start: 2023-07-11 — End: 2023-09-13
  Filled 2023-07-11: qty 30, 10d supply, fill #0

## 2023-07-11 NOTE — Progress Notes (Signed)
Virtual Visit Consent   Nichole Gonzalez, you are scheduled for a virtual visit with a [facility] provider today. Just as with appointments in the office, your consent must be obtained to participate. Your consent will be active for this visit and any virtual visit you may have with one of our providers in the next 365 days. If you have a MyChart account, a copy of this consent can be sent to you electronically.  As this is a virtual visit, video technology does not allow for your provider to perform a traditional examination. This may limit your provider's ability to fully assess your condition. If your provider identifies any concerns that need to be evaluated in person or the need to arrange testing (such as labs, EKG, etc.), we will make arrangements to do so. Although advances in technology are sophisticated, we cannot ensure that it will always work on either your end or our end. If the connection with a video visit is poor, the visit may have to be switched to a telephone visit. With either a video or telephone visit, we are not always able to ensure that we have a secure connection.  By engaging in this virtual visit, you consent to the provision of healthcare and authorize for your insurance to be billed (if applicable) for the services provided during this visit. Depending on your insurance coverage, you may receive a charge related to this service.  I need to obtain your verbal consent now. Are you willing to proceed with your visit today? Nichole Gonzalez has provided verbal consent on 07/11/2023 for a virtual visit (video or telephone). Piedad Climes, [place]  Date: 07/11/2023 12:29 PM  Virtual Visit via Video Note   I, Piedad Climes, connected with  Nichole Gonzalez  ([id number], 1976-07-31) on 07/11/23 at 12:15 PM EDT by a video-enabled telemedicine application and verified that I am speaking with the correct person using two identifiers.  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 The patient expressed understanding and agreed to proceed.    History of Present Illness: Nichole Gonzalez is a 47 y.o. who identifies as a female who was assigned female at birth, and is being seen today for rash of skin  that is highly pruritic and first noted Monday. Notes initially on her legs and forearms bilaterally and scattered bumps, almost blister-like lesions. Now spreading to scattered bumps of upper arms and on her chest. Denies any of lower back, buttock or groin area thus far. Notes co-worker with similar area now. Unsure of any infestation in her work space. Denies person with similar symptoms at home. Denies fever, chills. Denies recent travel.  Denies gardening or being outside much other than sitting on the porch at home. Denies change to soaps, lotions or detergents.   Called [facility] and recommended to do a visit with PCP or UC.  OTC -- rubbing alcohol, benadryl and topical benadryl cream.   HPI: HPI  Problems:  Patient Active Problem List   Diagnosis Date Noted   Strain of lumbar region 06/14/2023   Low back pain of thoracolumbar region with sciatica 06/03/2023   Rheumatoid factor positive 03/27/2023   Elevated ferritin level 03/11/2023   Granulocytosis 03/11/2023   Nausea 02/26/2023   Other fatigue 02/26/2023   Mixed hyperlipidemia 01/10/2023   Daytime somnolence 01/10/2023  Migraine without aura and without status migrainosus, not intractable 01/10/2023   Xerostomia 01/10/2023   Joint pain 01/10/2023   Neck pain 01/10/2023   Visit for screening mammogram 09/30/2022   Mild recurrent major depression (HCC) 09/30/2022   Class 1 obesity with serious comorbidity and body mass index (BMI) of 34.0 to 34.9 in adult 09/30/2022   High risk  sexual behavior 11/06/2020   Gastroesophageal reflux disease 10/27/2020   Abnormal uterine bleeding 10/07/2020   Primary insomnia 09/08/2020   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   Allergic rhinitis 09/03/2015   Tobacco abuse 09/03/2015    Allergies:  Allergies  Allergen Reactions   Morphine And Codeine Anaphylaxis   Bee Pollen     Anaphylaxis.    Codeine Other (See Comments)    Fainting.   Codeine Other (See Comments)   Rosuvastatin     GI upset   Medications:  Current Outpatient Medications:    Ascorbic Acid (VITAMIN C PO), Take by mouth., Disp: , Rfl:    aspirin-acetaminophen-caffeine (EXCEDRIN EXTRA STRENGTH) 250-250-65 MG per tablet, Take 2 tablets by mouth every 6 (six) hours as needed for headache (and pain). (Patient not taking: Reported on 06/13/2023), Disp: , Rfl:    atorvastatin (LIPITOR) 10 MG tablet, Take 1 tablet (10 mg total) by mouth daily., Disp: 30 tablet, Rfl: 2   buPROPion (WELLBUTRIN XL) 150 MG 24 hr tablet, Take 3 tablets (450 mg total) by mouth daily., Disp: 270 tablet, Rfl: 0   cetirizine (ZYRTEC ALLERGY) 10 MG tablet, Take 1 tablet (10 mg total) by mouth daily., Disp: 90 tablet, Rfl: 3   citalopram (CELEXA) 40 MG tablet, Take 1 tablet (40 mg total) by mouth daily., Disp: 90 tablet, Rfl: 1   cyclobenzaprine (FLEXERIL) 10 MG tablet, Take 1 tablet (10 mg total) by mouth at bedtime., Disp: 90 tablet, Rfl: 0   dicyclomine (BENTYL) 20 MG tablet, Take 1 tablet (20 mg total) by mouth in the morning and at bedtime., Disp: 180 tablet, Rfl: 0   EPIPEN 2-PAK 0.3 MG/0.3ML SOAJ injection, , Disp: , Rfl: 0   fluticasone (FLONASE) 50 MCG/ACT nasal spray, Place 1 spray into both nostrils daily., Disp: 16 g, Rfl: 0   LORazepam (ATIVAN) 0.5 MG tablet, Take 1 tablet (0.5 mg total) by mouth 2 (two) times daily as needed (panic attacks)., Disp: 30 tablet, Rfl: 1   omeprazole (PRILOSEC) 40 MG capsule, Take 1 capsule (40 mg total) by mouth daily., Disp: 90 capsule, Rfl: 3   phentermine  37.5 MG capsule, Take 1 capsule (37.5 mg total) by mouth every morning., Disp: 30 capsule, Rfl: 0   sodium fluoride (PREVIDENT 5000 DRY MOUTH) 1.1 % GEL dental gel, Apply a thin ribbon to toothbrush. Brush teeth thoroughly at bedtime for two minutes. Do not eat or drink for 30 min after use., Disp: 56 g, Rfl: 3   SUMAtriptan (IMITREX) 50 MG tablet, Take one tablet by mouth at onset of aura/migraine, may repeat in 2 hours once, Disp: 9 tablet, Rfl: 2   topiramate (TOPAMAX) 100 MG tablet, Take 1 tablet (100 mg total) by mouth daily., Disp: 90 tablet, Rfl: 0   traZODone (DESYREL) 100 MG tablet, Take 1 tablet (100 mg total) by mouth at bedtime., Disp: 90 tablet, Rfl: 0  Observations/Objective: Patient is well-developed, well-nourished in no acute distress.  Resting comfortably  at home.  Head is normocephalic, atraumatic.  No labored breathing.  Speech is clear and coherent with logical content.  Patient is alert and oriented at baseline.  Scattered erythematous papular lesions, seeming consistent with bites. Some areas are group most most are isolated of upper and lower extremities bilaterally and right upper anterior chest.   Assessment and Plan: 1. Rash and nonspecific skin eruption  Mostly scattered, few cluster of lesions on forearms. Suspect bites, likely mite. Does not seem consistent with scabies infestation. Supportive measures and OTC medications reviewed. Hydroxyzine and topical Kenalog per orders. Will give Rx Permethrin to apply at bedtime, letting dry completely. Then washing off in the AM. In-person follow-up recommendations given.   Follow Up Instructions: I discussed the assessment and treatment plan with the patient. The patient was provided an opportunity to ask questions and all were answered. The patient agreed with the plan and demonstrated an understanding of the instructions.  A copy of instructions were sent to the patient via MyChart unless otherwise noted below.   The  patient was advised to call back or seek an in-person evaluation if the symptoms worsen or if the condition fails to improve as anticipated.  Time:  I spent 10 minutes with the patient via telehealth technology discussing the above problems/concerns.    Piedad Climes, PA-C

## 2023-07-11 NOTE — Patient Instructions (Signed)
Nichole Gonzalez, thank you for joining Piedad Climes, PA-C for today's virtual visit.  While this provider is not your primary care provider (PCP), if your PCP is located in our provider database this encounter information will be shared with them immediately following your visit.   A [facility] MyChart account gives you access to today's visit and all your visits, tests, and labs performed at [facility] " click here if you don't have a  MyChart account or go to [email or web address]  Consent: (Patient) Nichole Gonzalez provided verbal consent for this virtual visit at the beginning of the encounter.  Current Medications:  Current Outpatient Medications:    Ascorbic Acid (VITAMIN C PO), Take by mouth., Disp: , Rfl:    aspirin-acetaminophen-caffeine (EXCEDRIN EXTRA STRENGTH) 250-250-65 MG per tablet, Take 2 tablets by mouth every 6 (six) hours as needed for headache (and pain). (Patient not taking: Reported on 06/13/2023), Disp: , Rfl:    atorvastatin (LIPITOR) 10 MG tablet, Take 1 tablet (10 mg total) by mouth daily., Disp: 30 tablet, Rfl: 2   buPROPion (WELLBUTRIN XL) 150 MG 24 hr tablet, Take 3 tablets (450 mg total) by mouth daily., Disp: 270 tablet, Rfl: 0   cetirizine (ZYRTEC ALLERGY) 10 MG tablet, Take 1 tablet (10 mg total) by mouth daily., Disp: 90 tablet, Rfl: 3   citalopram (CELEXA) 40 MG tablet, Take 1 tablet (40 mg total) by mouth daily., Disp: 90 tablet, Rfl: 1   cyclobenzaprine (FLEXERIL) 10 MG tablet, Take 1 tablet (10 mg total) by mouth at bedtime., Disp: 90 tablet, Rfl: 0   dicyclomine (BENTYL) 20 MG tablet, Take 1 tablet (20 mg total) by mouth in the morning and at bedtime., Disp: 180 tablet, Rfl: 0   EPIPEN 2-PAK 0.3 MG/0.3ML SOAJ injection, , Disp: , Rfl: 0   fluticasone (FLONASE) 50 MCG/ACT nasal spray, Place 1 spray into both nostrils daily., Disp: 16 g, Rfl: 0   LORazepam (ATIVAN) 0.5 MG tablet, Take 1 tablet (0.5 mg total) by  mouth 2 (two) times daily as needed (panic attacks)., Disp: 30 tablet, Rfl: 1   omeprazole (PRILOSEC) 40 MG capsule, Take 1 capsule (40 mg total) by mouth daily., Disp: 90 capsule, Rfl: 3   ondansetron (ZOFRAN-ODT) 4 MG disintegrating tablet, Dissolve one tablet and place on top of the tongue, then swallow every 8 hours as needed for nausea/vomit., Disp: 20 tablet, Rfl: 0   phentermine 37.5 MG capsule, Take 1 capsule (37.5 mg total) by mouth every morning., Disp: 30 capsule, Rfl: 0   sodium fluoride (PREVIDENT 5000 DRY MOUTH) 1.1 % GEL dental gel, Apply a thin ribbon to toothbrush. Brush teeth thoroughly at bedtime for two minutes. Do not eat or drink for 30 min after use., Disp: 56 g, Rfl: 3   SUMAtriptan (IMITREX) 50 MG tablet, Take one tablet by mouth at onset of aura/migraine, may repeat in 2 hours once, Disp: 9 tablet, Rfl: 2   topiramate (TOPAMAX) 100 MG tablet, Take 1 tablet (100 mg total) by mouth daily., Disp: 90 tablet, Rfl: 0   traZODone (DESYREL) 100 MG tablet, Take 1 tablet (100 mg total) by mouth at bedtime., Disp: 90 tablet, Rfl: 0   Medications ordered in this encounter:  No orders of the defined types were placed in this encounter.    *If you need refills on other medications prior to your next appointment, please contact your pharmacy*  Follow-Up: Call back or seek an in-person evaluation if the symptoms worsen or if  the condition fails to improve as anticipated.  [facility] [phone]  Other Instructions Keep skin clean and dry. Avoid excess heat as much as possible. The Hydroxyzine is to be taken as directed to help with inflammation and itching. You can apply the topical Kenalog cream to areas except for the face and groin.  Use the Permethrin to apply at bedtime, letting dry completely. Then washing off in the AM. You can repeat in 7 days.    If you have been instructed to have an in-person evaluation today at a local Urgent Care facility, please  use the link below. It will take you to a list of all of our available [facility], including address, phone number and hours of operation. Please do not delay care.  [facility]  If you or a family member do not have a primary care provider, use the link below to schedule a visit and establish care. When you choose a [facility] primary care physician or advanced practice provider, you gain a long-term partner in health. Find a Primary Care Provider  Learn more about [facility]'s in-office and virtual care options: [facility] Get Care Now

## 2023-07-12 ENCOUNTER — Other Ambulatory Visit: Payer: Self-pay

## 2023-07-12 ENCOUNTER — Other Ambulatory Visit: Payer: Self-pay | Admitting: Physician Assistant

## 2023-07-12 DIAGNOSIS — K219 Gastro-esophageal reflux disease without esophagitis: Secondary | ICD-10-CM

## 2023-07-12 MED FILL — Dicyclomine HCl Tab 20 MG: ORAL | 90 days supply | Qty: 180 | Fill #0 | Status: AC

## 2023-07-15 ENCOUNTER — Other Ambulatory Visit: Payer: Self-pay

## 2023-07-16 ENCOUNTER — Ambulatory Visit: Payer: Commercial Managed Care - PPO

## 2023-07-17 ENCOUNTER — Emergency Department (HOSPITAL_COMMUNITY): Payer: Commercial Managed Care - PPO

## 2023-07-17 ENCOUNTER — Other Ambulatory Visit: Payer: Self-pay

## 2023-07-17 ENCOUNTER — Encounter (HOSPITAL_COMMUNITY): Payer: Self-pay

## 2023-07-17 ENCOUNTER — Emergency Department (HOSPITAL_COMMUNITY)
Admission: EM | Admit: 2023-07-17 | Discharge: 2023-07-17 | Disposition: A | Discharge status: Home / Self Care | Payer: Commercial Managed Care - PPO

## 2023-07-17 DIAGNOSIS — I2510 Atherosclerotic heart disease of native coronary artery without angina pectoris: Secondary | ICD-10-CM | POA: Diagnosis not present

## 2023-07-17 DIAGNOSIS — E876 Hypokalemia: Secondary | ICD-10-CM | POA: Diagnosis not present

## 2023-07-17 DIAGNOSIS — K219 Gastro-esophageal reflux disease without esophagitis: Secondary | ICD-10-CM

## 2023-07-17 DIAGNOSIS — R0789 Other chest pain: Secondary | ICD-10-CM | POA: Diagnosis not present

## 2023-07-17 DIAGNOSIS — R079 Chest pain, unspecified: Secondary | ICD-10-CM | POA: Diagnosis not present

## 2023-07-17 DIAGNOSIS — Z7982 Long term (current) use of aspirin: Secondary | ICD-10-CM | POA: Insufficient documentation

## 2023-07-17 LAB — CBC
HCT: 42.3 % (ref 36.0–46.0)
Hemoglobin: 14.3 g/dL (ref 12.0–15.0)
MCH: 32.9 pg (ref 26.0–34.0)
MCHC: 33.8 g/dL (ref 30.0–36.0)
MCV: 97.5 fL (ref 80.0–100.0)
Platelets: 258 10*3/uL (ref 150–400)
RBC: 4.34 MIL/uL (ref 3.87–5.11)
RDW: 13.3 % (ref 11.5–15.5)
WBC: 10.1 10*3/uL (ref 4.0–10.5)
nRBC: 0 % (ref 0.0–0.2)

## 2023-07-17 LAB — TROPONIN I (HIGH SENSITIVITY)
Troponin I (High Sensitivity): 6 ng/L (ref <18)
Troponin I (High Sensitivity): 7 ng/L (ref <18)

## 2023-07-17 LAB — BASIC METABOLIC PANEL
Anion gap: 10 (ref 5–15)
BUN: 6 mg/dL (ref 6–20)
CO2: 24 mmol/L (ref 22–32)
Calcium: 8 mg/dL — ABNORMAL LOW (ref 8.9–10.3)
Chloride: 108 mmol/L (ref 98–111)
Creatinine, Ser: 0.83 mg/dL (ref 0.44–1.00)
GFR, Estimated: >60 mL/min (ref >60)
Glucose, Bld: 79 mg/dL (ref 70–99)
Potassium: 2.7 mmol/L — CL (ref 3.5–5.1)
Sodium: 142 mmol/L (ref 135–145)

## 2023-07-17 LAB — MAGNESIUM: Magnesium: 1.3 mg/dL — ABNORMAL LOW (ref 1.7–2.4)

## 2023-07-17 LAB — D-DIMER, QUANTITATIVE: D-Dimer, Quant: 0.27 ug/mL-FEU (ref 0.00–0.50)

## 2023-07-17 MED ORDER — LIDOCAINE VISCOUS HCL 2 % MT SOLN
15.0000 mL | Freq: Once | OROMUCOSAL | Status: AC
  Administered 2023-07-17: 15 mL via ORAL
  Filled 2023-07-17: qty 15

## 2023-07-17 MED ORDER — MAGNESIUM OXIDE -MG SUPPLEMENT 400 (240 MG) MG PO TABS
400.0000 mg | ORAL_TABLET | ORAL | Status: AC
  Administered 2023-07-17: 400 mg via ORAL
  Filled 2023-07-17: qty 1

## 2023-07-17 MED ORDER — OMEPRAZOLE 40 MG PO CPDR
40.0000 mg | DELAYED_RELEASE_CAPSULE | Freq: Every day | ORAL | 3 refills | Status: DC
Start: 2023-07-17 — End: 2023-07-22
  Filled 2023-07-17: qty 90, 90d supply, fill #0

## 2023-07-17 MED ORDER — ALUM & MAG HYDROXIDE-SIMETH 200-200-20 MG/5ML PO SUSP
30.0000 mL | Freq: Once | ORAL | Status: AC
  Administered 2023-07-17: 30 mL via ORAL
  Filled 2023-07-17: qty 30

## 2023-07-17 MED ORDER — ACETAMINOPHEN 500 MG PO TABS
1000.0000 mg | ORAL_TABLET | ORAL | Status: AC
  Administered 2023-07-17: 1000 mg via ORAL
  Filled 2023-07-17: qty 2

## 2023-07-17 MED ORDER — POTASSIUM CHLORIDE ER 10 MEQ PO TBCR
10.0000 meq | EXTENDED_RELEASE_TABLET | Freq: Every day | ORAL | 0 refills | Status: DC
  Filled 2023-07-17: qty 5, 5d supply, fill #0

## 2023-07-17 MED ORDER — POTASSIUM CHLORIDE CRYS ER 20 MEQ PO TBCR
40.0000 meq | EXTENDED_RELEASE_TABLET | Freq: Once | ORAL | Status: AC
  Administered 2023-07-17: 40 meq via ORAL
  Filled 2023-07-17: qty 2

## 2023-07-17 NOTE — ED Notes (Addendum)
Date and time results received: 07/17/23 1:20 PM  (use smartphrase ".now" to insert current time)  Test: K+ Critical Value: 2.7  Name of Provider Notified: Dow Adolph, [place]  Awaiting orders

## 2023-07-17 NOTE — ED Provider Notes (Signed)
[place] EMERGENCY DEPARTMENT AT [facility] Provider Note   CSN: [id number] Arrival date & time: 07/17/23  1152     History  Chief Complaint  Patient presents with   Chest Pain    Nichole Gonzalez is a 47 y.o. female.   Chest Pain Patient is a 47 year old female with past medical history significant for depression, insomnia, tobacco use  Patient presents emergency room today with complaints of constant chest pain since 5 AM this morning she states that it is nonexertional and seems to be worse if respirations.  She states she does not feel particularly short of breath although it does hurt when she takes a deep breath.  She states it is sore in her chest when she pushes on it.  She denies any trauma to her chest.  She has not had any coughing or hemoptysis.  No unilateral or bilateral leg swelling.  She stopped smoking approximately 7 months ago. Not on any estrogen containing medications.  No history of VTE.  No recent travel or immobilization or surgery or hospitalization  She denies any aggravating or mitigating her pain other than deep breaths.  She is not nauseous.      Home Medications Prior to Admission medications   Medication Sig Start Date End Date Taking? Authorizing Provider  potassium chloride (KLOR-CON) 10 MEQ tablet Take 1 tablet (10 mEq total) by mouth daily. 07/17/23  Yes Kizzy Olafson, Stevphen Meuse S, PA  Ascorbic Acid (VITAMIN C PO) Take by mouth.    [provider]  aspirin-acetaminophen-caffeine (EXCEDRIN EXTRA STRENGTH) [phone] MG per tablet Take 2 tablets by mouth every 6 (six) hours as needed for headache (and pain). Patient not taking: Reported on 06/13/2023    [provider]  atorvastatin (LIPITOR) 10 MG tablet Take 1 tablet (10 mg total) by mouth daily. 06/18/23   Cox, Fritzi Mandes, MD  buPROPion (WELLBUTRIN XL) 150 MG 24 hr tablet Take 3 tablets (450 mg total) by mouth daily. 06/17/23   CoxFritzi Mandes, MD  cetirizine (ZYRTEC ALLERGY)  10 MG tablet Take 1 tablet (10 mg total) by mouth daily. 05/21/23   Cox, Fritzi Mandes, MD  citalopram (CELEXA) 40 MG tablet Take 1 tablet (40 mg total) by mouth daily. 06/13/23   Cox, Fritzi Mandes, MD  cyclobenzaprine (FLEXERIL) 10 MG tablet Take 1 tablet (10 mg total) by mouth at bedtime. 04/15/23   CoxFritzi Mandes, MD  dicyclomine (BENTYL) 20 MG tablet Take 1 tablet (20 mg total) by mouth in the morning and at bedtime. 07/12/23   Marianne Sofia, PA-C  EPIPEN 2-PAK 0.3 MG/0.3ML SOAJ injection  05/16/15   [provider]  fluticasone (FLONASE) 50 MCG/ACT nasal spray Place 1 spray into both nostrils daily. 07/11/23   CoxFritzi Mandes, MD  hydrOXYzine (VISTARIL) 25 MG capsule Take 1 capsule (25 mg total) by mouth every 8 (eight) hours as needed. 07/11/23   Waldon Merl, PA-C  LORazepam (ATIVAN) 0.5 MG tablet Take 1 tablet (0.5 mg total) by mouth 2 (two) times daily as needed (panic attacks). 05/21/23   Cox, Fritzi Mandes, MD  omeprazole (PRILOSEC) 40 MG capsule Take 1 capsule (40 mg total) by mouth daily. 07/17/23   Gailen Shelter, PA  permethrin (ELIMITE) 5 % cream Apply in evening from neck down, letting dry. Wash off first thing in the morning. May repeat once in 7 days. 07/11/23   Waldon Merl, PA-C  phentermine 37.5 MG capsule Take 1 capsule (37.5 mg total) by mouth every morning. 06/10/23   Cox,  Kirsten, MD  sodium fluoride (PREVIDENT 5000 DRY MOUTH) 1.1 % GEL dental gel Apply a thin ribbon to toothbrush. Brush teeth thoroughly at bedtime for two minutes. Do not eat or drink for 30 min after use. 01/01/23     SUMAtriptan (IMITREX) 50 MG tablet Take one tablet by mouth at onset of aura/migraine, may repeat in 2 hours once 06/17/23   Cox, Fritzi Mandes, MD  topiramate (TOPAMAX) 100 MG tablet Take 1 tablet (100 mg total) by mouth daily. 05/21/23   CoxFritzi Mandes, MD  traZODone (DESYREL) 100 MG tablet Take 1 tablet (100 mg total) by mouth at bedtime. 06/17/23   Cox, Fritzi Mandes, MD  triamcinolone cream (KENALOG) 0.1 % Apply 1  Application topically 2 (two) times daily. 07/11/23   Waldon Merl, PA-C      Allergies    Morphine and codeine, Bee pollen, Codeine, Codeine, and Rosuvastatin    Review of Systems   Review of Systems  Cardiovascular:  Positive for chest pain.    Physical Exam Updated Vital Signs BP 112/80 (BP Location: Left Arm)   Pulse 66   Temp 98.6 F (37 C) (Oral)   Resp 17   Ht 5\' 6"  (1.676 m)   Wt 95.3 kg   LMP 01/04/2016 (Approximate)   SpO2 96%   BMI 33.89 kg/m  Physical Exam Vitals and nursing note reviewed.  Constitutional:      General: She is not in acute distress. HENT:     Head: Normocephalic and atraumatic.     Nose: Nose normal.     Mouth/Throat:     Mouth: Mucous membranes are moist.  Eyes:     General: No scleral icterus. Cardiovascular:     Rate and Rhythm: Normal rate and regular rhythm.     Pulses: Normal pulses.     Heart sounds: Normal heart sounds.  Pulmonary:     Effort: Pulmonary effort is normal. No respiratory distress.     Breath sounds: No wheezing.  Chest:     Chest wall: Tenderness present.  Abdominal:     Palpations: Abdomen is soft.     Tenderness: There is no abdominal tenderness. There is no guarding or rebound.  Musculoskeletal:     Cervical back: Normal range of motion.     Right lower leg: No edema.     Left lower leg: No edema.     Comments: Symmetric lower extremities and no tenderness  Skin:    General: Skin is warm and dry.     Capillary Refill: Capillary refill takes less than 2 seconds.  Neurological:     Mental Status: She is alert. Mental status is at baseline.  Psychiatric:        Mood and Affect: Mood normal.        Behavior: Behavior normal.     ED Results / Procedures / Treatments   Labs (all labs ordered are listed, but only abnormal results are displayed) Labs Reviewed  BASIC METABOLIC PANEL - Abnormal; Notable for the following components:      Result Value   Potassium 2.7 (*)    Calcium 8.0 (*)    All  other components within normal limits  MAGNESIUM - Abnormal; Notable for the following components:   Magnesium 1.3 (*)    All other components within normal limits  CBC  D-DIMER, QUANTITATIVE  TROPONIN I (HIGH SENSITIVITY)  TROPONIN I (HIGH SENSITIVITY)    EKG EKG Interpretation Date/Time:  Wednesday July 17 2023 12:00:34 EDT Ventricular Rate:  76 PR Interval:  174 QRS Duration:  102 QT Interval:  402 QTC Calculation: 452 R Axis:   40  Text Interpretation: Normal sinus rhythm Low voltage QRS Nonspecific T wave abnormality Abnormal ECG When compared with ECG of 18-Jan-2016 09:18, PREVIOUS ECG IS PRESENT No significant change since last tracing Confirmed by Margarita Grizzle [phone]) on 07/17/2023 2:19:42 PM  Radiology DG Chest 2 View  Result Date: 07/17/2023 CLINICAL DATA:  cp EXAM: CHEST - 2 VIEW COMPARISON:  CXR 01/18/16 FINDINGS: No pleural effusion. No pneumothorax. No focal airspace opacity. Slightly prominent bilateral interstitial opacities could represent pulmonary venous congestion or atypical infection. No radiographically apparent displaced rib fractures. Visualized upper abdomen is unremarkable. Surgical clips in the right upper quadrant. Redemonstrated superior endplate compression deformity at L1. IMPRESSION: Slightly prominent bilateral interstitial opacities could represent pulmonary venous congestion or atypical infection. Electronically Signed   By: Lorenza Cambridge M.D.   On: 07/17/2023 12:50    Procedures Procedures    Medications Ordered in ED Medications  potassium chloride SA (KLOR-CON M) CR tablet 40 mEq (40 mEq Oral Given 07/17/23 1555)  acetaminophen (TYLENOL) tablet 1,000 mg (1,000 mg Oral Given 07/17/23 1555)  alum & mag hydroxide-simeth (MAALOX/MYLANTA) 200-200-20 MG/5ML suspension 30 mL (30 mLs Oral Given 07/17/23 1555)    And  lidocaine (XYLOCAINE) 2 % viscous mouth solution 15 mL (15 mLs Oral Given 07/17/23 1556)  magnesium oxide (MAG-OX) tablet 400 mg (400 mg  Oral Given 07/17/23 1700)    ED Course/ Medical Decision Making/ A&P                             Medical Decision Making Amount and/or Complexity of Data Reviewed Labs: ordered. Radiology: ordered.  Risk OTC drugs. Prescription drug management.   This patient presents to the ED for concern of CP, this involves a number of treatment options, and is a complaint that carries with it a moderate to high risk of complications and morbidity. A differential diagnosis was considered for the patient's symptoms which is discussed below:   The emergent causes of chest pain include: Acute coronary syndrome, tamponade, pericarditis/myocarditis, aortic dissection, pulmonary embolism, tension pneumothorax, pneumonia, and esophageal rupture.  I do not believe the patient has an emergent cause of chest pain, other urgent/non-acute considerations include, but are not limited to: chronic angina, aortic stenosis, cardiomyopathy, mitral valve prolapse, pulmonary hypertension, aortic insufficiency, right ventricular hypertrophy, pleuritis, bronchitis, pneumothorax, tumor, gastroesophageal reflux disease (GERD), esophageal spasm, Mallory-Weiss syndrome, peptic ulcer disease, pancreatitis, functional gastrointestinal pain, cervical or thoracic disk disease or arthritis, shoulder arthritis, costochondritis, subacromial bursitis, anxiety or panic attack, herpes zoster, breast disorders, chest wall tumors, thoracic outlet syndrome, mediastinitis.    Co morbidities: Discussed in HPI   Brief History:  Patient is a 47 year old female with past medical history significant for depression, insomnia, tobacco use  Patient presents emergency room today with complaints of constant chest pain since 5 AM this morning she states that it is nonexertional and seems to be worse if respirations.  She states she does not feel particularly short of breath although it does hurt when she takes a deep breath.  She states it is sore in  her chest when she pushes on it.  She denies any trauma to her chest.  She has not had any coughing or hemoptysis.  No unilateral or bilateral leg swelling.  She stopped smoking approximately 7 months ago. Not on any estrogen containing medications.  No history of VTE.  No recent travel or immobilization or surgery or hospitalization  She denies any aggravating or mitigating her pain other than deep breaths.  She is not nauseous.    EMR reviewed including pt PMHx, past surgical history and past visits to ER.   See HPI for more details   Lab Tests:   I ordered and independently interpreted labs. Labs notable for Magnesium of 1.3  Imaging Studies:  NAD. I personally reviewed all imaging studies and no acute abnormality found. I agree with radiology interpretation.    Cardiac Monitoring:   EKG non-ischemic QT is nearly normal.   Medicines ordered:  I ordered medication including magnesium oxide, GI cocktail, Tylenol, p.o. potassium for hypokalemia Reevaluation of the patient after these medicines showed that the patient improved I have reviewed the patients home medicines and have made adjustments as needed   Critical Interventions:     Consults/Attending Physician      Reevaluation:  After the interventions noted above I re-evaluated patient and found that they have :improved   Social Determinants of Health:      Problem List / ED Course:  Atypical chest pain risk factors include tobacco use however patient has no history of CAD or stents or cardiac evaluation.  Will recommend that she follow-up outpatient with cardiology but I do not believe that she would benefit from inpatient monitoring of her pleuritic, nonexertional chest pain.  PE very unlikely she is PERC negative and D-dimer within normal limits.  Very strict return precautions provided to patient she will return the emergency room for any new or concerning symptoms including worsening pain any  dyspnea syncope lightheadedness or dizziness. Hypomagnesemia, hypokalemia will require additional workup outpatient.   Dispostion:  After consideration of the diagnostic results and the patients response to treatment, I feel that the patent would benefit from discharge home w close outpatient follow up.    Final Clinical Impression(s) / ED Diagnoses Final diagnoses:  Atypical chest pain  Hypokalemia  Hypomagnesemia    Rx / DC Orders ED Discharge Orders          Ordered    omeprazole (PRILOSEC) 40 MG capsule  Daily        07/17/23 1703    Ambulatory referral to Cardiology       Comments: If you have not heard from the Cardiology office within the next 72 hours please call [phone].   07/17/23 1704    potassium chloride (KLOR-CON) 10 MEQ tablet  Daily        07/17/23 1929              Gailen Shelter, [place] 07/17/23 2021    Bethann Berkshire, MD 07/19/23 1750

## 2023-07-17 NOTE — Discharge Instructions (Addendum)
Your workup today is quite reassuring from a chest pain standpoint.    Continue to not smoke.  Take your prescribed medications as prescribed, specifically I would like you to continue taking your omeprazole which I have represcribed for you.  Please return to the emergency room for any new or concerning symptoms such as coughing up blood, fevers, worsening pain in your chest, difficulty breathing.  Your potassium and magnesium levels were found to be quite low today.  I have given you several days of potassium supplements to take.  You will need to follow this up with a recheck with your primary care doctor.   I have given you the information for a cardiologist to follow-up with -however, as we discussed your chest pain workup has been reassuring from a cardiac standpoint.

## 2023-07-17 NOTE — ED Triage Notes (Signed)
Pt to ED from work c/o chest pain that started this morning, reports constant in nature, worse with deep breath.

## 2023-07-18 ENCOUNTER — Other Ambulatory Visit: Payer: Self-pay

## 2023-07-18 ENCOUNTER — Encounter: Payer: Self-pay | Admitting: Family Medicine

## 2023-07-19 ENCOUNTER — Other Ambulatory Visit: Payer: Self-pay

## 2023-07-21 NOTE — Progress Notes (Unsigned)
Cardiology Office Note:    Date:  07/22/2023   ID:  Nichole Gonzalez, DOB 04-26-1976, MRN [id number]  PCP:  Blane Ohara, MD  Cardiologist:  Norman Herrlich, MD    Referring MD: Gailen Shelter, PA    ASSESSMENT:    1. Precordial pain   2. Atherosclerosis of aorta (HCC)   3. Mixed hyperlipidemia   4. Family history of premature CAD    PLAN:    In order of problems listed above:  Symptoms are more consistent with pleuritic or pericardial source of pain and quite suggestive of pericarditis with pain on swallowing although she has no rub has not had a fever and her initial EKG did not have typical changes we will recheck in the office today and do labs including a CRP and sedimentation rate Echocardiogram first available in the [facility] system for pericarditis She will be scheduled for a cardiac CTA in a few weeks for completeness I do not think this is acute ischemic presentation Continue her lipid-lowering therapy Brief course of nonsteroidal anti-inflammatory drugs Celebrex 100 mg twice daily 10 days   Next appointment: Follow-up with me or Wallis Bamberg, NP 3 weeks no findings of pericarditis   Medication Adjustments/Labs and Tests Ordered: Current medicines are reviewed at length with the patient today.  Concerns regarding medicines are outlined above.  No orders of the defined types were placed in this encounter.  No orders of the defined types were placed in this encounter.    History of Present Illness:    Nichole Gonzalez is a 47 y.o. female with a hx of chest pain last seen 07/17/2023.  She was seen [facility] ED 07/17/2023 for chest pain.  Her EKG showed sinus rhythm low voltage and nonspecific T wave abnormality.  High-sensitivity troponin was low at 6 D-dimer was 0.27 chest x-ray shows mild abnormality HF vs atelectasis.  She had an echocardiogram at [facility] 06/11/2023 showing normal left ventricular size wall thickness function EF 55 to  60%.  Recent labs 06/06/2023 hemoglobin 14.5 platelets 317,000 CMP normal with creatinine 0.54 potassium 4.3 sodium 145 troponin T was assessed found to be normal.  CT of the abdomen and pelvis 2021 showed atherosclerosis.  Compliance with diet, lifestyle and medications: Yes  She has been sick since July 24.  When she awoke in the morning she was having chest pain left costochondral area that radiates through to the left breast and into her back.  It was pleuritic in nature.  No shortness of breath no risk factors for venous thromboembolism.  When seen in the emergency room she was having a cough but it was not prominent no fever or chills.  After discharge her cough has become productive and her chest pain has persisted and this morning it was severe and she also has pain when she swallows quite suggestive of pericarditis.  The symptoms are not anginal they are not exertional and is not relieved with rest.  Surprisingly she has had no fever or chills and no one else at home has been sick.  She continued to work but she was sent home from work today because of her respiratory symptoms.  She is a previous smoker she was recently started on lipid-lowering therapy for dyslipidemia has a profound history of premature CAD with father dying of heart disease at age 43.  Her recent LDL 142 cholesterol 215.  In the ER she had a low D-dimer she also had hypokalemia and hypomagnesemia She did  receive oral supplement  She has had some mild shortness of breath no hemoptysis edema orthopnea palpitation or syncope  She has no known history of heart disease congenital rheumatic or atrial fibrillation  Past Medical History:  Diagnosis Date   Abnormal thyroid stimulating hormone (TSH) level 02/17/2015   Abnormal uterine bleeding 10/07/2020   Allergic rhinitis 09/03/2015   Depression, recurrent (HCC) 09/08/2020   Gastroesophageal reflux disease 10/27/2020   High risk sexual behavior 11/06/2020   Hymenoptera  allergy 09/03/2015   Primary insomnia 09/08/2020   Tobacco abuse 09/03/2015    Current Medications: Current Meds  Medication Sig   Ascorbic Acid (VITAMIN C PO) Take by mouth.   atorvastatin (LIPITOR) 10 MG tablet Take 1 tablet (10 mg total) by mouth daily.   buPROPion (WELLBUTRIN XL) 150 MG 24 hr tablet Take 3 tablets (450 mg total) by mouth daily.   cetirizine (ZYRTEC ALLERGY) 10 MG tablet Take 1 tablet (10 mg total) by mouth daily.   citalopram (CELEXA) 40 MG tablet Take 1 tablet (40 mg total) by mouth daily.   cyclobenzaprine (FLEXERIL) 10 MG tablet Take 1 tablet (10 mg total) by mouth at bedtime.   EPIPEN 2-PAK 0.3 MG/0.3ML SOAJ injection    fluticasone (FLONASE) 50 MCG/ACT nasal spray Place 1 spray into both nostrils daily.   hydrOXYzine (VISTARIL) 25 MG capsule Take 1 capsule (25 mg total) by mouth every 8 (eight) hours as needed.   LORazepam (ATIVAN) 0.5 MG tablet Take 1 tablet (0.5 mg total) by mouth 2 (two) times daily as needed (panic attacks).   pantoprazole (PROTONIX) 40 MG tablet Take 1 tablet (40 mg total) by mouth daily.   potassium chloride (KLOR-CON) 10 MEQ tablet Take 1 tablet (10 mEq total) by mouth daily.   sodium fluoride (PREVIDENT 5000 DRY MOUTH) 1.1 % GEL dental gel Apply a thin ribbon to toothbrush. Brush teeth thoroughly at bedtime for two minutes. Do not eat or drink for 30 min after use.   topiramate (TOPAMAX) 100 MG tablet Take 1 tablet (100 mg total) by mouth daily.   traZODone (DESYREL) 100 MG tablet Take 1 tablet (100 mg total) by mouth at bedtime.      EKGs/Labs/Other Studies Reviewed:    The following studies were reviewed today:          Recent Labs: 01/03/2023: TSH 1.200 06/13/2023: ALT 26 07/17/2023: BUN 6; Creatinine, Ser 0.83; Hemoglobin 14.3; Magnesium 1.3; Platelets 258; Potassium 2.7; Sodium 142  Recent Lipid Panel    Component Value Date/Time   CHOL 215 (H) 06/13/2023 0905   TRIG 137 06/13/2023 0905   HDL 48 06/13/2023 0905   CHOLHDL  4.5 (H) 06/13/2023 0905   LDLCALC 142 (H) 06/13/2023 0905   EKG Interpretation Date/Time:  Monday July 22 2023 16:22:30 EDT Ventricular Rate:  76 PR Interval:  188 QRS Duration:  90 QT Interval:  386 QTC Calculation: 434 R Axis:   22  Text Interpretation: No findings of pericarditis Normal sinus rhythm Nonspecific T wave abnormality When compared with ECG of 17-Jul-2023 12:00, No significant change was found Confirmed by Norman Herrlich ([id number]) on 07/22/2023 4:24:47 PM   Physical Exam:    VS:  BP (!) 120/90 (BP Location: Right Arm, Patient Position: Sitting, Cuff Size: Normal)   Pulse 80   Ht 5\' 6"  (1.676 m)   Wt 211 lb (95.7 kg)   LMP 01/04/2016 (Approximate)   SpO2 97%   BMI 34.06 kg/m     Wt Readings from Last 3 Encounters:  07/22/23 211 lb (95.7 kg)  07/17/23 210 lb (95.3 kg)  06/13/23 209 lb (94.8 kg)     GEN:  Well nourished, well developed in no acute distress she is wearing a mask and is congested HEENT: Normal NECK: No JVD; No carotid bruits LYMPHATICS: No lymphadenopathy CARDIAC: RRR, no murmurs, rubs, gallops RESPIRATORY:  Clear to auscultation without rales, wheezing or rhonchi  ABDOMEN: Soft, non-tender, non-distended MUSCULOSKELETAL:  No edema; No deformity  SKIN: Warm and dry NEUROLOGIC:  Alert and oriented x 3 PSYCHIATRIC:  Normal affect    Signed, Norman Herrlich, MD  07/22/2023 4:17 PM    [facility]

## 2023-07-22 ENCOUNTER — Other Ambulatory Visit: Payer: Self-pay

## 2023-07-22 ENCOUNTER — Encounter: Payer: Self-pay | Admitting: Cardiology

## 2023-07-22 ENCOUNTER — Ambulatory Visit: Payer: Commercial Managed Care - PPO | Attending: Cardiology | Admitting: Cardiology

## 2023-07-22 VITALS — BP 120/90 | HR 80 | Ht 66.0 in | Wt 211.0 lb

## 2023-07-22 DIAGNOSIS — I700 Atherosclerosis of aorta: Secondary | ICD-10-CM | POA: Diagnosis not present

## 2023-07-22 DIAGNOSIS — R072 Precordial pain: Secondary | ICD-10-CM

## 2023-07-22 DIAGNOSIS — Z8249 Family history of ischemic heart disease and other diseases of the circulatory system: Secondary | ICD-10-CM | POA: Diagnosis not present

## 2023-07-22 DIAGNOSIS — E782 Mixed hyperlipidemia: Secondary | ICD-10-CM | POA: Diagnosis not present

## 2023-07-22 MED ORDER — METOPROLOL TARTRATE 100 MG PO TABS
100.0000 mg | ORAL_TABLET | Freq: Once | ORAL | 0 refills | Status: DC
  Filled 2023-07-22: qty 1, 1d supply, fill #0

## 2023-07-22 MED ORDER — CELECOXIB 100 MG PO CAPS
100.0000 mg | ORAL_CAPSULE | Freq: Two times a day (BID) | ORAL | 0 refills | Status: DC
  Filled 2023-07-22: qty 20, 10d supply, fill #0

## 2023-07-22 MED ORDER — PANTOPRAZOLE SODIUM 40 MG PO TBEC
40.0000 mg | DELAYED_RELEASE_TABLET | Freq: Every day | ORAL | 2 refills | Status: DC
  Filled 2023-07-22: qty 30, 30d supply, fill #0

## 2023-07-22 NOTE — Patient Instructions (Addendum)
Medication Instructions:  Your physician has recommended you make the following change in your medication:   START: Celebrex 100 mg twice daily for 10 days  *If you need a refill on your cardiac medications before your next appointment, please call your pharmacy*   Lab Work: Your physician recommends that you return for lab work in:   Labs today: BMP, Mg, CBC, Sed Rate, CRP  If you have labs (blood work) drawn today and your tests are completely normal, you will receive your results only by: MyChart Message (if you have MyChart) OR A paper copy in the mail If you have any lab test that is abnormal or we need to change your treatment, we will call you to review the results.   Testing/Procedures: Your physician has requested that you have an echocardiogram. Echocardiography is a painless test that uses sound waves to create images of your heart. It provides your doctor with information about the size and shape of your heart and how well your heart's chambers and valves are working. This procedure takes approximately one hour. There are no restrictions for this procedure. Please do NOT wear cologne, perfume, aftershave, or lotions (deodorant is allowed). Please arrive 15 minutes prior to your appointment time.     Your cardiac CT will be scheduled at one of the below locations:   [facility] [address] [place], [place] [address] [phone]  OR  [facility] [address] [place], [place] [address] [phone]  OR   [facility] [address] [place], [place] [address] [phone]  If scheduled at [facility], please arrive at the [facility] and Children's Entrance (Entrance C2) of [facility] 30 minutes prior to test start time. You can use the FREE valet parking offered at entrance C (encouraged to control the heart rate for the test)  Proceed to the [facility]  Radiology Department (first floor) to check-in and test prep.  All radiology patients and guests should use entrance C2 at [facility], accessed from [facility], even though the hospital's physical address listed is [address]    If scheduled at [facility] or [facility], please arrive 15 mins early for check-in and test prep.  There is spacious parking and easy access to the radiology department from the [facility] entrance. Please enter here and check-in with the desk attendant.   Please follow these instructions carefully (unless otherwise directed):  An IV will be required for this test and Nitroglycerin will be given.  Hold all erectile dysfunction medications at least 3 days (72 hrs) prior to test. (Ie viagra, cialis, sildenafil, tadalafil, etc)   On the Night Before the Test: Be sure to Drink plenty of water. Do not consume any caffeinated/decaffeinated beverages or chocolate 12 hours prior to your test. Do not take any antihistamines 12 hours prior to your test.  On the Day of the Test: Drink plenty of water until 1 hour prior to the test. Do not eat any food 1 hour prior to test. You may take your regular medications prior to the test.  Take metoprolol (Lopressor) two hours prior to test. FEMALES- please wear underwire-free bra if available, avoid dresses & tight clothing      After the Test: Drink plenty of water. After receiving IV contrast, you may experience a mild flushed feeling. This is normal. On occasion, you may experience  a mild rash up to 24 hours after the test. This is not dangerous. If this occurs, you can take Benadryl 25 mg and increase your fluid intake. If you experience trouble breathing, this can be serious. If it is severe call 911 IMMEDIATELY. If it is mild, please call our office.  We will call to schedule your test 2-4 weeks out understanding that some  insurance companies will need an authorization prior to the service being performed.   For more information and frequently asked questions, please visit our website : [email or web address]  For non-scheduling related questions, please contact the cardiac imaging nurse navigator should you have any questions/concerns: Cardiac Imaging Nurse Navigators Direct Office Dial: [phone]   For scheduling needs, including cancellations and rescheduling, please call [place], [phone].    Follow-Up: At [facility], you and your health needs are our priority.  As part of our continuing mission to provide you with exceptional heart care, we have created designated Provider Care Teams.  These Care Teams include your primary Cardiologist (physician) and Advanced Practice Providers (APPs -  Physician Assistants and Nurse Practitioners) who all work together to provide you with the care you need, when you need it.  We recommend signing up for the patient portal called "MyChart".  Sign up information is provided on this After Visit Summary.  MyChart is used to connect with patients for Virtual Visits (Telemedicine).  Patients are able to view lab/test results, encounter notes, upcoming appointments, etc.  Non-urgent messages can be sent to your provider as well.   To learn more about what you can do with MyChart, go to [email or web address].    Your next appointment:   3 week(s)  Provider:   Norman Herrlich, MD    Other Instructions None

## 2023-07-22 NOTE — Telephone Encounter (Signed)
Protonix was sent to requested pharmacy. Patient stated she is seeing cardiology today, so she don't need a follow appointment right now but will call if she does.

## 2023-07-23 ENCOUNTER — Other Ambulatory Visit: Payer: Self-pay

## 2023-07-23 MED ORDER — POTASSIUM CHLORIDE ER 10 MEQ PO TBCR
10.0000 meq | EXTENDED_RELEASE_TABLET | Freq: Every day | ORAL | 3 refills | Status: DC
  Filled 2023-07-23: qty 30, 30d supply, fill #0
  Filled 2023-08-26: qty 30, 30d supply, fill #1
  Filled 2023-09-23: qty 30, 30d supply, fill #2
  Filled 2023-10-21: qty 30, 30d supply, fill #3

## 2023-07-25 ENCOUNTER — Encounter (HOSPITAL_COMMUNITY): Payer: Self-pay

## 2023-07-29 ENCOUNTER — Ambulatory Visit (HOSPITAL_COMMUNITY)
Admission: RE | Admit: 2023-07-29 | Discharge: 2023-07-29 | Disposition: A | Discharge status: Home / Self Care | Payer: Commercial Managed Care - PPO | Source: Ambulatory Visit | Attending: Cardiology | Admitting: Cardiology

## 2023-07-29 ENCOUNTER — Encounter (HOSPITAL_COMMUNITY): Payer: Self-pay

## 2023-07-29 DIAGNOSIS — R072 Precordial pain: Secondary | ICD-10-CM | POA: Diagnosis present

## 2023-07-29 MED ORDER — IOHEXOL 350 MG/ML SOLN
95.0000 mL | Freq: Once | INTRAVENOUS | Status: AC | PRN
  Administered 2023-07-29: 95 mL via INTRAVENOUS

## 2023-07-29 MED ORDER — NITROGLYCERIN 0.4 MG SL SUBL
0.8000 mg | SUBLINGUAL_TABLET | Freq: Once | SUBLINGUAL | Status: AC
  Administered 2023-07-29: 0.8 mg via SUBLINGUAL

## 2023-07-29 MED ORDER — NITROGLYCERIN 0.4 MG SL SUBL
SUBLINGUAL_TABLET | SUBLINGUAL | Status: AC
  Filled 2023-07-29: qty 2

## 2023-07-29 NOTE — Progress Notes (Signed)
Patient tolerated CT well. Vital signs stable encourage to drink water throughout day.Reasons explained and verbalized understanding. Ambulated steady gait.   

## 2023-08-06 ENCOUNTER — Ambulatory Visit: Payer: Commercial Managed Care - PPO | Admitting: Family Medicine

## 2023-08-08 ENCOUNTER — Other Ambulatory Visit: Payer: Self-pay | Admitting: Cardiology

## 2023-08-08 ENCOUNTER — Ambulatory Visit (HOSPITAL_BASED_OUTPATIENT_CLINIC_OR_DEPARTMENT_OTHER)
Admission: RE | Admit: 2023-08-08 | Discharge: 2023-08-08 | Disposition: A | Discharge status: Home / Self Care | Payer: Commercial Managed Care - PPO | Source: Ambulatory Visit | Attending: Cardiology | Admitting: Cardiology

## 2023-08-08 DIAGNOSIS — Z8249 Family history of ischemic heart disease and other diseases of the circulatory system: Secondary | ICD-10-CM | POA: Diagnosis not present

## 2023-08-08 DIAGNOSIS — I700 Atherosclerosis of aorta: Secondary | ICD-10-CM

## 2023-08-08 DIAGNOSIS — R072 Precordial pain: Secondary | ICD-10-CM

## 2023-08-08 DIAGNOSIS — I5030 Unspecified diastolic (congestive) heart failure: Secondary | ICD-10-CM | POA: Diagnosis not present

## 2023-08-08 DIAGNOSIS — E782 Mixed hyperlipidemia: Secondary | ICD-10-CM

## 2023-08-09 ENCOUNTER — Other Ambulatory Visit: Payer: Self-pay

## 2023-08-09 LAB — ECHOCARDIOGRAM COMPLETE
AR max vel: 2.19 cm2
AV Area VTI: 2.18 cm2
AV Area mean vel: 2.31 cm2
AV Mean grad: 3 mmHg
AV Peak grad: 6.5 mmHg
Ao pk vel: 1.27 m/s
Area-P 1/2: 3.77 cm2
Calc EF: 63.6 %
S' Lateral: 2.5 cm
Single Plane A2C EF: 67.3 %
Single Plane A4C EF: 60.7 %

## 2023-08-11 NOTE — Progress Notes (Unsigned)
Cardiology Office Note:    Date:  08/12/2023   ID:  Nichole Gonzalez, DOB 03/20/1976, MRN [id number]  PCP:  Blane Ohara, MD  Cardiologist:  Norman Herrlich, MD    Referring MD: Blane Ohara, MD    ASSESSMENT:    1. Precordial pain   2. Mild CAD   3. Mixed hyperlipidemia    PLAN:    In order of problems listed above:  Clinically she had pleuropericarditis that quickly resolved no recurrence off nonsteroidal anti-inflammatory drug no evidence of pericardial scarring or effusion on echocardiogram She has was probably best described as minimal coronary atherosclerosis her coronary calcium score is low but percentagewise is high at her age and I think that statin therapy is indicated certainly is her decision and I will have her come in 1 month recheck lipids and LP(a) screen   Next appointment: As needed   Medication Adjustments/Labs and Tests Ordered: Current medicines are reviewed at length with the patient today.  Concerns regarding medicines are outlined above.  No orders of the defined types were placed in this encounter.  No orders of the defined types were placed in this encounter.    History of Present Illness:    Nichole Gonzalez is a 47 y.o. female with a hx of aortic atherosclerosis hyperlipidemia and family history of premature CAD last seen 07/22/2023 in consultation for evaluation of chest pain.  Suspect of having pericardial and pleuritic chest pain there is C-reactive protein was elevated she was treated with a nonsteroidal anti-inflammatory drug.  Cardiac CTA reported 07/28/2020 with a calcium score of 1.37 88th percentile for age sex and race matched control plaque volume 64th percentile minimal less than 25% calcified plaque in the left circumflex and distal right coronary artery.  There is no pericardial effusion or thickening present.  Her echocardiogram from 08/08/2023 showed normal left ventricular size wall thickness systolic function EF 60 to 65%  grade 1 diastolic filling and no valvular abnormality.  The pericardium was normal.  Compliance with diet, lifestyle and medications: Yes  Her pleuropericardial pain quickly resolved with Celebrex and she has had no recurrent chest pain She is not taking a statin which is well-tolerated 1 month I will have her come the office we will do a lipid profile and screen for LP little a Plan to follow-up with me in the future as needed 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   Primary insomnia 09/08/2020   Tobacco abuse 09/03/2015    Current Medications: Current Meds  Medication Sig   Ascorbic Acid (VITAMIN C PO) Take by mouth.   atorvastatin (LIPITOR) 10 MG tablet Take 1 tablet (10 mg total) by mouth daily.   buPROPion (WELLBUTRIN XL) 150 MG 24 hr tablet Take 3 tablets (450 mg total) by mouth daily.   celecoxib (CELEBREX) 100 MG capsule Take 1 capsule (100 mg total) by mouth 2 (two) times daily.   cetirizine (ZYRTEC ALLERGY) 10 MG tablet Take 1 tablet (10 mg total) by mouth daily.   citalopram (CELEXA) 40 MG tablet Take 1 tablet (40 mg total) by mouth daily.   cyclobenzaprine (FLEXERIL) 10 MG tablet Take 1 tablet (10 mg total) by mouth at bedtime.   EPIPEN 2-PAK 0.3 MG/0.3ML SOAJ injection  fluticasone (FLONASE) 50 MCG/ACT nasal spray Place 1 spray into both nostrils daily.   hydrOXYzine (VISTARIL) 25 MG capsule Take 1 capsule (25 mg total) by mouth every 8 (eight) hours as needed.   LORazepam (ATIVAN) 0.5 MG tablet Take 1 tablet (0.5 mg total) by mouth 2 (two) times daily as needed (panic attacks).   pantoprazole (PROTONIX) 40 MG tablet Take 1 tablet (40 mg total) by mouth daily.   potassium chloride  (KLOR-CON) 10 MEQ tablet Take 1 tablet (10 mEq total) by mouth daily.   sodium fluoride (PREVIDENT 5000 DRY MOUTH) 1.1 % GEL dental gel Apply a thin ribbon to toothbrush. Brush teeth thoroughly at bedtime for two minutes. Do not eat or drink for 30 min after use.   topiramate (TOPAMAX) 100 MG tablet Take 1 tablet (100 mg total) by mouth daily.   traZODone (DESYREL) 100 MG tablet Take 1 tablet (100 mg total) by mouth at bedtime.      EKGs/Labs/Other Studies Reviewed:    The following studies were reviewed today:         Recent Labs: 01/03/2023: TSH 1.200 06/13/2023: ALT 26 07/22/2023: BUN 5; Creatinine, Ser 0.77; Hemoglobin 14.0; Magnesium 1.7; Platelets 245; Potassium 3.5; Sodium 144  Recent Lipid Panel    Component Value Date/Time   CHOL 215 (H) 06/13/2023 0905   TRIG 137 06/13/2023 0905   HDL 48 06/13/2023 0905   CHOLHDL 4.5 (H) 06/13/2023 0905   LDLCALC 142 (H) 06/13/2023 0905    Physical Exam:    VS:  BP 118/80 (BP Location: Right Arm, Patient Position: Sitting, Cuff Size: Normal)   Pulse 80   Ht 5\' 6"  (1.676 m)   Wt 210 lb 6.4 oz (95.4 kg)   LMP 01/04/2016 (Approximate)   SpO2 99%   BMI 33.96 kg/m     Wt Readings from Last 3 Encounters:  08/12/23 210 lb 6.4 oz (95.4 kg)  07/22/23 211 lb (95.7 kg)  07/17/23 210 lb (95.3 kg)     GEN:  Well nourished, well developed in no acute distress HEENT: Normal NECK: No JVD; No carotid bruits LYMPHATICS: No lymphadenopathy CARDIAC: RRR, no murmurs, rubs, gallops she has no pleural pericardial rub RESPIRATORY:  Clear to auscultation without rales, wheezing or rhonchi  ABDOMEN: Soft, non-tender, non-distended MUSCULOSKELETAL:  No edema; No deformity  SKIN: Warm and dry NEUROLOGIC:  Alert and oriented x 3 PSYCHIATRIC:  Normal affect    Signed, Norman Herrlich, MD  08/12/2023 2:56 PM    [facility]

## 2023-08-12 ENCOUNTER — Ambulatory Visit: Payer: Commercial Managed Care - PPO | Attending: Cardiology | Admitting: Cardiology

## 2023-08-12 ENCOUNTER — Encounter: Payer: Self-pay | Admitting: Cardiology

## 2023-08-12 VITALS — BP 118/80 | HR 80 | Ht 66.0 in | Wt 210.4 lb

## 2023-08-12 DIAGNOSIS — R072 Precordial pain: Secondary | ICD-10-CM | POA: Diagnosis not present

## 2023-08-12 DIAGNOSIS — I2510 Atherosclerotic heart disease of native coronary artery without angina pectoris: Secondary | ICD-10-CM

## 2023-08-12 DIAGNOSIS — E782 Mixed hyperlipidemia: Secondary | ICD-10-CM

## 2023-08-12 NOTE — Addendum Note (Signed)
Addended by: Roxanne Mins I on: 08/12/2023 03:20 PM   Modules accepted: Orders

## 2023-08-12 NOTE — Patient Instructions (Signed)
Medication Instructions:  Your physician recommends that you continue on your current medications as directed. Please refer to the Current Medication list given to you today.  *If you need a refill on your cardiac medications before your next appointment, please call your pharmacy*   Lab Work: Your physician recommends that you return for lab work in:   Labs in 1 month: Lipid, Lpa  If you have labs (blood work) drawn today and your tests are completely normal, you will receive your results only by: MyChart Message (if you have MyChart) OR A paper copy in the mail If you have any lab test that is abnormal or we need to change your treatment, we will call you to review the results.   Testing/Procedures: None   Follow-Up: At [facility], you and your health needs are our priority.  As part of our continuing mission to provide you with exceptional heart care, we have created designated Provider Care Teams.  These Care Teams include your primary Cardiologist (physician) and Advanced Practice Providers (APPs -  Physician Assistants and Nurse Practitioners) who all work together to provide you with the care you need, when you need it.  We recommend signing up for the patient portal called "MyChart".  Sign up information is provided on this After Visit Summary.  MyChart is used to connect with patients for Virtual Visits (Telemedicine).  Patients are able to view lab/test results, encounter notes, upcoming appointments, etc.  Non-urgent messages can be sent to your provider as well.   To learn more about what you can do with MyChart, go to [email or web address].    Your next appointment:   Follow up as needed   Provider:   Norman Herrlich, MD    Other Instructions None

## 2023-08-15 ENCOUNTER — Encounter: Payer: Self-pay | Admitting: Family Medicine

## 2023-08-26 ENCOUNTER — Other Ambulatory Visit: Payer: Self-pay | Admitting: Family Medicine

## 2023-08-26 ENCOUNTER — Other Ambulatory Visit: Payer: Self-pay

## 2023-08-26 ENCOUNTER — Encounter: Payer: Self-pay | Admitting: Family Medicine

## 2023-08-27 ENCOUNTER — Other Ambulatory Visit: Payer: Self-pay

## 2023-08-27 ENCOUNTER — Other Ambulatory Visit: Payer: Self-pay | Admitting: Family Medicine

## 2023-08-27 MED ORDER — TOPIRAMATE 100 MG PO TABS
100.0000 mg | ORAL_TABLET | Freq: Every day | ORAL | 0 refills | Status: DC
  Filled 2023-08-27: qty 90, 90d supply, fill #0

## 2023-08-27 MED ORDER — OMEPRAZOLE 40 MG PO CPDR
40.0000 mg | DELAYED_RELEASE_CAPSULE | Freq: Every day | ORAL | 0 refills | Status: DC
  Filled 2023-08-27: qty 90, 90d supply, fill #0

## 2023-09-09 ENCOUNTER — Encounter: Payer: Self-pay | Admitting: Cardiology

## 2023-09-11 ENCOUNTER — Other Ambulatory Visit: Payer: Self-pay

## 2023-09-11 ENCOUNTER — Other Ambulatory Visit: Payer: Self-pay | Admitting: Family Medicine

## 2023-09-11 DIAGNOSIS — M2559 Pain in other specified joint: Secondary | ICD-10-CM

## 2023-09-11 MED ORDER — CYCLOBENZAPRINE HCL 10 MG PO TABS
10.0000 mg | ORAL_TABLET | Freq: Every day | ORAL | 0 refills | Status: DC
Start: 2023-09-11 — End: 2024-02-04
  Filled 2023-09-19: qty 90, 90d supply, fill #0

## 2023-09-11 MED ORDER — ATORVASTATIN CALCIUM 10 MG PO TABS
10.0000 mg | ORAL_TABLET | Freq: Every day | ORAL | 2 refills | Status: DC
  Filled 2023-09-19: qty 90, 90d supply, fill #0

## 2023-09-12 ENCOUNTER — Other Ambulatory Visit: Payer: Self-pay

## 2023-09-12 ENCOUNTER — Other Ambulatory Visit: Payer: Self-pay | Admitting: Family Medicine

## 2023-09-12 DIAGNOSIS — R21 Rash and other nonspecific skin eruption: Secondary | ICD-10-CM

## 2023-09-13 ENCOUNTER — Other Ambulatory Visit: Payer: Self-pay

## 2023-09-13 ENCOUNTER — Encounter: Payer: Self-pay | Admitting: Family Medicine

## 2023-09-13 MED FILL — Hydroxyzine Pamoate Cap 25 MG: ORAL | 30 days supply | Qty: 90 | Fill #0 | Status: AC

## 2023-09-14 ENCOUNTER — Other Ambulatory Visit: Payer: Self-pay | Admitting: Family Medicine

## 2023-09-14 DIAGNOSIS — F332 Major depressive disorder, recurrent severe without psychotic features: Secondary | ICD-10-CM

## 2023-09-14 DIAGNOSIS — F5101 Primary insomnia: Secondary | ICD-10-CM

## 2023-09-14 MED ORDER — TRAZODONE HCL 100 MG PO TABS
100.0000 mg | ORAL_TABLET | Freq: Every day | ORAL | 0 refills | Status: DC
  Filled 2023-09-14: qty 90, 90d supply, fill #0

## 2023-09-14 MED ORDER — BUPROPION HCL ER (XL) 150 MG PO TB24
450.0000 mg | ORAL_TABLET | Freq: Every day | ORAL | 0 refills | Status: DC
Start: 2023-09-14 — End: 2023-12-12
  Filled 2023-09-14: qty 270, 90d supply, fill #0

## 2023-09-15 ENCOUNTER — Other Ambulatory Visit: Payer: Self-pay

## 2023-09-16 ENCOUNTER — Other Ambulatory Visit: Payer: Self-pay

## 2023-09-19 ENCOUNTER — Other Ambulatory Visit: Payer: Self-pay

## 2023-09-23 ENCOUNTER — Telehealth: Payer: Commercial Managed Care - PPO | Admitting: Emergency Medicine

## 2023-09-23 ENCOUNTER — Other Ambulatory Visit: Payer: Self-pay

## 2023-09-23 ENCOUNTER — Telehealth: Payer: Self-pay | Admitting: Oncology

## 2023-09-23 ENCOUNTER — Encounter: Payer: Self-pay | Admitting: Oncology

## 2023-09-23 DIAGNOSIS — R059 Cough, unspecified: Secondary | ICD-10-CM | POA: Diagnosis not present

## 2023-09-23 MED ORDER — BENZONATATE 100 MG PO CAPS
100.0000 mg | ORAL_CAPSULE | Freq: Two times a day (BID) | ORAL | 0 refills | Status: DC | PRN
  Filled 2023-09-23: qty 20, 10d supply, fill #0

## 2023-09-23 MED ORDER — AZITHROMYCIN 250 MG PO TABS
ORAL_TABLET | ORAL | 0 refills | Status: AC
  Filled 2023-09-23: qty 6, 5d supply, fill #0

## 2023-09-23 NOTE — Progress Notes (Signed)
E-Visit for Cough  We are sorry that you are not feeling well.  Here is how we plan to help!  Based on your presentation I believe you most likely have A cough due to bacteria.  When patients have a fever and a productive cough with a change in color or increased sputum production, we are concerned about bacterial bronchitis.  If left untreated it can progress to pneumonia.  If your symptoms do not improve with your treatment plan it is important that you contact your provider.   I have prescribed Azithromyin 250 mg: two tablets now and then one tablet daily for 4 additonal days    In addition you may use A prescription cough medication called Tessalon Perles 100mg . You may take 1-2 capsules every 8 hours as needed for your cough.    From your responses in the eVisit questionnaire you describe inflammation in the upper respiratory tract which is causing a significant cough.  This is commonly called Bronchitis and has four common causes:   Allergies Viral Infections Acid Reflux Bacterial Infection Allergies, viruses and acid reflux are treated by controlling symptoms or eliminating the cause. An example might be a cough caused by taking certain blood pressure medications. You stop the cough by changing the medication. Another example might be a cough caused by acid reflux. Controlling the reflux helps control the cough.   HOME CARE Only take medications as instructed by your medical team. Complete the entire course of an antibiotic. Drink plenty of fluids and get plenty of rest. Avoid close contacts especially the very young and the elderly Cover your mouth if you cough or cough into your sleeve. Always remember to wash your hands A steam or ultrasonic humidifier can help congestion.   GET HELP RIGHT AWAY IF: You develop worsening fever. You become short of breath You cough up blood. Your symptoms persist after you have completed your treatment plan MAKE SURE YOU  Understand these  instructions. Will watch your condition. Will get help right away if you are not doing well or get worse.    Thank you for choosing an e-visit.  Your e-visit answers were reviewed by a board certified advanced clinical practitioner to complete your personal care plan. Depending upon the condition, your plan could have included both over the counter or prescription medications.  Please review your pharmacy choice. Make sure the pharmacy is open so you can pick up prescription now. If there is a problem, you may contact your provider through [employer] and have the prescription routed to another pharmacy.  Your safety is important to [place]. If you have drug allergies check your prescription carefully.   For the next 24 hours you can use MyChart to ask questions about today's visit, request a non-urgent call back, or ask for a work or school excuse. You will get an email in the next two days asking about your experience. I hope that your e-visit has been valuable and will speed your recovery.   Approximately 5 minutes was used in reviewing the patient's chart, questionnaire, prescribing medications, and documentation.

## 2023-09-23 NOTE — Telephone Encounter (Signed)
09/23/23 Spoke with patient and rescheduled appts.

## 2023-09-24 ENCOUNTER — Inpatient Hospital Stay: Payer: Commercial Managed Care - PPO | Admitting: Oncology

## 2023-09-24 ENCOUNTER — Inpatient Hospital Stay: Payer: Commercial Managed Care - PPO

## 2023-09-26 ENCOUNTER — Encounter: Payer: Self-pay | Admitting: Physician Assistant

## 2023-09-26 ENCOUNTER — Other Ambulatory Visit: Payer: Self-pay

## 2023-09-26 ENCOUNTER — Ambulatory Visit: Payer: Commercial Managed Care - PPO | Admitting: Physician Assistant

## 2023-09-26 VITALS — BP 120/76 | HR 60 | Temp 97.2°F | Ht 66.0 in | Wt 212.0 lb

## 2023-09-26 DIAGNOSIS — R059 Cough, unspecified: Secondary | ICD-10-CM | POA: Diagnosis not present

## 2023-09-26 DIAGNOSIS — R053 Chronic cough: Secondary | ICD-10-CM | POA: Insufficient documentation

## 2023-09-26 MED ORDER — PREDNISONE 20 MG PO TABS
ORAL_TABLET | ORAL | 0 refills | Status: DC
Start: 2023-09-26 — End: 2023-10-04
  Filled 2023-09-26: qty 18, 9d supply, fill #0

## 2023-09-26 MED ORDER — DOXYCYCLINE HYCLATE 100 MG PO TABS
100.0000 mg | ORAL_TABLET | Freq: Two times a day (BID) | ORAL | 0 refills | Status: DC
Start: 2023-09-26 — End: 2023-10-04
  Filled 2023-09-26: qty 20, 10d supply, fill #0

## 2023-09-26 NOTE — Progress Notes (Signed)
Acute Office Visit  Subjective:    Patient ID: Nichole Gonzalez, female    DOB: September 22, 1976, 47 y.o.   MRN: [id number]  Chief Complaint  Patient presents with   Cough    HPI: Patient is in today for a dry and productive cough x 3 weeks. States she had covid 3 weeks ago. Sometimes will cough so much she feels like she is going to gage/throw up. Had e visit on Monday and was rx'd z-pak and tessalon pearls which are not helping. Head hurts from coughing. Denies any fevers, Admits to coughing so severely that she has gagged and even thrown up. Has been taking the Tesselon pearls to see if it would help and it has not helped. States that her mom also has a cough.  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   Primary insomnia 09/08/2020   Tobacco abuse 09/03/2015    Past Surgical History:  Procedure Laterality Date   ABDOMINAL HYSTERECTOMY     APPENDECTOMY     CHOLECYSTECTOMY     DILATION AND CURETTAGE, DIAGNOSTIC / THERAPEUTIC     JOINT REPLACEMENT     KNEE ARTHROSCOPY     right foot     TUBAL LIGATION      Family History  Problem Relation Age of Onset   Thyroid disease Sister    Coronary artery disease Father    Heart attack Father    COPD Sister    Diabetes Mellitus II Sister     Social History   Socioeconomic History   Marital status: Married    Spouse name: Not on file   Number of children: 3   Years of education: Not on file   Highest education level: Bachelor's degree (e.g., BA, AB, BS)  Occupational History   Not on file  Tobacco Use   Smoking status: Former    Current packs/day: 1.00    Average packs/day: 1 pack/day for 30.0 years (30.0 ttl pk-yrs)    Types: Cigarettes     Passive exposure: Never   Smokeless tobacco: Never  Vaping Use   Vaping status: Never Used  Substance and Sexual Activity   Alcohol use: Yes    Alcohol/week: 8.0 standard drinks of alcohol    Types: 8 Standard drinks or equivalent per week    Comment: socially   Drug use: Never   Sexual activity: Not on file  Other Topics Concern   Not on file  Social History Narrative   ** Merged History Encounter **       Social Determinants of Health   Financial Resource Strain: Low Risk  (06/12/2023)   Overall Financial Resource Strain (CARDIA)    Difficulty of Paying Living Expenses: Not very hard  Food Insecurity: Unknown (06/12/2023)   Hunger Vital Sign    Worried About Running Out of Food in the Last Year: Never true    Ran Out of Food in the Last Year: Not on file  Transportation Needs: No Transportation Needs (06/12/2023)   PRAPARE - Administrator, Civil Service (Medical): No    Lack of Transportation (Non-Medical): No  Physical Activity: Insufficiently Active (06/12/2023)   Exercise Vital Sign    Days of Exercise per Week: 3 days  Minutes of Exercise per Session: 30 min  Stress: Stress Concern Present (06/12/2023)   [employer] of Occupational Health - Occupational Stress Questionnaire    Feeling of Stress : Very much  Social Connections: Moderately Isolated (06/12/2023)   Social Connection and Isolation Panel [NHANES]    Frequency of Communication with Friends and Family: Never    Frequency of Social Gatherings with Friends and Family: More than three times a week    Attends Religious Services: Never    Database administrator or Organizations: No    Attends Engineer, structural: Not on file    Marital Status: Married  Catering manager Violence: Not At Risk (09/27/2022)   Humiliation, Afraid, Rape, and Kick questionnaire    Fear of Current or Ex-Partner: No    Emotionally Abused: No    Physically Abused: No    Sexually Abused: No    Outpatient  Medications Prior to Visit  Medication Sig Dispense Refill   Ascorbic Acid (VITAMIN C PO) Take by mouth.     atorvastatin (LIPITOR) 10 MG tablet Take 1 tablet (10 mg total) by mouth daily. 30 tablet 2   azithromycin (ZITHROMAX) 250 MG tablet Take 2 tablets (500 mg total) by mouth daily for 1 day, THEN 1 tablet (250 mg total) daily for 4 days. 6 tablet 0   benzonatate (TESSALON) 100 MG capsule Take 1 capsule (100 mg total) by mouth 2 (two) times daily as needed for cough. 20 capsule 0   buPROPion (WELLBUTRIN XL) 150 MG 24 hr tablet Take 3 tablets (450 mg total) by mouth daily. 270 tablet 0   celecoxib (CELEBREX) 100 MG capsule Take 1 capsule (100 mg total) by mouth 2 (two) times daily. 20 capsule 0   cetirizine (ZYRTEC ALLERGY) 10 MG tablet Take 1 tablet (10 mg total) by mouth daily. 90 tablet 3   citalopram (CELEXA) 40 MG tablet Take 1 tablet (40 mg total) by mouth daily. 90 tablet 1   cyclobenzaprine (FLEXERIL) 10 MG tablet Take 1 tablet (10 mg total) by mouth at bedtime. 90 tablet 0   EPIPEN 2-PAK 0.3 MG/0.3ML SOAJ injection   0   fluticasone (FLONASE) 50 MCG/ACT nasal spray Place 1 spray into both nostrils daily. 16 g 0   hydrOXYzine (VISTARIL) 25 MG capsule Take 1 capsule (25 mg total) by mouth every 8 (eight) hours as needed. 90 capsule 0   LORazepam (ATIVAN) 0.5 MG tablet Take 1 tablet (0.5 mg total) by mouth 2 (two) times daily as needed (panic attacks). 30 tablet 1   omeprazole (PRILOSEC) 40 MG capsule Take 1 capsule (40 mg total) by mouth daily. 90 capsule 0   potassium chloride (KLOR-CON) 10 MEQ tablet Take 1 tablet (10 mEq total) by mouth daily. 30 tablet 3   sodium fluoride (PREVIDENT 5000 DRY MOUTH) 1.1 % GEL dental gel Apply a thin ribbon to toothbrush. Brush teeth thoroughly at bedtime for two minutes. Do not eat or drink for 30 min after use. 56 g 3   topiramate (TOPAMAX) 100 MG tablet Take 1 tablet (100 mg total) by mouth daily. 90 tablet 0   traZODone (DESYREL) 100 MG tablet Take  1 tablet (100 mg total) by mouth at bedtime. 90 tablet 0   No facility-administered medications prior to visit.    Allergies  Allergen Reactions   Morphine And Codeine Anaphylaxis   Bee Pollen     Anaphylaxis.    Codeine Other (See Comments)    Fainting.   Rosuvastatin  GI upset    Review of Systems  Constitutional:  Positive for fatigue. Negative for chills and fever.  HENT:  Positive for rhinorrhea. Negative for congestion, ear pain, postnasal drip, sinus pressure, sinus pain and sore throat.   Respiratory:  Positive for cough. Negative for shortness of breath.   Cardiovascular:  Negative for chest pain.  Gastrointestinal:  Negative for diarrhea and nausea.  Neurological:  Negative for dizziness and headaches.       Objective:        09/26/2023    9:07 AM 08/12/2023    2:36 PM 07/29/2023    8:44 AM  Vitals with BMI  Height 5\' 6"  5\' 6"    Weight 212 lbs 210 lbs 6 oz   BMI 34.23 33.98   Systolic 120 118 284  Diastolic 76 80 80  Pulse 60 80 58    No data found.   Physical Exam Vitals reviewed.  Constitutional:      Appearance: Normal appearance.  Cardiovascular:     Rate and Rhythm: Normal rate and regular rhythm.     Heart sounds: Normal heart sounds.  Pulmonary:     Effort: Pulmonary effort is normal.     Breath sounds: Examination of the right-middle field reveals wheezing. Examination of the right-lower field reveals wheezing. Wheezing present. No rhonchi or rales.  Abdominal:     General: Bowel sounds are normal.     Palpations: Abdomen is soft.     Tenderness: There is no abdominal tenderness.  Neurological:     Mental Status: She is alert and oriented to person, place, and time.  Psychiatric:        Mood and Affect: Mood normal.        Behavior: Behavior normal.     Health Maintenance Due  Topic Date Due   INFLUENZA VACCINE  07/25/2023    There are no preventive care reminders to display for this patient.   Lab Results  Component Value  Date   TSH 1.200 01/03/2023   Lab Results  Component Value Date   WBC 7.3 07/22/2023   HGB 14.0 07/22/2023   HCT 42.0 07/22/2023   MCV 95 07/22/2023   PLT 245 07/22/2023   Lab Results  Component Value Date   NA 144 07/22/2023   K 3.5 07/22/2023   CO2 24 07/22/2023   GLUCOSE 91 07/22/2023   BUN 5 (L) 07/22/2023   CREATININE 0.77 07/22/2023   BILITOT 0.3 06/13/2023   ALKPHOS 97 06/13/2023   AST 22 06/13/2023   ALT 26 06/13/2023   PROT 6.3 06/13/2023   ALBUMIN 4.1 06/13/2023   CALCIUM 8.9 07/22/2023   ANIONGAP 10 07/17/2023   EGFR 95 07/22/2023   Lab Results  Component Value Date   CHOL 215 (H) 06/13/2023   Lab Results  Component Value Date   HDL 48 06/13/2023   Lab Results  Component Value Date   LDLCALC 142 (H) 06/13/2023   Lab Results  Component Value Date   TRIG 137 06/13/2023   Lab Results  Component Value Date   CHOLHDL 4.5 (H) 06/13/2023   Lab Results  Component Value Date   HGBA1C 5.6 02/26/2023       Assessment & Plan:  Cough, unspecified type Assessment & Plan: Prescribed Doxycyline 100mg , Prednisone taper Sent for chest x-ray to rule out pneumonia Will continue to monitor symptoms  Orders: -     POC COVID-19 BinaxNow -     POCT Influenza A/B -  DG Chest 2 View; Future -     predniSONE; Take 3 tablets (60 mg total) by mouth daily with breakfast for 3 days, THEN 2 tablets (40 mg total) daily with breakfast for 3 days, THEN 1 tablet (20 mg total) daily with breakfast for 3 days.  Dispense: 18 tablet; Refill: 0 -     Doxycycline Hyclate; Take 1 tablet (100 mg total) by mouth 2 (two) times daily.  Dispense: 20 tablet; Refill: 0     Meds ordered this encounter  Medications   predniSONE (DELTASONE) 20 MG tablet    Sig: Take 3 tablets (60 mg total) by mouth daily with breakfast for 3 days, THEN 2 tablets (40 mg total) daily with breakfast for 3 days, THEN 1 tablet (20 mg total) daily with breakfast for 3 days.    Dispense:  18 tablet     Refill:  0   doxycycline (VIBRA-TABS) 100 MG tablet    Sig: Take 1 tablet (100 mg total) by mouth 2 (two) times daily.    Dispense:  20 tablet    Refill:  0    Orders Placed This Encounter  Procedures   DG Chest 2 View   POC COVID-19 BinaxNow   POCT Influenza A/B     Follow-up: Return if symptoms worsen or fail to improve.  An After Visit Summary was printed and given to the patient.  Langley Gauss, [facility] [phone]

## 2023-09-26 NOTE — Assessment & Plan Note (Signed)
Prescribed Doxycyline 100mg , Prednisone taper Sent for chest x-ray to rule out pneumonia Will continue to monitor symptoms

## 2023-09-30 LAB — POC COVID19 BINAXNOW: SARS Coronavirus 2 Ag: NEGATIVE

## 2023-09-30 LAB — POCT INFLUENZA A/B
Influenza A, POC: NEGATIVE
Influenza B, POC: NEGATIVE

## 2023-10-03 NOTE — Assessment & Plan Note (Addendum)
The current medical regimen is effective;  continue present plan and medications. Continue omeprazole 40 mg daily.  

## 2023-10-03 NOTE — Assessment & Plan Note (Addendum)
The current medical regimen is effective;  continue present plan and medications. Wellbutrin 150 mg 3 in am, trazodone 100 mg at bedtime and Celexa 40 mg once daily . Ativan and Vistaril as needed. Rarely needs/takes lorazepam or vistaril.

## 2023-10-03 NOTE — Assessment & Plan Note (Signed)
Well controlled.  No medicines.  Continue to work on eating a healthy diet and exercise.  Labs drawn today.   

## 2023-10-03 NOTE — Progress Notes (Unsigned)
Subjective:  Patient ID: Nichole Gonzalez, female    DOB: [birth year]  Age: 47 y.o. MRN: [id number]  Chief Complaint  Patient presents with   Medical Management of Chronic Issues    HPI   Gastroesophageal reflux disease, unspecified whether esophagitis present Omeprazole 40 mg daily. Controlled.   Mixed hyperlipidemia Was on Atorvastatin 10 mg daily. Decreased appetite. Eats breakfast and lunch. Eats dinner sometimes.  Depression, major, recurrent, mild  Wellbutrin 150 mg 3 in am, trazodone 100 mg at bedtime and Celexa 40 mg once daily . Ativan and Vistaril as needed. Rarely needs/takes lorazepam or vistaril.   IBS-D/C: takes dicyclomine 20 mg once daily in am.   Neck Pain: taking celebrex 100 mg once at night and tylenol. Helps some.  Knee pain: BL. Had xrays of knees.  Arthoscopic surgery on right knee in her 10s.  Ankle pain: BL. No xrays done in a good while. Extensive surgery on right ankle  Migraines: On topamax 100 mg daily. Neck still causes some headaches.   Lynnda Child, [facility] for persistent cough. Given doxycycline and prednisone. Sent for cxr. No pneumonia. Greatly improved.      10/04/2023    8:35 AM 06/13/2023    8:37 AM 02/26/2023    8:32 AM 01/03/2023    7:41 AM 01/03/2023    7:40 AM  Depression screen PHQ 2/9  Decreased Interest 1 1 2  0 0  Down, Depressed, Hopeless 0 3 2 0 0  PHQ - 2 Score 1 4 4  0 0  Altered sleeping 0 1 3 1    Tired, decreased energy 1 1 3 2    Change in appetite 1 3 0 1   Feeling bad or failure about yourself  0 3 0 0   Trouble concentrating 0 3 3 1    Moving slowly or fidgety/restless 0 2 0 0   Suicidal thoughts 0 0 0 0   PHQ-9 Score 3 17 13 5    Difficult doing work/chores Not difficult at all Very difficult Somewhat difficult Somewhat difficult          10/04/2023    8:35 AM 06/13/2023    8:38 AM 05/19/2021   11:21 AM  GAD 7 : Generalized Anxiety Score  Nervous, Anxious, on Edge 0 2 1  Control/stop worrying 1 3 3   Worry too much  - different things 1 3 3   Trouble relaxing 0 3 2  Restless 0 2 2  Easily annoyed or irritable 0 2 3  Afraid - awful might happen 0 3 0  Total GAD 7 Score 2 18 14   Anxiety Difficulty Not difficult at all Somewhat difficult Very difficult        10/04/2023    8:35 AM  Fall Risk   Falls in the past year? 0  Number falls in past yr: 0  Injury with Fall? 0  Risk for fall due to : No Fall Risks  Follow up Falls evaluation completed    Patient Care Team: Blane Ohara, MD as PCP - General (Family Medicine)   Review of Systems  Constitutional:  Negative for chills, fatigue and fever.  HENT:  Negative for congestion, ear pain, rhinorrhea and sore throat.   Respiratory:  Negative for cough and shortness of breath.   Cardiovascular:  Negative for chest pain.  Gastrointestinal:  Negative for abdominal pain, constipation, diarrhea, nausea and vomiting.  Genitourinary:  Negative for dysuria and urgency.  Musculoskeletal:  Positive for arthralgias (knees, ankles.) and back pain. Negative for myalgias.  Neurological:  Positive for headaches. Negative for dizziness, weakness and light-headedness.  Psychiatric/Behavioral:  Negative for dysphoric mood. The patient is not nervous/anxious.     Current Outpatient Medications on File Prior to Visit  Medication Sig Dispense Refill   Ascorbic Acid (VITAMIN C PO) Take by mouth.     atorvastatin (LIPITOR) 10 MG tablet Take 1 tablet (10 mg total) by mouth daily. 30 tablet 2   buPROPion (WELLBUTRIN XL) 150 MG 24 hr tablet Take 3 tablets (450 mg total) by mouth daily. 270 tablet 0   celecoxib (CELEBREX) 100 MG capsule Take 1 capsule (100 mg total) by mouth 2 (two) times daily. 20 capsule 0   cetirizine (ZYRTEC ALLERGY) 10 MG tablet Take 1 tablet (10 mg total) by mouth daily. 90 tablet 3   citalopram (CELEXA) 40 MG tablet Take 1 tablet (40 mg total) by mouth daily. 90 tablet 1   cyclobenzaprine (FLEXERIL) 10 MG tablet Take 1 tablet (10 mg total) by mouth  at bedtime. 90 tablet 0   EPIPEN 2-PAK 0.3 MG/0.3ML SOAJ injection   0   hydrOXYzine (VISTARIL) 25 MG capsule Take 1 capsule (25 mg total) by mouth every 8 (eight) hours as needed. 90 capsule 0   LORazepam (ATIVAN) 0.5 MG tablet Take 1 tablet (0.5 mg total) by mouth 2 (two) times daily as needed (panic attacks). 30 tablet 1   omeprazole (PRILOSEC) 40 MG capsule Take 1 capsule (40 mg total) by mouth daily. 90 capsule 0   potassium chloride (KLOR-CON) 10 MEQ tablet Take 1 tablet (10 mEq total) by mouth daily. 30 tablet 3   sodium fluoride (PREVIDENT 5000 DRY MOUTH) 1.1 % GEL dental gel Apply a thin ribbon to toothbrush. Brush teeth thoroughly at bedtime for two minutes. Do not eat or drink for 30 min after use. 56 g 3   topiramate (TOPAMAX) 100 MG tablet Take 1 tablet (100 mg total) by mouth daily. 90 tablet 0   traZODone (DESYREL) 100 MG tablet Take 1 tablet (100 mg total) by mouth at bedtime. 90 tablet 0   No current facility-administered medications on file prior to visit.   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   Primary insomnia 09/08/2020   Tobacco abuse 09/03/2015   Past Surgical History:  Procedure Laterality Date   ABDOMINAL HYSTERECTOMY     APPENDECTOMY     CHOLECYSTECTOMY     DILATION AND CURETTAGE, DIAGNOSTIC / THERAPEUTIC     JOINT REPLACEMENT     KNEE ARTHROSCOPY     right foot     TUBAL LIGATION      Family History  Problem Relation Age of Onset   Thyroid disease Sister    Coronary artery disease Father    Heart attack Father    COPD Sister    Diabetes Mellitus II Sister    Social History   Socioeconomic History   Marital status: Married    Spouse name: Not on file   Number of  children: 3   Years of education: Not on file   Highest education level: Bachelor's degree (e.g., BA, AB, BS)  Occupational History   Not on file  Tobacco Use   Smoking status: Former    Current packs/day: 1.00  Average packs/day: 1 pack/day for 30.0 years (30.0 ttl pk-yrs)    Types: Cigarettes    Passive exposure: Never   Smokeless tobacco: Never  Vaping Use   Vaping status: Never Used  Substance and Sexual Activity   Alcohol use: Yes    Alcohol/week: 8.0 standard drinks of alcohol    Types: 8 Standard drinks or equivalent per week    Comment: socially   Drug use: Never   Sexual activity: Not on file  Other Topics Concern   Not on file  Social History Narrative   ** Merged History Encounter **       Social Determinants of Health   Financial Resource Strain: Medium Risk (10/04/2023)   Overall Financial Resource Strain (CARDIA)    Difficulty of Paying Living Expenses: Somewhat hard  Food Insecurity: No Food Insecurity (10/04/2023)   Hunger Vital Sign    Worried About Running Out of Food in the Last Year: Never true    Ran Out of Food in the Last Year: Never true  Transportation Needs: No Transportation Needs (10/04/2023)   PRAPARE - Administrator, Civil Service (Medical): No    Lack of Transportation (Non-Medical): No  Physical Activity: Inactive (10/04/2023)   Exercise Vital Sign    Days of Exercise per Week: 0 days    Minutes of Exercise per Session: 30 min  Stress: Stress Concern Present (10/04/2023)   [employer] of Occupational Health - Occupational Stress Questionnaire    Feeling of Stress : To some extent  Social Connections: Moderately Isolated (10/04/2023)   Social Connection and Isolation Panel [NHANES]    Frequency of Communication with Friends and Family: More than three times a week    Frequency of Social Gatherings with Friends and Family: More than three times a week    Attends Religious Services: Never    Database administrator or  Organizations: No    Attends Engineer, structural: Not on file    Marital Status: Married    Objective:  BP 124/74   Pulse 65   Temp (!) 96.4 F (35.8 C)   Ht 5\' 6"  (1.676 m)   Wt 221 lb (100.2 kg)   LMP 01/04/2016 (Approximate)   SpO2 99%   BMI 35.67 kg/m      10/04/2023    8:33 AM 09/26/2023    9:07 AM 08/12/2023    2:36 PM  BP/Weight  Systolic BP 124 120 118  Diastolic BP 74 76 80  Wt. (Lbs) 221 212 210.4  BMI 35.67 kg/m2 34.22 kg/m2 33.96 kg/m2    Physical Exam  Diabetic Foot Exam - Simple   No data filed      Lab Results  Component Value Date   WBC 13.7 (H) 10/04/2023   HGB 14.3 10/04/2023   HCT 43.5 10/04/2023   PLT 309 10/04/2023   GLUCOSE 101 (H) 10/04/2023   CHOL 169 10/04/2023   TRIG 127 10/04/2023   HDL 54 10/04/2023   LDLCALC 93 10/04/2023   ALT 20 10/04/2023   AST 13 10/04/2023   NA 144 10/04/2023   K 3.8 10/04/2023   CL 104 10/04/2023   CREATININE 0.91 10/04/2023   BUN 13 10/04/2023   CO2 26 10/04/2023   TSH 1.200 01/03/2023   HGBA1C 5.6 02/26/2023      Assessment & Plan:    Mixed hyperlipidemia Assessment & Plan: Well controlled.  No medicines.  Continue to work on eating a healthy diet and exercise.  Labs  drawn today.    Orders: -     CBC with Differential/Platelet -     Comprehensive metabolic panel -     Lipid panel  Mild recurrent major depression (HCC) Assessment & Plan: The current medical regimen is effective;  continue present plan and medications. Wellbutrin 150 mg 3 in am, trazodone 100 mg at bedtime and Celexa 40 mg once daily . Ativan and Vistaril as needed. Rarely needs/takes lorazepam or vistaril.        Gastroesophageal reflux disease, unspecified whether esophagitis present Assessment & Plan: Continue omeprazole 40 mg daily.   Encounter for immunization -     Flu vaccine trivalent PF, 6mos and older(Flulaval,Afluria,Fluarix,Fluzone)  Other orders -     Fluticasone Propionate; Place 1  spray into both nostrils once daily.  Dispense: 48 g; Refill: 3     Meds ordered this encounter  Medications   fluticasone (FLONASE) 50 MCG/ACT nasal spray    Sig: Place 1 spray into both nostrils once daily.    Dispense:  48 g    Refill:  3    Orders Placed This Encounter  Procedures   Flu vaccine trivalent PF, 6mos and older(Flulaval,Afluria,Fluarix,Fluzone)   CBC with Differential/Platelet   Comprehensive metabolic panel   Lipid panel     Follow-up: Return in about 3 weeks (around 10/25/2023) for brady.  Total time spent on today's visit was greater than 420 minutes, including both face-to-face time and nonface-to-face time personally spent on review of chart (labs and imaging), discussing labs and goals, discussing further work-up, treatment options, referrals to specialist if needed, reviewing outside records of pertinent, answering patient's questions, and coordinating care.  I,Marla I Leal-Borjas,acting as a scribe for Blane Ohara, MD.,have documented all relevant documentation on the behalf of Blane Ohara, MD,as directed by  Blane Ohara, MD while in the presence of Blane Ohara, MD.   An After Visit Summary was printed and given to the patient.  Blane Ohara, MD [facility] [phone]

## 2023-10-04 ENCOUNTER — Other Ambulatory Visit: Payer: Self-pay

## 2023-10-04 ENCOUNTER — Ambulatory Visit: Payer: Commercial Managed Care - PPO | Admitting: Family Medicine

## 2023-10-04 ENCOUNTER — Encounter: Payer: Self-pay | Admitting: Family Medicine

## 2023-10-04 VITALS — BP 124/74 | HR 65 | Temp 96.4°F | Ht 66.0 in | Wt 221.0 lb

## 2023-10-04 DIAGNOSIS — F330 Major depressive disorder, recurrent, mild: Secondary | ICD-10-CM | POA: Diagnosis not present

## 2023-10-04 DIAGNOSIS — M542 Cervicalgia: Secondary | ICD-10-CM

## 2023-10-04 DIAGNOSIS — G43009 Migraine without aura, not intractable, without status migrainosus: Secondary | ICD-10-CM | POA: Diagnosis not present

## 2023-10-04 DIAGNOSIS — E782 Mixed hyperlipidemia: Secondary | ICD-10-CM | POA: Diagnosis not present

## 2023-10-04 DIAGNOSIS — K219 Gastro-esophageal reflux disease without esophagitis: Secondary | ICD-10-CM

## 2023-10-04 DIAGNOSIS — Z23 Encounter for immunization: Secondary | ICD-10-CM | POA: Diagnosis not present

## 2023-10-04 DIAGNOSIS — M2550 Pain in unspecified joint: Secondary | ICD-10-CM

## 2023-10-04 MED ORDER — FLUTICASONE PROPIONATE 50 MCG/ACT NA SUSP
1.0000 | Freq: Every day | NASAL | 3 refills | Status: DC
  Filled 2023-10-04: qty 48, 180d supply, fill #0
  Filled 2023-10-14: qty 48, 90d supply, fill #0
  Filled 2024-01-22: qty 48, 90d supply, fill #1
  Filled 2024-05-19: qty 48, 90d supply, fill #2
  Filled 2024-08-13: qty 48, 90d supply, fill #3

## 2023-10-05 LAB — CBC WITH DIFFERENTIAL/PLATELET
Basophils Absolute: 0.1 10*3/uL (ref 0.0–0.2)
Basos: 0 %
EOS (ABSOLUTE): 0.1 10*3/uL (ref 0.0–0.4)
Eos: 0 %
Hematocrit: 43.5 % (ref 34.0–46.6)
Hemoglobin: 14.3 g/dL (ref 11.1–15.9)
Immature Grans (Abs): 0.1 10*3/uL (ref 0.0–0.1)
Immature Granulocytes: 1 %
Lymphocytes Absolute: 3.5 10*3/uL — ABNORMAL HIGH (ref 0.7–3.1)
Lymphs: 25 %
MCH: 32.4 pg (ref 26.6–33.0)
MCHC: 32.9 g/dL (ref 31.5–35.7)
MCV: 99 fL — ABNORMAL HIGH (ref 79–97)
Monocytes Absolute: 0.5 10*3/uL (ref 0.1–0.9)
Monocytes: 4 %
Neutrophils Absolute: 9.5 10*3/uL — ABNORMAL HIGH (ref 1.4–7.0)
Neutrophils: 70 %
Platelets: 309 10*3/uL (ref 150–450)
RBC: 4.41 x10E6/uL (ref 3.77–5.28)
RDW: 12.4 % (ref 11.7–15.4)
WBC: 13.7 10*3/uL — ABNORMAL HIGH (ref 3.4–10.8)

## 2023-10-05 LAB — COMPREHENSIVE METABOLIC PANEL
ALT: 20 [IU]/L (ref 0–32)
AST: 13 [IU]/L (ref 0–40)
Albumin: 3.9 g/dL (ref 3.9–4.9)
Alkaline Phosphatase: 94 [IU]/L (ref 44–121)
BUN/Creatinine Ratio: 14 (ref 9–23)
BUN: 13 mg/dL (ref 6–24)
Bilirubin Total: 0.3 mg/dL (ref 0.0–1.2)
CO2: 26 mmol/L (ref 20–29)
Calcium: 9.4 mg/dL (ref 8.7–10.2)
Chloride: 104 mmol/L (ref 96–106)
Creatinine, Ser: 0.91 mg/dL (ref 0.57–1.00)
Globulin, Total: 2.3 g/dL (ref 1.5–4.5)
Glucose: 101 mg/dL — ABNORMAL HIGH (ref 70–99)
Potassium: 3.8 mmol/L (ref 3.5–5.2)
Sodium: 144 mmol/L (ref 134–144)
Total Protein: 6.2 g/dL (ref 6.0–8.5)
eGFR: 78 mL/min/{1.73_m2} (ref >59)

## 2023-10-05 LAB — LIPID PANEL
Chol/HDL Ratio: 3.1 {ratio} (ref 0.0–4.4)
Cholesterol, Total: 169 mg/dL (ref 100–199)
HDL: 54 mg/dL (ref >39)
LDL Chol Calc (NIH): 93 mg/dL (ref 0–99)
Triglycerides: 127 mg/dL (ref 0–149)
VLDL Cholesterol Cal: 22 mg/dL (ref 5–40)

## 2023-10-10 ENCOUNTER — Other Ambulatory Visit: Payer: Commercial Managed Care - PPO

## 2023-10-10 ENCOUNTER — Ambulatory Visit: Payer: Commercial Managed Care - PPO | Admitting: Oncology

## 2023-10-13 ENCOUNTER — Encounter: Payer: Self-pay | Admitting: Family Medicine

## 2023-10-13 NOTE — Assessment & Plan Note (Signed)
Continue Topamax 100 mg daily.

## 2023-10-13 NOTE — Assessment & Plan Note (Signed)
Continues to see chiropractor.

## 2023-10-13 NOTE — Assessment & Plan Note (Signed)
Return for further evaluation

## 2023-10-14 ENCOUNTER — Other Ambulatory Visit: Payer: Self-pay

## 2023-10-18 ENCOUNTER — Inpatient Hospital Stay: Payer: Commercial Managed Care - PPO

## 2023-10-18 ENCOUNTER — Inpatient Hospital Stay: Payer: Commercial Managed Care - PPO | Attending: Oncology | Admitting: Oncology

## 2023-10-18 VITALS — BP 133/97 | HR 81 | Temp 98.4°F | Resp 16 | Ht 66.0 in | Wt 221.7 lb

## 2023-10-18 DIAGNOSIS — Z7951 Long term (current) use of inhaled steroids: Secondary | ICD-10-CM | POA: Diagnosis not present

## 2023-10-18 DIAGNOSIS — G4733 Obstructive sleep apnea (adult) (pediatric): Secondary | ICD-10-CM | POA: Diagnosis not present

## 2023-10-18 DIAGNOSIS — D72829 Elevated white blood cell count, unspecified: Secondary | ICD-10-CM | POA: Insufficient documentation

## 2023-10-18 DIAGNOSIS — Z8616 Personal history of COVID-19: Secondary | ICD-10-CM | POA: Insufficient documentation

## 2023-10-18 DIAGNOSIS — Z79899 Other long term (current) drug therapy: Secondary | ICD-10-CM | POA: Diagnosis not present

## 2023-10-18 DIAGNOSIS — E6609 Other obesity due to excess calories: Secondary | ICD-10-CM

## 2023-10-18 DIAGNOSIS — Z6834 Body mass index (BMI) 34.0-34.9, adult: Secondary | ICD-10-CM

## 2023-10-18 DIAGNOSIS — F17210 Nicotine dependence, cigarettes, uncomplicated: Secondary | ICD-10-CM | POA: Insufficient documentation

## 2023-10-18 DIAGNOSIS — E66811 Obesity, class 1: Secondary | ICD-10-CM | POA: Diagnosis not present

## 2023-10-18 DIAGNOSIS — Z791 Long term (current) use of non-steroidal anti-inflammatories (NSAID): Secondary | ICD-10-CM | POA: Insufficient documentation

## 2023-10-18 DIAGNOSIS — K219 Gastro-esophageal reflux disease without esophagitis: Secondary | ICD-10-CM | POA: Diagnosis not present

## 2023-10-18 DIAGNOSIS — R768 Other specified abnormal immunological findings in serum: Secondary | ICD-10-CM | POA: Diagnosis not present

## 2023-10-18 DIAGNOSIS — Z7952 Long term (current) use of systemic steroids: Secondary | ICD-10-CM

## 2023-10-18 LAB — FERRITIN: Ferritin: 145 ng/mL (ref 11–307)

## 2023-10-18 NOTE — Progress Notes (Signed)
[facility] Cancer Follow up Visit:  Patient Care Team: Blane Ohara, MD as PCP - General (Family Medicine)  CHIEF COMPLAINTS/PURPOSE OF CONSULTATION:    HISTORY OF PRESENTING ILLNESS: Nichole Gonzalez 47 y.o. female is here because of leukocytosis Medical history is notable for GERD, seasonal allergies, tobacco use, obesity  January 03, 2023: WBC 12.2 hemoglobin 14.5 platelet count 301; 70 segs 26 lymphs 4 monos ANA negative CCP negative rheumatoid factor 16.2 SSA/SSB negative Ferritin 376 B12 1756 Hemoglobin A1c 5.6 TSH 1.2 CMP normal  January 23, 2023: Sleep study likely positive for OSA  March 11 2023:  [facility] Hematology Consult  Social:  Patient scheduler.  Tobacco smoked 1 ppd x 30 yrs; quit November 2023.  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  Serum free kappa 24 lambda 22.1 with a kappa lambda 1.09 IgG 966 IgA a 182 IgM 120 ANCA titers negative CRP 0.8 sed rate 16 cortisol 4.5 CK 98 QuantiFERON TB Gold negative.  HIV negative  March 25 2023:  .  Reviewed results of labs with patient.   Patient states that she was diagnosed with OSA but can not afford the CPAP machine.   Follow up 6 months  July 22, 2023: WBC 7.3 hemoglobin 14.0 platelet count 245  October 18 2023:  Scheduled follow up for granulocytosis.  Has not gotten a CPAP machine.  Has been on and off steroids.  Since last visit also had a bout of pleuropericarditis diagnosed after she presented with chest pain treated with NSAIDS; was diagnosed with COVID 3 days following that diagnosis. Discussed importance of obtaining a CPAP machine.     WBC 13.7 hemoglobin 14.3 MCV 99 platelet count 309; 7625 lymphs 4 monos  Review of Systems  Constitutional:  Negative for appetite change, chills, fatigue and fever.  HENT:   Negative for mouth sores, sore  throat, trouble swallowing and voice change.        Some left sided nose bleeds owing to chronic rhinitis Has dry mouth and occasionally dry eyes  Eyes:  Negative for eye problems and icterus.       Vision changes:  None  Respiratory:  Negative for chest tightness, cough, hemoptysis, shortness of breath and wheezing.        PND:  none Orthopnea:  none DOE:    Cardiovascular:  Negative for chest pain, leg swelling and palpitations.       PND:  none Orthopnea:  none  Gastrointestinal:  Negative for abdominal pain, blood in stool, constipation, diarrhea, nausea and vomiting.  Endocrine: Negative for hot flashes.       Cold intolerance:  none Heat intolerance:  none  Genitourinary:  Negative for bladder incontinence, difficulty urinating, dysuria, frequency, hematuria and nocturia.   Musculoskeletal:  Negative for back pain, gait problem and neck stiffness.       Has chronic arthralgias of neck, knees, shoulders, lower back Myalgias of shoulders  Skin:  Negative for rash and wound.       Had a period where she had pruritus on legs  Neurological:  Positive for headaches. Negative for extremity weakness, gait problem, light-headedness, seizures and speech difficulty.       Dizziness on standing.  Hands get numb and tingly  Hematological:  Negative for adenopathy. Does not bruise/bleed easily.  Psychiatric/Behavioral:  Negative for sleep disturbance and suicidal ideas.  The patient is not nervous/anxious.     MEDICAL HISTORY: 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   Primary insomnia 09/08/2020   Tobacco abuse 09/03/2015    SURGICAL HISTORY: Past Surgical History:  Procedure Laterality Date   ABDOMINAL  HYSTERECTOMY     APPENDECTOMY     CHOLECYSTECTOMY     DILATION AND CURETTAGE, DIAGNOSTIC / THERAPEUTIC     JOINT REPLACEMENT     KNEE ARTHROSCOPY     right foot     TUBAL LIGATION      SOCIAL HISTORY: Social History   Socioeconomic History   Marital status: Married    Spouse name: Not on file   Number of children: 3   Years of education: Not on file   Highest education level: Bachelor's degree (e.g., BA, AB, BS)  Occupational History   Not on file  Tobacco Use   Smoking status: Former    Current packs/day: 1.00    Average packs/day: 1 pack/day for 30.0 years (30.0 ttl pk-yrs)    Types: Cigarettes    Passive exposure: Never   Smokeless tobacco: Never  Vaping Use   Vaping status: Never Used  Substance and Sexual Activity   Alcohol use: Yes    Alcohol/week: 8.0 standard drinks of alcohol    Types: 8 Standard drinks or equivalent per week    Comment: socially   Drug use: Never   Sexual activity: Not on file  Other Topics Concern   Not on file  Social History Narrative   ** Merged History Encounter **       Social Determinants of Health   Financial Resource Strain: Medium Risk (10/04/2023)   Overall Financial Resource Strain (CARDIA)    Difficulty of Paying Living Expenses: Somewhat hard  Food Insecurity: No Food Insecurity (10/04/2023)   Hunger Vital Sign    Worried About Running Out of Food in the Last Year: Never true    Ran Out of Food in the Last Year: Never true  Transportation Needs: No Transportation Needs (10/04/2023)   PRAPARE - Administrator, Civil Service (Medical): No    Lack of Transportation (Non-Medical): No  Physical Activity: Inactive (10/04/2023)   Exercise Vital Sign    Days of Exercise per Week: 0 days    Minutes of Exercise per Session: 30 min  Stress: Stress Concern Present (10/04/2023)   [employer] of Occupational Health - Occupational Stress Questionnaire    Feeling of Stress : To some extent  Social Connections:  Moderately Isolated (10/04/2023)   Social Connection and Isolation Panel [NHANES]    Frequency of Communication with Friends and Family: More than three times a week    Frequency of Social Gatherings with Friends and Family: More than three times a week    Attends Religious Services: Never    Database administrator or Organizations: No    Attends Engineer, structural: Not on file    Marital Status: Married  Catering manager Violence: Not At Risk (09/27/2022)   Humiliation, Afraid, Rape, and Kick questionnaire    Fear of Current or Ex-Partner: No    Emotionally Abused: No    Physically Abused: No    Sexually Abused: No    FAMILY HISTORY  Family History  Problem Relation Age of Onset   Thyroid disease Sister    Coronary artery disease Father    Heart attack Father    COPD Sister    Diabetes Mellitus II Sister     ALLERGIES:  is allergic to morphine and codeine, bee pollen, codeine, and rosuvastatin.  MEDICATIONS:  Current Outpatient Medications  Medication Sig Dispense Refill   Ascorbic Acid (VITAMIN C PO) Take by mouth.     atorvastatin (LIPITOR) 10 MG tablet Take 1 tablet (10 mg total) by mouth daily. 30 tablet 2   buPROPion (WELLBUTRIN XL) 150 MG 24 hr tablet Take 3 tablets (450 mg total) by mouth daily. 270 tablet 0   celecoxib (CELEBREX) 100 MG capsule Take 1 capsule (100 mg total) by mouth 2 (two) times daily. 20 capsule 0   cetirizine (ZYRTEC ALLERGY) 10 MG tablet Take 1 tablet (10 mg total) by mouth daily. 90 tablet 3   citalopram (CELEXA) 40 MG tablet Take 1 tablet (40 mg total) by mouth daily. 90 tablet 1   cyclobenzaprine (FLEXERIL) 10 MG tablet Take 1 tablet (10 mg total) by mouth at bedtime. 90 tablet 0   EPIPEN 2-PAK 0.3 MG/0.3ML SOAJ injection   0   fluticasone (FLONASE) 50 MCG/ACT nasal spray Place 1 spray into both nostrils once daily. 48 g 3   hydrOXYzine (VISTARIL) 25 MG capsule Take 1 capsule (25 mg total) by mouth every 8 (eight) hours as needed. 90  capsule 0   LORazepam (ATIVAN) 0.5 MG tablet Take 1 tablet (0.5 mg total) by mouth 2 (two) times daily as needed (panic attacks). 30 tablet 1   omeprazole (PRILOSEC) 40 MG capsule Take 1 capsule (40 mg total) by mouth daily. 90 capsule 0   potassium chloride (KLOR-CON) 10 MEQ tablet Take 1 tablet (10 mEq total) by mouth daily. 30 tablet 3   topiramate (TOPAMAX) 100 MG tablet Take 1 tablet (100 mg total) by mouth daily. 90 tablet 0   traZODone (DESYREL) 100 MG tablet Take 1 tablet (100 mg total) by mouth at bedtime. 90 tablet 0   No current facility-administered medications for this visit.    PHYSICAL EXAMINATION:  ECOG PERFORMANCE STATUS: 0 - Asymptomatic   There were no vitals filed for this visit.   There were no vitals filed for this visit.    Physical Exam Vitals and nursing note reviewed.  Constitutional:      Appearance: Normal appearance. She is not toxic-appearing or diaphoretic.     Comments: Here alone.    HENT:     Head: Normocephalic and atraumatic.     Right Ear: External ear normal.     Left Ear: External ear normal.     Nose: Nose normal. No congestion or rhinorrhea.  Eyes:     General: No scleral icterus.    Extraocular Movements: Extraocular movements intact.     Conjunctiva/sclera: Conjunctivae normal.     Pupils: Pupils are equal, round, and reactive to light.  Cardiovascular:     Rate and Rhythm: Normal rate.     Heart sounds: No murmur heard.    No friction rub. No gallop.  Abdominal:     General: Bowel sounds are normal.     Palpations: Abdomen is soft.  Musculoskeletal:        General: No swelling, tenderness or deformity.     Cervical back: Normal range of motion and neck supple. No rigidity or tenderness.  Lymphadenopathy:     Head:  Right side of head: No submental, submandibular, tonsillar, preauricular, posterior auricular or occipital adenopathy.     Left side of head: No submental, submandibular, tonsillar, preauricular, posterior  auricular or occipital adenopathy.     Cervical: No cervical adenopathy.     Right cervical: No superficial, deep or posterior cervical adenopathy.    Left cervical: No superficial, deep or posterior cervical adenopathy.     Upper Body:     Right upper body: No supraclavicular, axillary, pectoral or epitrochlear adenopathy.     Left upper body: No supraclavicular, axillary, pectoral or epitrochlear adenopathy.  Skin:    General: Skin is warm.     Coloration: Skin is not jaundiced.  Neurological:     General: No focal deficit present.     Mental Status: She is alert and oriented to person, place, and time. Mental status is at baseline.     Cranial Nerves: No cranial nerve deficit.  Psychiatric:        Mood and Affect: Mood normal.        Behavior: Behavior normal.        Thought Content: Thought content normal.        Judgment: Judgment normal.    LABORATORY DATA: I have personally reviewed the data as listed:  Office Visit on 10/04/2023  Component Date Value Ref Range Status   WBC 10/04/2023 13.7 (H)  3.4 - 10.8 x10E3/uL Final   RBC 10/04/2023 4.41  3.77 - 5.28 x10E6/uL Final   Hemoglobin 10/04/2023 14.3  11.1 - 15.9 g/dL Final   Hematocrit 86/57/8469 43.5  34.0 - 46.6 % Final   MCV 10/04/2023 99 (H)  79 - 97 fL Final   MCH 10/04/2023 32.4  26.6 - 33.0 pg Final   MCHC 10/04/2023 32.9  31.5 - 35.7 g/dL Final   RDW 62/95/2841 12.4  11.7 - 15.4 % Final   Platelets 10/04/2023 309  150 - 450 x10E3/uL Final   Neutrophils 10/04/2023 70  Not Estab. % Final   Lymphs 10/04/2023 25  Not Estab. % Final   Monocytes 10/04/2023 4  Not Estab. % Final   Eos 10/04/2023 0  Not Estab. % Final   Basos 10/04/2023 0  Not Estab. % Final   Neutrophils Absolute 10/04/2023 9.5 (H)  1.4 - 7.0 x10E3/uL Final   Lymphocytes Absolute 10/04/2023 3.5 (H)  0.7 - 3.1 x10E3/uL Final   Monocytes Absolute 10/04/2023 0.5  0.1 - 0.9 x10E3/uL Final   EOS (ABSOLUTE) 10/04/2023 0.1  0.0 - 0.4 x10E3/uL Final    Basophils Absolute 10/04/2023 0.1  0.0 - 0.2 x10E3/uL Final   Immature Granulocytes 10/04/2023 1  Not Estab. % Final   Immature Grans (Abs) 10/04/2023 0.1  0.0 - 0.1 x10E3/uL Final   Glucose 10/04/2023 101 (H)  70 - 99 mg/dL Final   BUN 32/44/0102 13  6 - 24 mg/dL Final   Creatinine, Ser 10/04/2023 0.91  0.57 - 1.00 mg/dL Final   eGFR 72/53/6644 78  >59 mL/min/1.73 Final   BUN/Creatinine Ratio 10/04/2023 14  9 - 23 Final   Sodium 10/04/2023 144  134 - 144 mmol/L Final   Potassium 10/04/2023 3.8  3.5 - 5.2 mmol/L Final   Chloride 10/04/2023 104  96 - 106 mmol/L Final   CO2 10/04/2023 26  20 - 29 mmol/L Final   Calcium 10/04/2023 9.4  8.7 - 10.2 mg/dL Final   Total Protein 03/47/4259 6.2  6.0 - 8.5 g/dL Final   Albumin 56/38/7564 3.9  3.9 - 4.9  g/dL Final   Globulin, Total 10/04/2023 2.3  1.5 - 4.5 g/dL Final   Bilirubin Total 10/04/2023 0.3  0.0 - 1.2 mg/dL Final   Alkaline Phosphatase 10/04/2023 94  44 - 121 IU/L Final   AST 10/04/2023 13  0 - 40 IU/L Final   ALT 10/04/2023 20  0 - 32 IU/L Final   Cholesterol, Total 10/04/2023 169  100 - 199 mg/dL Final   Triglycerides 09/81/1914 127  0 - 149 mg/dL Final   HDL 78/29/5621 54  >39 mg/dL Final   VLDL Cholesterol Cal 10/04/2023 22  5 - 40 mg/dL Final   LDL Chol Calc (NIH) 10/04/2023 93  0 - 99 mg/dL Final   Chol/HDL Ratio 10/04/2023 3.1  0.0 - 4.4 ratio Final   Comment:                                   T. Chol/HDL Ratio                                             Men  Women                               1/2 Avg.Risk  3.4    3.3                                   Avg.Risk  5.0    4.4                                2X Avg.Risk  9.6    7.1                                3X Avg.Risk 23.4   11.0   Office Visit on 09/26/2023  Component Date Value Ref Range Status   SARS Coronavirus 2 Ag 09/30/2023 Negative  Negative Final   Influenza A, POC 09/30/2023 Negative  Negative Final   Influenza B, POC 09/30/2023 Negative  Negative Final     RADIOGRAPHIC STUDIES: I have personally reviewed the radiological images as listed and agree with the findings in the report  No results found.  ASSESSMENT/PLAN  47 y.o. female is here because of leukocytosis.  Medical history is notable for GERD, seasonal allergies, tobacco use, obesity  Granulocytosis:  Main etiologies in this patient are 1) use of systemic and inhaled steroids 2) obesity March 11 2023- FISH for bcr abl negative.  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  Will follow and hold on phlebotomy for noe       Rheumatoid Factor:  Antibodies directed against the Fc portion of immunoglobulins. Possess significant heterogeneity related to mutations within heavy and light chain genes.  Associated with Rheumatologic and Non-Rhematologic disorders. Rheumatic disorders -- Rheumatoid arthritis (RA), Sjgren's disease, Mixed connective tissue disease, Mixed cryoglobulinemia (types II and  III), SLE, Polymyositis or dermatomyositis Nonrheumatic disorders -- Nonrheumatic disorders characterized by chronic antigenic stimulation (especially with circulating immune complexes or polyclonal B lymphocyte activation) commonly induce RF production.  January 03 2023- RF 16.2 CCP negative.  ANA/SSA/SSB negative.   March 25 2023- Given the low level of positivity and absence of symptoms can follow her clinically  OSA  October 18 2023- Discussed importance of treatment with CPAP   Cancer Staging  No matching staging information was found for the patient.    No problem-specific Assessment & Plan notes found for this encounter.    No orders of the defined types were placed in this encounter.   30  minutes was spent in patient care.  This included time spent preparing to see the patient (e.g.,  review of tests), obtaining and/or reviewing separately obtained history, counseling and educating the patient, ordering tests, or procedures; documenting clinical information in the electronic or other health record, independently interpreting results and communicating results to the patient as well as coordination of care.       All questions were answered. The patient knows to call the clinic with any problems, questions or concerns.  This note was electronically signed.    Loni Muse, MD  10/18/2023 8:38 AM

## 2023-10-19 LAB — ANGIOTENSIN CONVERTING ENZYME: Angiotensin-Converting Enzyme: 87 U/L — ABNORMAL HIGH (ref 14–82)

## 2023-10-19 LAB — RPR: RPR Ser Ql: NONREACTIVE

## 2023-10-25 ENCOUNTER — Ambulatory Visit: Payer: Commercial Managed Care - PPO | Admitting: Physician Assistant

## 2023-10-25 ENCOUNTER — Encounter: Payer: Self-pay | Admitting: Physician Assistant

## 2023-10-25 VITALS — BP 130/76 | HR 82 | Temp 97.2°F | Ht 66.0 in | Wt 222.0 lb

## 2023-10-25 DIAGNOSIS — M25561 Pain in right knee: Secondary | ICD-10-CM

## 2023-10-25 DIAGNOSIS — M25562 Pain in left knee: Secondary | ICD-10-CM

## 2023-10-25 NOTE — Progress Notes (Signed)
Subjective:  Patient ID: Nichole Gonzalez, female    DOB: 02-Jan-1976  Age: 47 y.o. MRN: [id number]  No chief complaint on file.   HPI   States she is here to discuss possible joint injections and xrays. States that her knee will sometimes lock and cause her pain. She will then have to wear a brace to try and help till it feels better. She admits to not being able to bending it fully when the pain gets really bad.   Find physical therapist that can do dry needling.      10/04/2023    8:35 AM 06/13/2023    8:37 AM 02/26/2023    8:32 AM 01/03/2023    7:41 AM 01/03/2023    7:40 AM  Depression screen PHQ 2/9  Decreased Interest 1 1 2  0 0  Down, Depressed, Hopeless 0 3 2 0 0  PHQ - 2 Score 1 4 4  0 0  Altered sleeping 0 1 3 1    Tired, decreased energy 1 1 3 2    Change in appetite 1 3 0 1   Feeling bad or failure about yourself  0 3 0 0   Trouble concentrating 0 3 3 1    Moving slowly or fidgety/restless 0 2 0 0   Suicidal thoughts 0 0 0 0   PHQ-9 Score 3 17 13 5    Difficult doing work/chores Not difficult at all Very difficult Somewhat difficult Somewhat difficult         10/04/2023    8:35 AM  Fall Risk   Falls in the past year? 0  Number falls in past yr: 0  Injury with Fall? 0  Risk for fall due to : No Fall Risks  Follow up Falls evaluation completed    Patient Care Team: Blane Ohara, MD as PCP - General (Family Medicine)   Review of Systems  Constitutional:  Positive for fatigue. Negative for chills and fever.  HENT:  Negative for congestion, ear pain, rhinorrhea and sore throat.   Respiratory:  Negative for cough and shortness of breath.   Cardiovascular:  Negative for chest pain.  Gastrointestinal:  Negative for abdominal pain, constipation, diarrhea, nausea and vomiting.  Genitourinary:  Negative for dysuria and urgency.  Musculoskeletal:  Positive for arthralgias. Negative for back pain and myalgias.  Neurological:  Negative for dizziness, weakness,  light-headedness and headaches.  Psychiatric/Behavioral:  Negative for dysphoric mood. The patient is not nervous/anxious.     Current Outpatient Medications on File Prior to Visit  Medication Sig Dispense Refill   Ascorbic Acid (VITAMIN C PO) Take by mouth.     atorvastatin (LIPITOR) 10 MG tablet Take 1 tablet (10 mg total) by mouth daily. 30 tablet 2   buPROPion (WELLBUTRIN XL) 150 MG 24 hr tablet Take 3 tablets (450 mg total) by mouth daily. 270 tablet 0   celecoxib (CELEBREX) 100 MG capsule Take 1 capsule (100 mg total) by mouth 2 (two) times daily. 20 capsule 0   cetirizine (ZYRTEC ALLERGY) 10 MG tablet Take 1 tablet (10 mg total) by mouth daily. 90 tablet 3   citalopram (CELEXA) 40 MG tablet Take 1 tablet (40 mg total) by mouth daily. 90 tablet 1   cyclobenzaprine (FLEXERIL) 10 MG tablet Take 1 tablet (10 mg total) by mouth at bedtime. 90 tablet 0   EPIPEN 2-PAK 0.3 MG/0.3ML SOAJ injection   0   fluticasone (FLONASE) 50 MCG/ACT nasal spray Place 1 spray into both nostrils once daily. 48 g 3  hydrOXYzine (VISTARIL) 25 MG capsule Take 1 capsule (25 mg total) by mouth every 8 (eight) hours as needed. 90 capsule 0   LORazepam (ATIVAN) 0.5 MG tablet Take 1 tablet (0.5 mg total) by mouth 2 (two) times daily as needed (panic attacks). 30 tablet 1   omeprazole (PRILOSEC) 40 MG capsule Take 1 capsule (40 mg total) by mouth daily. 90 capsule 0   potassium chloride (KLOR-CON) 10 MEQ tablet Take 1 tablet (10 mEq total) by mouth daily. 30 tablet 3   topiramate (TOPAMAX) 100 MG tablet Take 1 tablet (100 mg total) by mouth daily. 90 tablet 0   traZODone (DESYREL) 100 MG tablet Take 1 tablet (100 mg total) by mouth at bedtime. 90 tablet 0   No current facility-administered medications on file prior to visit.   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   Primary insomnia 09/08/2020   Tobacco abuse 09/03/2015   Past Surgical History:  Procedure Laterality Date   ABDOMINAL HYSTERECTOMY     APPENDECTOMY     CHOLECYSTECTOMY     DILATION AND CURETTAGE, DIAGNOSTIC / THERAPEUTIC     JOINT REPLACEMENT     KNEE ARTHROSCOPY     right foot     TUBAL LIGATION      Family History  Problem Relation Age of Onset   Thyroid disease Sister    Coronary artery disease Father    Heart attack Father    COPD Sister    Diabetes Mellitus II Sister    Social History   Socioeconomic History   Marital status: Married    Spouse name: Not on file   Number of children: 3   Years of education: Not on file   Highest education level: Bachelor's degree (e.g., BA, AB, BS)  Occupational History   Not on file  Tobacco Use   Smoking status: Former    Current packs/day: 1.00    Average packs/day: 1 pack/day for 30.0 years (30.0 ttl pk-yrs)    Types: Cigarettes    Passive exposure: Never   Smokeless tobacco: Never  Vaping Use   Vaping status: Never Used  Substance and Sexual Activity   Alcohol use: Yes    Alcohol/week: 8.0 standard drinks of alcohol    Types: 8 Standard drinks or equivalent per week    Comment: socially   Drug use: Never   Sexual activity: Not on file  Other Topics Concern   Not on file  Social History Narrative   ** Merged History Encounter **       Social Determinants of Health   Financial Resource Strain: Medium Risk (10/04/2023)   Overall Financial Resource Strain (CARDIA)    Difficulty of Paying Living Expenses: Somewhat hard  Food Insecurity: No Food Insecurity (10/04/2023)   Hunger Vital Sign    Worried About Running Out of Food in the Last Year: Never true    Ran Out of Food in the Last Year: Never true  Transportation Needs: No Transportation Needs (10/04/2023)   PRAPARE -  Administrator, Civil Service (Medical): No    Lack of Transportation (Non-Medical): No  Physical Activity: Inactive (10/04/2023)   Exercise Vital Sign    Days of Exercise per Week: 0 days  Minutes of Exercise per Session: 30 min  Stress: Stress Concern Present (10/04/2023)   [employer] of Occupational Health - Occupational Stress Questionnaire    Feeling of Stress : To some extent  Social Connections: Moderately Isolated (10/04/2023)   Social Connection and Isolation Panel [NHANES]    Frequency of Communication with Friends and Family: More than three times a week    Frequency of Social Gatherings with Friends and Family: More than three times a week    Attends Religious Services: Never    Database administrator or Organizations: No    Attends Engineer, structural: Not on file    Marital Status: Married    Objective:  BP 130/76   Pulse 82   Temp (!) 97.2 F (36.2 C)   Ht 5\' 6"  (1.676 m)   Wt 222 lb (100.7 kg)   LMP 01/04/2016 (Approximate)   SpO2 99%   BMI 35.83 kg/m      10/25/2023    8:51 AM 10/18/2023    9:43 AM 10/04/2023    8:33 AM  BP/Weight  Systolic BP 130 133 124  Diastolic BP 76 97 74  Wt. (Lbs) 222 221.7 221  BMI 35.83 kg/m2 35.78 kg/m2 35.67 kg/m2    Physical Exam Vitals reviewed.  Constitutional:      Appearance: Normal appearance.  Neck:     Vascular: No carotid bruit.  Cardiovascular:     Rate and Rhythm: Normal rate and regular rhythm.     Heart sounds: Normal heart sounds.  Pulmonary:     Effort: Pulmonary effort is normal.     Breath sounds: Normal breath sounds.  Abdominal:     General: Bowel sounds are normal.     Palpations: Abdomen is soft.     Tenderness: There is no abdominal tenderness.  Musculoskeletal:     Right knee: Crepitus present. No bony tenderness. Tenderness present over the medial joint line.     Left knee: Crepitus present. No bony tenderness. Tenderness present.  Neurological:     Mental  Status: She is alert and oriented to person, place, and time.  Psychiatric:        Mood and Affect: Mood normal.        Behavior: Behavior normal.     Diabetic Foot Exam - Simple   No data filed      Lab Results  Component Value Date   WBC 13.7 (H) 10/04/2023   HGB 14.3 10/04/2023   HCT 43.5 10/04/2023   PLT 309 10/04/2023   GLUCOSE 101 (H) 10/04/2023   CHOL 169 10/04/2023   TRIG 127 10/04/2023   HDL 54 10/04/2023   LDLCALC 93 10/04/2023   ALT 20 10/04/2023   AST 13 10/04/2023   NA 144 10/04/2023   K 3.8 10/04/2023   CL 104 10/04/2023   CREATININE 0.91 10/04/2023   BUN 13 10/04/2023   CO2 26 10/04/2023   TSH 1.200 01/03/2023   HGBA1C 5.6 02/26/2023   Joint Injection/Arthrocentesis  Date/Time: 10/25/2023 9:37 AM  Performed by: Langley Gauss, PA Authorized by: Langley Gauss, PA  Indications: pain  Body area: knee Joint: right knee Local anesthesia used: yes  Anesthesia: Local anesthesia used: yes Local Anesthetic: co-phenylcaine spray  Sedation: Patient sedated: no  Needle gauge: 23G. Ultrasound guidance: no Approach: anterior Aspirate: clear Aspirate amount: 1 mL Methylprednisolone amount: 80 mg Lidocaine 1% amount: 5 mL Patient tolerance: patient tolerated the procedure well with no immediate complications    Total time spent  on today's visit was greater than 20 minutes, including both face-to-face time and nonface-to-face time personally spent on review of chart (labs and imaging), discussing labs and goals, discussing further work-up, treatment options, referrals to specialist if needed, reviewing outside records of pertinent, answering patient's questions, and coordinating care.    Assessment & Plan:    Arthralgia of both knees Assessment & Plan: Injection given to right knee Will monitor symptoms over next few weeks If improvement we will schedule to inject left knee If no improvement we will refer to orthopedist for possible scope.     Other orders -     Arthrocentesis     No orders of the defined types were placed in this encounter.   Orders Placed This Encounter  Procedures   Joint Injection/Arthrocentesis     Follow-up: No follow-ups on file.   I,Katherina A Bramblett,acting as a scribe for [employer], PA.,have documented all relevant documentation on the behalf of Langley Gauss, PA,as directed by  Langley Gauss, PA while in the presence of Langley Gauss, [place].   An After Visit Summary was printed and given to the patient.  Langley Gauss, [place] [facility] [phone]

## 2023-10-25 NOTE — Assessment & Plan Note (Signed)
Injection given to right knee Will monitor symptoms over next few weeks If improvement we will schedule to inject left knee If no improvement we will refer to orthopedist for possible scope.

## 2023-10-28 ENCOUNTER — Other Ambulatory Visit: Payer: Self-pay

## 2023-10-28 ENCOUNTER — Telehealth: Payer: Commercial Managed Care - PPO | Admitting: Physician Assistant

## 2023-10-28 DIAGNOSIS — R030 Elevated blood-pressure reading, without diagnosis of hypertension: Secondary | ICD-10-CM

## 2023-10-28 DIAGNOSIS — T887XXA Unspecified adverse effect of drug or medicament, initial encounter: Secondary | ICD-10-CM

## 2023-10-28 MED ORDER — HYDROCHLOROTHIAZIDE 25 MG PO TABS
25.0000 mg | ORAL_TABLET | Freq: Every day | ORAL | 0 refills | Status: DC
Start: 2023-10-28 — End: 2023-11-26
  Filled 2023-10-28: qty 7, 7d supply, fill #0

## 2023-10-28 NOTE — Progress Notes (Signed)
Virtual Visit Consent   Nichole Gonzalez, you are scheduled for a virtual visit with a [facility] provider today. Just as with appointments in the office, your consent must be obtained to participate. Your consent will be active for this visit and any virtual visit you may have with one of our providers in the next 365 days. If you have a MyChart account, a copy of this consent can be sent to you electronically.  As this is a virtual visit, video technology does not allow for your provider to perform a traditional examination. This may limit your provider's ability to fully assess your condition. If your provider identifies any concerns that need to be evaluated in person or the need to arrange testing (such as labs, EKG, etc.), we will make arrangements to do so. Although advances in technology are sophisticated, we cannot ensure that it will always work on either your end or our end. If the connection with a video visit is poor, the visit may have to be switched to a telephone visit. With either a video or telephone visit, we are not always able to ensure that we have a secure connection.  By engaging in this virtual visit, you consent to the provision of healthcare and authorize for your insurance to be billed (if applicable) for the services provided during this visit. Depending on your insurance coverage, you may receive a charge related to this service.  I need to obtain your verbal consent now. Are you willing to proceed with your visit today? Nichole Gonzalez has provided verbal consent on 10/28/2023 for a virtual visit (video or telephone). Margaretann Loveless, PA-C  Date: 10/28/2023 12:45 PM  Virtual Visit via Video Note   I, Margaretann Loveless, connected with  Nichole Gonzalez  ([id number], 1976-07-01) on 10/28/23 at 12:30 PM EST by a video-enabled telemedicine application and verified that I am speaking with the correct person using two identifiers.  Location: Patient:  Virtual Visit Location Patient: Other: work; isolated Provider: Engineer, mining Provider: Home Office   I discussed the limitations of evaluation and management by telemedicine and the availability of in person appointments. The patient expressed understanding and agreed to proceed.    History of Present Illness: Nichole Gonzalez is a 47 y.o. who identifies as a female who was assigned female at birth, and is being seen today for elevated BP 159/105 on Saturday, 150/93 over the weekend. Was 130/100 this morning. Did have right knee steroid injection on 10/25/23. On Saturday, 10/26/23 she developed flushing of the face and felt hot. Checked her BP and it was at the highest 159/105. It has been staying slightly elevated and she does have a mild associated headache. Denies chest pain, shortness of breath, dizziness, palpitations, vision changes, nausea, vomiting, or weakness on one side of the body.    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    Codeine Other (See Comments)    Fainting.   Rosuvastatin     GI upset   Medications:  Current Outpatient Medications:    hydrochlorothiazide (HYDRODIURIL) 25 MG tablet, Take 1 tablet (25 mg total) by mouth  daily., Disp: 7 tablet, Rfl: 0   Ascorbic Acid (VITAMIN C PO), Take by mouth., Disp: , Rfl:    atorvastatin (LIPITOR) 10 MG tablet, Take 1 tablet (10 mg total) by mouth daily., Disp: 30 tablet, Rfl: 2   buPROPion (WELLBUTRIN XL) 150 MG 24 hr tablet, Take 3 tablets (450 mg total) by mouth daily., Disp: 270 tablet, Rfl: 0   celecoxib (CELEBREX) 100 MG capsule, Take 1 capsule (100 mg total) by mouth 2 (two) times daily., Disp: 20 capsule, Rfl: 0   cetirizine (ZYRTEC ALLERGY) 10 MG tablet, Take 1 tablet (10 mg total) by mouth daily., Disp: 90 tablet, Rfl: 3   citalopram (CELEXA) 40 MG tablet, Take 1 tablet (40 mg total) by mouth daily., Disp: 90 tablet, Rfl: 1   cyclobenzaprine (FLEXERIL) 10 MG tablet, Take 1 tablet (10 mg total) by mouth at bedtime., Disp: 90 tablet, Rfl: 0   EPIPEN 2-PAK 0.3 MG/0.3ML SOAJ injection, , Disp: , Rfl: 0   fluticasone (FLONASE) 50 MCG/ACT nasal spray, Place 1 spray into both nostrils once daily., Disp: 48 g, Rfl: 3   hydrOXYzine (VISTARIL) 25 MG capsule, Take 1 capsule (25 mg total) by mouth every 8 (eight) hours as needed., Disp: 90 capsule, Rfl: 0   LORazepam (ATIVAN) 0.5 MG tablet, Take 1 tablet (0.5 mg total) by mouth 2 (two) times daily as needed (panic attacks)., Disp: 30 tablet, Rfl: 1   omeprazole (PRILOSEC) 40 MG capsule, Take 1 capsule (40 mg total) by mouth daily., Disp: 90 capsule, Rfl: 0   potassium chloride (KLOR-CON) 10 MEQ tablet, Take 1 tablet (10 mEq total) by mouth daily., Disp: 30 tablet, Rfl: 3   topiramate (TOPAMAX) 100 MG tablet, Take 1 tablet (100 mg total) by mouth daily., Disp: 90 tablet, Rfl: 0   traZODone (DESYREL) 100 MG tablet, Take 1 tablet (100 mg total) by mouth at bedtime., Disp: 90 tablet, Rfl: 0  Observations/Objective: Patient is well-developed, well-nourished in no acute distress.  Resting comfortably  Head is normocephalic, atraumatic.  No labored breathing.  Speech is clear and coherent with logical content.  Patient is alert  and oriented at baseline.    Assessment and Plan: 1. Medication side effect - hydrochlorothiazide (HYDRODIURIL) 25 MG tablet; Take 1 tablet (25 mg total) by mouth daily.  Dispense: 7 tablet; Refill: 0  2. Elevated blood pressure reading - hydrochlorothiazide (HYDRODIURIL) 25 MG tablet; Take 1 tablet (25 mg total) by mouth daily.  Dispense: 7 tablet; Refill: 0  - Suspect cortisone flush and elevated blood pressure from recent steroid injection - HCTZ for 5-7 days while steroid dissipates - Limit salt intake - Push fluids - Advised if BP still elevated after a week and once HCTZ has been stopped, she should follow up with her PCP for further evaluation   Follow Up Instructions: I discussed the assessment and treatment plan with the patient. The patient was provided an opportunity to ask questions and all were answered. The patient agreed with the plan and demonstrated an understanding of the instructions.  A copy of instructions were sent to the patient via MyChart unless otherwise noted  below.    The patient was advised to call back or seek an in-person evaluation if the symptoms worsen or if the condition fails to improve as anticipated.    Margaretann Loveless, PA-C

## 2023-10-28 NOTE — Patient Instructions (Signed)
Silvana Newness Cavallero, thank you for joining Margaretann Loveless, PA-C for today's virtual visit.  While this provider is not your primary care provider (PCP), if your PCP is located in our provider database this encounter information will be shared with them immediately following your visit.   A [facility] MyChart account gives you access to today's visit and all your visits, tests, and labs performed at [facility] " click here if you don't have a [facility] MyChart account or go to [email or web address]  Consent: (Patient) Nichole Gonzalez provided verbal consent for this virtual visit at the beginning of the encounter.  Current Medications:  Current Outpatient Medications:    hydrochlorothiazide (HYDRODIURIL) 25 MG tablet, Take 1 tablet (25 mg total) by mouth daily., Disp: 7 tablet, Rfl: 0   Ascorbic Acid (VITAMIN C PO), Take by mouth., Disp: , Rfl:    atorvastatin (LIPITOR) 10 MG tablet, Take 1 tablet (10 mg total) by mouth daily., Disp: 30 tablet, Rfl: 2   buPROPion (WELLBUTRIN XL) 150 MG 24 hr tablet, Take 3 tablets (450 mg total) by mouth daily., Disp: 270 tablet, Rfl: 0   celecoxib (CELEBREX) 100 MG capsule, Take 1 capsule (100 mg total) by mouth 2 (two) times daily., Disp: 20 capsule, Rfl: 0   cetirizine (ZYRTEC ALLERGY) 10 MG tablet, Take 1 tablet (10 mg total) by mouth daily., Disp: 90 tablet, Rfl: 3   citalopram (CELEXA) 40 MG tablet, Take 1 tablet (40 mg total) by mouth daily., Disp: 90 tablet, Rfl: 1   cyclobenzaprine (FLEXERIL) 10 MG tablet, Take 1 tablet (10 mg total) by mouth at bedtime., Disp: 90 tablet, Rfl: 0   EPIPEN 2-PAK 0.3 MG/0.3ML SOAJ injection, , Disp: , Rfl: 0   fluticasone (FLONASE) 50 MCG/ACT nasal spray, Place 1 spray into both nostrils once daily., Disp: 48 g, Rfl: 3   hydrOXYzine (VISTARIL) 25 MG capsule, Take 1 capsule (25 mg total) by mouth every 8 (eight) hours as needed., Disp: 90 capsule, Rfl: 0   LORazepam (ATIVAN) 0.5 MG tablet,  Take 1 tablet (0.5 mg total) by mouth 2 (two) times daily as needed (panic attacks)., Disp: 30 tablet, Rfl: 1   omeprazole (PRILOSEC) 40 MG capsule, Take 1 capsule (40 mg total) by mouth daily., Disp: 90 capsule, Rfl: 0   potassium chloride (KLOR-CON) 10 MEQ tablet, Take 1 tablet (10 mEq total) by mouth daily., Disp: 30 tablet, Rfl: 3   topiramate (TOPAMAX) 100 MG tablet, Take 1 tablet (100 mg total) by mouth daily., Disp: 90 tablet, Rfl: 0   traZODone (DESYREL) 100 MG tablet, Take 1 tablet (100 mg total) by mouth at bedtime., Disp: 90 tablet, Rfl: 0   Medications ordered in this encounter:  Meds ordered this encounter  Medications   hydrochlorothiazide (HYDRODIURIL) 25 MG tablet    Sig: Take 1 tablet (25 mg total) by mouth daily.    Dispense:  7 tablet    Refill:  0    Order Specific Question:   Supervising Provider    Answer:   Merrilee Jansky [id number]     *If you need refills on other medications prior to your next appointment, please contact your pharmacy*  Follow-Up: Call back or seek an in-person evaluation if the symptoms worsen or if the condition fails to improve as anticipated.  [facility] [phone]  Other Instructions  Hydrochlorothiazide Capsules or Tablets What is this medication? HYDROCHLOROTHIAZIDE (hye droe klor oh THYE a zide) treats high blood pressure. It may  also be used to reduce swelling related to heart, kidney, or liver disease. It helps your kidneys remove more fluid and salt from your blood through the urine. It belongs to a group of medications called diuretics. This medicine may be used for other purposes; ask your health care provider or pharmacist if you have questions. COMMON BRAND NAME(S): Esidrix, Ezide, HydroDIURIL, Microzide, Oretic, Zide What should I tell my care team before I take this medication? They need to know if you have any of these conditions: Diabetes Gout Kidney disease Liver disease Lupus Pancreatitis An  unusual or allergic reaction to hydrochlorothiazide, other medications, foods, dyes, or preservatives Pregnant or trying to get pregnant Breastfeeding How should I use this medication? Take this medication by mouth. Take it as directed on the prescription label at the same time every day. You can take it with or without food. If it upsets your stomach, take it with food. Keep taking it unless your care team tells you to stop. Talk to your care team about the use of this medication in children. While it may be prescribed for children as young as newborns for selected conditions, precautions do apply. Overdosage: If you think you have taken too much of this medicine contact a poison control center or emergency room at once. NOTE: This medicine is only for you. Do not share this medicine with others. What if I miss a dose? If you miss a dose, take it as soon as you can. If it is almost time for your next dose, take only that dose. Do not take double or extra doses. What may interact with this medication? Cholestyramine Colestipol Digoxin Dofetilide Lithium Medications for blood pressure Medications for diabetes Medications that relax muscles for surgery Other diuretics Steroid medications, such as prednisone or cortisone This list may not describe all possible interactions. Give your health care provider a list of all the medicines, herbs, non-prescription drugs, or dietary supplements you use. Also tell them if you smoke, drink alcohol, or use illegal drugs. Some items may interact with your medicine. What should I watch for while using this medication? Visit your care team for regular checks on your progress. Check your blood pressure as directed. Know what your blood pressure should be and when to contact your care team. Do not treat yourself for coughs, colds, or pain while you are using this medication without asking your care team for advice. Some medications may increase your blood  pressure. This medication may affect your coordination, reaction time, or judgment. Do not drive or operate machinery until you know how this medication affects you. Sit up or stand slowly to reduce the risk of dizzy or fainting spells. Drinking alcohol with this medication can increase the risk of these side effects. Talk to your care team about your risk of skin cancer. You may be more at risk for skin cancer if you take this medication. This medication can make you more sensitive to the sun. Keep out of the sun. If you cannot avoid being in the sun, wear protective clothing and use sunscreen. Do not use sun lamps or tanning beds/booths. You may need to be on a special diet while taking this medication. Ask your care team. Also, find out how many glasses of fluids you need to drink each day. Check with your care team if you get an attack of severe diarrhea, nausea and vomiting, or if you sweat a lot. The loss of too much body fluid can make it dangerous for  you to take this medication. This medication may increase blood sugar. Ask your care team if changes in diet or medications are needed if you have diabetes. What side effects may I notice from receiving this medication? Side effects that you should report to your care team as soon as possible: Allergic reactions--skin rash, itching, hives, swelling of the face, lips, tongue, or throat Dehydration--increased thirst, dry mouth, feeling faint or lightheaded, headache, dark yellow or brown urine Gout--severe pain, redness, warmth, or swelling in joints, such as the big toe Kidney injury--decrease in the amount of urine, swelling of the ankles, hands, or feet Low blood pressure--dizziness, feeling faint or lightheaded, blurry vision Low potassium level--muscle pain or cramps, unusual weakness, fatigue, fast or irregular heartbeat, constipation Sudden eye pain or change in vision such as blurred vision, seeing halos around lights, vision loss Side  effects that usually do not require medical attention (report to your care team if they continue or are bothersome): Change in sex drive or performance Headache Upset stomach This list may not describe all possible side effects. Call your doctor for medical advice about side effects. You may report side effects to FDA at 1-800-FDA-1088. Where should I keep my medication? Keep out of the reach of children and pets. Store at room temperature between 20 and 25 degrees C (68 and 77 degrees F). Protect from light and moisture. Keep the container tightly closed. Do not freeze. Get rid of any unused medication after the expiration date. To get rid of medications that are no longer needed or have expired: Take the medication to a medication take-back program. Check with your pharmacy or law enforcement to find a location. If you cannot return the medication, check the label or package insert to see if the medication should be thrown out in the garbage or flushed down the toilet. If you are not sure, ask your care team. If it is safe to put in the trash, empty the medication out of the container. Mix the medication with cat litter, dirt, coffee grounds, or other unwanted substance. Seal the mixture in a bag or container. Put it in the trash. NOTE: This sheet is a summary. It may not cover all possible information. If you have questions about this medicine, talk to your doctor, pharmacist, or health care provider.  2024 Elsevier/Gold Standard (2022-07-06 00:00:00)    If you have been instructed to have an in-person evaluation today at a local Urgent Care facility, please use the link below. It will take you to a list of all of our available [facility], including address, phone number and hours of operation. Please do not delay care.  [facility]  If you or a family member do not have a primary care provider, use the link below to schedule a visit and establish care. When you  choose a [facility] primary care physician or advanced practice provider, you gain a long-term partner in health. Find a Primary Care Provider  Learn more about [facility]'s in-office and virtual care options: [facility] - Get Care Now

## 2023-10-31 DIAGNOSIS — G4733 Obstructive sleep apnea (adult) (pediatric): Secondary | ICD-10-CM | POA: Insufficient documentation

## 2023-10-31 DIAGNOSIS — Z7952 Long term (current) use of systemic steroids: Secondary | ICD-10-CM | POA: Insufficient documentation

## 2023-11-18 ENCOUNTER — Other Ambulatory Visit: Payer: Self-pay | Admitting: Family Medicine

## 2023-11-18 ENCOUNTER — Other Ambulatory Visit: Payer: Self-pay

## 2023-11-18 ENCOUNTER — Other Ambulatory Visit: Payer: Self-pay | Admitting: Cardiology

## 2023-11-18 MED ORDER — POTASSIUM CHLORIDE ER 10 MEQ PO TBCR
10.0000 meq | EXTENDED_RELEASE_TABLET | Freq: Every day | ORAL | 3 refills | Status: DC
  Filled 2023-11-18: qty 30, 30d supply, fill #0
  Filled 2023-12-23: qty 30, 30d supply, fill #1
  Filled 2024-01-22: qty 30, 30d supply, fill #2
  Filled 2024-02-21: qty 30, 30d supply, fill #3

## 2023-11-18 MED ORDER — TOPIRAMATE 100 MG PO TABS
100.0000 mg | ORAL_TABLET | Freq: Every day | ORAL | 0 refills | Status: DC
  Filled 2023-11-18: qty 90, 90d supply, fill #0

## 2023-11-19 ENCOUNTER — Other Ambulatory Visit: Payer: Self-pay | Admitting: Family Medicine

## 2023-11-19 ENCOUNTER — Other Ambulatory Visit: Payer: Self-pay

## 2023-11-19 DIAGNOSIS — T887XXA Unspecified adverse effect of drug or medicament, initial encounter: Secondary | ICD-10-CM

## 2023-11-19 DIAGNOSIS — R030 Elevated blood-pressure reading, without diagnosis of hypertension: Secondary | ICD-10-CM

## 2023-11-20 ENCOUNTER — Other Ambulatory Visit: Payer: Self-pay

## 2023-11-25 ENCOUNTER — Other Ambulatory Visit: Payer: Self-pay

## 2023-11-26 ENCOUNTER — Other Ambulatory Visit: Payer: Self-pay | Admitting: Family Medicine

## 2023-11-26 ENCOUNTER — Other Ambulatory Visit: Payer: Self-pay

## 2023-11-26 DIAGNOSIS — T887XXA Unspecified adverse effect of drug or medicament, initial encounter: Secondary | ICD-10-CM

## 2023-11-26 DIAGNOSIS — R030 Elevated blood-pressure reading, without diagnosis of hypertension: Secondary | ICD-10-CM

## 2023-11-26 MED FILL — Hydrochlorothiazide Tab 25 MG: ORAL | 30 days supply | Qty: 30 | Fill #0 | Status: AC

## 2023-12-09 ENCOUNTER — Other Ambulatory Visit: Payer: Self-pay | Admitting: Family Medicine

## 2023-12-09 ENCOUNTER — Other Ambulatory Visit: Payer: Self-pay

## 2023-12-09 MED ORDER — OMEPRAZOLE 40 MG PO CPDR
40.0000 mg | DELAYED_RELEASE_CAPSULE | Freq: Every day | ORAL | 0 refills | Status: DC
  Filled 2023-12-09: qty 90, 90d supply, fill #0

## 2023-12-12 ENCOUNTER — Other Ambulatory Visit: Payer: Self-pay | Admitting: Family Medicine

## 2023-12-12 ENCOUNTER — Other Ambulatory Visit: Payer: Self-pay

## 2023-12-12 DIAGNOSIS — F5101 Primary insomnia: Secondary | ICD-10-CM

## 2023-12-12 DIAGNOSIS — F332 Major depressive disorder, recurrent severe without psychotic features: Secondary | ICD-10-CM

## 2023-12-12 MED ORDER — ATORVASTATIN CALCIUM 10 MG PO TABS
10.0000 mg | ORAL_TABLET | Freq: Every day | ORAL | 2 refills | Status: DC
  Filled 2023-12-12: qty 30, 30d supply, fill #0

## 2023-12-12 MED ORDER — BUPROPION HCL ER (XL) 150 MG PO TB24
450.0000 mg | ORAL_TABLET | Freq: Every day | ORAL | 0 refills | Status: DC
  Filled 2023-12-12: qty 270, 90d supply, fill #0

## 2023-12-12 MED ORDER — TRAZODONE HCL 100 MG PO TABS
100.0000 mg | ORAL_TABLET | Freq: Every day | ORAL | 0 refills | Status: DC
  Filled 2023-12-12: qty 90, 90d supply, fill #0

## 2023-12-12 MED ORDER — CITALOPRAM HYDROBROMIDE 40 MG PO TABS
40.0000 mg | ORAL_TABLET | Freq: Every day | ORAL | 1 refills | Status: DC
  Filled 2023-12-12: qty 90, 90d supply, fill #0
  Filled 2024-03-20: qty 90, 90d supply, fill #1

## 2023-12-27 ENCOUNTER — Encounter: Payer: Self-pay | Admitting: Physician Assistant

## 2023-12-30 ENCOUNTER — Other Ambulatory Visit: Payer: Self-pay | Admitting: Physician Assistant

## 2023-12-30 DIAGNOSIS — E782 Mixed hyperlipidemia: Secondary | ICD-10-CM

## 2023-12-30 DIAGNOSIS — R030 Elevated blood-pressure reading, without diagnosis of hypertension: Secondary | ICD-10-CM

## 2023-12-30 DIAGNOSIS — R739 Hyperglycemia, unspecified: Secondary | ICD-10-CM

## 2023-12-30 DIAGNOSIS — E0590 Thyrotoxicosis, unspecified without thyrotoxic crisis or storm: Secondary | ICD-10-CM

## 2024-01-03 ENCOUNTER — Ambulatory Visit: Payer: Commercial Managed Care - PPO

## 2024-01-03 DIAGNOSIS — R739 Hyperglycemia, unspecified: Secondary | ICD-10-CM | POA: Diagnosis not present

## 2024-01-03 DIAGNOSIS — R059 Cough, unspecified: Secondary | ICD-10-CM

## 2024-01-03 DIAGNOSIS — E0590 Thyrotoxicosis, unspecified without thyrotoxic crisis or storm: Secondary | ICD-10-CM

## 2024-01-03 DIAGNOSIS — R030 Elevated blood-pressure reading, without diagnosis of hypertension: Secondary | ICD-10-CM

## 2024-01-03 DIAGNOSIS — E782 Mixed hyperlipidemia: Secondary | ICD-10-CM

## 2024-01-04 LAB — COMPREHENSIVE METABOLIC PANEL
ALT: 26 [IU]/L (ref 0–32)
AST: 22 [IU]/L (ref 0–40)
Albumin: 4.4 g/dL (ref 3.9–4.9)
Alkaline Phosphatase: 92 [IU]/L (ref 44–121)
BUN/Creatinine Ratio: 7 — ABNORMAL LOW (ref 9–23)
BUN: 7 mg/dL (ref 6–24)
Bilirubin Total: 0.3 mg/dL (ref 0.0–1.2)
CO2: 20 mmol/L (ref 20–29)
Calcium: 9.5 mg/dL (ref 8.7–10.2)
Chloride: 103 mmol/L (ref 96–106)
Creatinine, Ser: 0.94 mg/dL (ref 0.57–1.00)
Globulin, Total: 2.4 g/dL (ref 1.5–4.5)
Glucose: 85 mg/dL (ref 70–99)
Potassium: 4.2 mmol/L (ref 3.5–5.2)
Sodium: 140 mmol/L (ref 134–144)
Total Protein: 6.8 g/dL (ref 6.0–8.5)
eGFR: 74 mL/min/{1.73_m2} (ref >59)

## 2024-01-04 LAB — LIPID PANEL
Chol/HDL Ratio: 3.9 {ratio} (ref 0.0–4.4)
Cholesterol, Total: 170 mg/dL (ref 100–199)
HDL: 44 mg/dL (ref >39)
LDL Chol Calc (NIH): 100 mg/dL — ABNORMAL HIGH (ref 0–99)
Triglycerides: 149 mg/dL (ref 0–149)
VLDL Cholesterol Cal: 26 mg/dL (ref 5–40)

## 2024-01-04 LAB — HEMOGLOBIN A1C
Est. average glucose Bld gHb Est-mCnc: 111 mg/dL
Hgb A1c MFr Bld: 5.5 % (ref 4.8–5.6)

## 2024-01-04 LAB — CBC WITH DIFFERENTIAL/PLATELET
Basophils Absolute: 0 10*3/uL (ref 0.0–0.2)
Basos: 0 %
EOS (ABSOLUTE): 0.1 10*3/uL (ref 0.0–0.4)
Eos: 1 %
Hematocrit: 44.3 % (ref 34.0–46.6)
Hemoglobin: 14.5 g/dL (ref 11.1–15.9)
Immature Grans (Abs): 0 10*3/uL (ref 0.0–0.1)
Immature Granulocytes: 0 %
Lymphocytes Absolute: 3.9 10*3/uL — ABNORMAL HIGH (ref 0.7–3.1)
Lymphs: 40 %
MCH: 31.6 pg (ref 26.6–33.0)
MCHC: 32.7 g/dL (ref 31.5–35.7)
MCV: 97 fL (ref 79–97)
Monocytes Absolute: 0.5 10*3/uL (ref 0.1–0.9)
Monocytes: 5 %
Neutrophils Absolute: 5.4 10*3/uL (ref 1.4–7.0)
Neutrophils: 54 %
Platelets: 210 10*3/uL (ref 150–450)
RBC: 4.59 x10E6/uL (ref 3.77–5.28)
RDW: 11.8 % (ref 11.7–15.4)
WBC: 10 10*3/uL (ref 3.4–10.8)

## 2024-01-04 LAB — TSH: TSH: 0.688 u[IU]/mL (ref 0.450–4.500)

## 2024-01-04 LAB — T4, FREE: Free T4: 1.05 ng/dL (ref 0.82–1.77)

## 2024-01-06 NOTE — Progress Notes (Signed)
 Subjective:  Patient ID: Nichole Gonzalez, female    DOB: 1976-12-20  Age: 48 y.o. MRN: [id number]  Chief Complaint  Patient presents with   Medical Management of Chronic Issues    HPI   Gastroesophageal reflux disease, unspecified whether esophagitis present Omeprazole  40 mg daily. Controlled.   Mixed hyperlipidemia Was on Atorvastatin  10 mg daily. Decreased appetite. Eats breakfast and lunch. Eats dinner sometimes.  Depression, major, recurrent, mild  Wellbutrin  150 mg 3 in am, trazodone  100 mg at bedtime and Celexa  40 mg once daily . Ativan  and Vistaril  as needed. Rarely needs/takes lorazepam  or vistaril .    Neck Pain: taking celebrex  100 mg once at night and tylenol . Helps some.    Migraines: On topamax  100 mg daily. Neck still causes some headaches.   Discussed the use of AI scribe software for clinical note transcription with the patient, who gave verbal consent to proceed.  History of Present Illness   The patient is a 48 year old female with a history of chronic knee pain, which she reports has been a problem since her childhood following an injury. The pain is described as a pulling sensation, particularly when descending stairs, and occasionally results in the knee feeling like it's going to give out. She has previously tried physical therapy and cortisone injections for this issue, but the relief was temporary. The most recent cortisone injection resulted in a significant increase in her blood pressure, requiring medical intervention.  In addition to her knee pain, the patient reports persistent dry mouth, which has not been alleviated by mouthwashes or changes in medication. She also experiences daily nasal congestion, which she manages with Flonase . The patient has a desire to lose weight and has tried various methods, including phentermine  and dietary changes, with limited success. She expresses interest in the medication Zepbound  for weight loss and management of  her sleep apnea.     Knee pain she says is worse when she would go down stairs. Denies it locking or giving out.      01/07/2024    8:53 AM 10/04/2023    8:35 AM 06/13/2023    8:37 AM 02/26/2023    8:32 AM 01/03/2023    7:41 AM  Depression screen PHQ 2/9  Decreased Interest 0 1 1 2  0  Down, Depressed, Hopeless 0 0 3 2 0  PHQ - 2 Score 0 1 4 4  0  Altered sleeping 0 0 1 3 1   Tired, decreased energy 2 1 1 3 2   Change in appetite 3 1 3  0 1  Feeling bad or failure about yourself  0 0 3 0 0  Trouble concentrating 0 0 3 3 1   Moving slowly or fidgety/restless 0 0 2 0 0  Suicidal thoughts 0 0 0 0 0  PHQ-9 Score 5 3 17 13 5   Difficult doing work/chores Somewhat difficult Not difficult at all Very difficult Somewhat difficult Somewhat difficult        01/07/2024    8:53 AM  Fall Risk   Falls in the past year? 0  Number falls in past yr: 0  Injury with Fall? 0  Risk for fall due to : No Fall Risks  Follow up Falls evaluation completed    Patient Care Team: Milon Cleaves, [place] as PCP - General (Physician Assistant)   Review of Systems  Constitutional:  Negative for chills, diaphoresis and fever.  HENT:  Positive for congestion and rhinorrhea. Negative for ear discharge, ear pain, sinus pressure and sore throat.  Eyes:  Negative for pain and redness.  Respiratory:  Negative for cough, chest tightness, shortness of breath and wheezing.   Cardiovascular:  Negative for chest pain and palpitations.  Gastrointestinal:  Negative for abdominal pain, blood in stool, diarrhea and nausea.  Genitourinary:  Negative for flank pain, frequency and hematuria.  Musculoskeletal:  Positive for arthralgias. Negative for joint swelling and myalgias.  Neurological:  Negative for dizziness, weakness, numbness and headaches.  Psychiatric/Behavioral:  Negative for agitation, behavioral problems and dysphoric mood. The patient is not nervous/anxious.     Current Outpatient Medications on File Prior to Visit   Medication Sig Dispense Refill   Ascorbic Acid (VITAMIN C PO) Take by mouth.     buPROPion  (WELLBUTRIN  XL) 150 MG 24 hr tablet Take 3 tablets (450 mg total) by mouth daily. 270 tablet 0   celecoxib  (CELEBREX ) 100 MG capsule Take 1 capsule (100 mg total) by mouth 2 (two) times daily. 20 capsule 0   cetirizine  (ZYRTEC  ALLERGY) 10 MG tablet Take 1 tablet (10 mg total) by mouth daily. 90 tablet 3   citalopram  (CELEXA ) 40 MG tablet Take 1 tablet (40 mg total) by mouth daily. 90 tablet 1   cyclobenzaprine  (FLEXERIL ) 10 MG tablet Take 1 tablet (10 mg total) by mouth at bedtime. 90 tablet 0   EPIPEN 2-PAK 0.3 MG/0.3ML SOAJ injection   0   fluticasone  (FLONASE ) 50 MCG/ACT nasal spray Place 1 spray into both nostrils once daily. 48 g 3   hydrochlorothiazide  (HYDRODIURIL ) 25 MG tablet Take 1 tablet (25 mg total) by mouth daily. 30 tablet 2   hydrOXYzine  (VISTARIL ) 25 MG capsule Take 1 capsule (25 mg total) by mouth every 8 (eight) hours as needed. 90 capsule 0   LORazepam  (ATIVAN ) 0.5 MG tablet Take 1 tablet (0.5 mg total) by mouth 2 (two) times daily as needed (panic attacks). 30 tablet 1   omeprazole  (PRILOSEC) 40 MG capsule Take 1 capsule (40 mg total) by mouth daily. 90 capsule 0   potassium chloride  (KLOR-CON ) 10 MEQ tablet Take 1 tablet (10 mEq total) by mouth daily. 30 tablet 3   topiramate  (TOPAMAX ) 100 MG tablet Take 1 tablet (100 mg total) by mouth daily. 90 tablet 0   traZODone  (DESYREL ) 100 MG tablet Take 1 tablet (100 mg total) by mouth at bedtime. 90 tablet 0   No current facility-administered medications on file prior to visit.   Past Medical History:  Diagnosis Date   Abnormal thyroid  stimulating hormone (TSH) level 02/17/2015   Abnormal uterine bleeding 10/07/2020   Allergic rhinitis 09/03/2015   Depression, recurrent (HCC) 09/08/2020   Gastroesophageal reflux disease 10/27/2020   High risk sexual behavior 11/06/2020   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   Primary insomnia 09/08/2020    Tobacco abuse 09/03/2015   Past Surgical History:  Procedure Laterality Date   ABDOMINAL HYSTERECTOMY     APPENDECTOMY     CHOLECYSTECTOMY     DILATION AND CURETTAGE, DIAGNOSTIC / THERAPEUTIC     JOINT REPLACEMENT     KNEE ARTHROSCOPY     right foot     TUBAL LIGATION      Family History  Problem Relation Age of Onset   Thyroid  disease Sister    Coronary artery disease Father    Heart attack Father    COPD Sister    Diabetes Mellitus II Sister    Social History   Socioeconomic History   Marital status: Married    Spouse name: Not on file   Number  of children: 3   Years of education: Not on file   Highest education level: Bachelor's degree (e.g., BA, AB, BS)  Occupational History   Not on file  Tobacco Use   Smoking status: Former    Current packs/day: 1.00    Average packs/day: 1 pack/day for 30.0 years (30.0 ttl pk-yrs)    Types: Cigarettes    Passive exposure: Never   Smokeless tobacco: Never  Vaping Use   Vaping status: Never Used  Substance and Sexual Activity   Alcohol use: Yes    Alcohol/week: 8.0 standard drinks of alcohol    Types: 8 Standard drinks or equivalent per week    Comment: socially   Drug use: Never   Sexual activity: Not on file  Other Topics Concern   Not on file  Social History Narrative   ** Merged History Encounter **       Social Drivers of Health   Financial Resource Strain: Low Risk  (01/07/2024)   Overall Financial Resource Strain (CARDIA)    Difficulty of Paying Living Expenses: Not very hard  Food Insecurity: No Food Insecurity (01/07/2024)   Hunger Vital Sign    Worried About Running Out of Food in the Last Year: Never true    Ran Out of Food in the Last Year: Never true  Transportation Needs: No Transportation Needs (01/07/2024)   PRAPARE - Administrator, Civil Service (Medical): No    Lack of Transportation (Non-Medical): No  Physical Activity: Inactive (01/07/2024)   Exercise Vital Sign    Days of Exercise  per Week: 0 days    Minutes of Exercise per Session: 30 min  Stress: Stress Concern Present (01/07/2024)   [employer] of Occupational Health - Occupational Stress Questionnaire    Feeling of Stress : To some extent  Social Connections: Moderately Isolated (01/07/2024)   Social Connection and Isolation Panel [NHANES]    Frequency of Communication with Friends and Family: More than three times a week    Frequency of Social Gatherings with Friends and Family: More than three times a week    Attends Religious Services: Never    Database Administrator or Organizations: No    Attends Engineer, Structural: Not on file    Marital Status: Married    Objective:  BP 102/70 (BP Location: Left Arm, Patient Position: Sitting, Cuff Size: Large)   Pulse 80   Temp 97.7 F (36.5 C) (Temporal)   Resp 16   Ht 5' 6 (1.676 m)   Wt 218 lb 12.8 oz (99.2 kg)   LMP 01/04/2016 (Approximate)   SpO2 98%   BMI 35.32 kg/m      01/07/2024    8:51 AM 10/25/2023    8:51 AM 10/18/2023    9:43 AM  BP/Weight  Systolic BP 102 130 133  Diastolic BP 70 76 97  Wt. (Lbs) 218.8 222 221.7  BMI 35.32 kg/m2 35.83 kg/m2 35.78 kg/m2    Physical Exam Vitals reviewed.  Constitutional:      Appearance: Normal appearance.  Cardiovascular:     Rate and Rhythm: Normal rate and regular rhythm.     Heart sounds: Normal heart sounds.  Pulmonary:     Effort: Pulmonary effort is normal.     Breath sounds: Normal breath sounds.  Abdominal:     General: Bowel sounds are normal.     Palpations: Abdomen is soft.     Tenderness: There is no abdominal tenderness.  Musculoskeletal:  Right knee: No swelling, deformity, erythema, bony tenderness or crepitus. Tenderness present.     Left knee: Normal.     Comments: Quad tendon tenderness just superior to the patella  Neurological:     Mental Status: She is alert and oriented to person, place, and time.  Psychiatric:        Mood and Affect: Mood normal.         Behavior: Behavior normal.     Diabetic Foot Exam - Simple   No data filed      Lab Results  Component Value Date   WBC 10.0 01/03/2024   HGB 14.5 01/03/2024   HCT 44.3 01/03/2024   PLT 210 01/03/2024   GLUCOSE 85 01/03/2024   CHOL 170 01/03/2024   TRIG 149 01/03/2024   HDL 44 01/03/2024   LDLCALC 100 (H) 01/03/2024   ALT 26 01/03/2024   AST 22 01/03/2024   NA 140 01/03/2024   K 4.2 01/03/2024   CL 103 01/03/2024   CREATININE 0.94 01/03/2024   BUN 7 01/03/2024   CO2 20 01/03/2024   TSH 0.688 01/03/2024   HGBA1C 5.5 01/03/2024      Assessment & Plan:    Mixed hyperlipidemia Assessment & Plan: LDL slightly elevated despite Lipitor use. Family history of heart disease. -Increase Lipitor dose, monitor cholesterol levels at next visit.  Orders: -     Zepbound ; Inject 2.5 mg into the skin once a week.  Dispense: 2 mL; Refill: 1 -     Atorvastatin  Calcium ; Take 1 tablet (20 mg total) by mouth daily.  Dispense: 90 tablet; Refill: 3  Mild recurrent major depression (HCC) Assessment & Plan: Controlled Denies any new or worsening symptoms Continue taking Wellbutrin  150mg , Lorazapam .5mg , Celexa  40mg  Will adjust depending on symptoms   OSA (obstructive sleep apnea) Assessment & Plan: Patient has tried phentermine  with limited success, and has difficulty with exercise due to knee pain. -Consider Zepbound  for weight management, pending insurance approval. Monitor weight and cholesterol levels.  Orders: -     Zepbound ; Inject 2.5 mg into the skin once a week.  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 Patient feels constantly dehydrated despite adequate fluid intake. -Prescribe Pilocarpine for dry mouth, monitor for side effects including night sweats.  Orders: -     Cevimeline  HCl; Take 1 capsule (30 mg total) by mouth 3  (three) times daily.  Dispense: 90 capsule; Refill: 3  Severe obesity (BMI 35.0-35.9 with comorbidity) (HCC) Assessment & Plan: Patient has tried phentermine  with limited success, and has difficulty with exercise due to knee pain. -Consider Zepbound  for weight management, pending insurance approval. Monitor weight and cholesterol levels.  Orders: -     Zepbound ; Inject 2.5 mg into the skin once a week.  Dispense: 2 mL; Refill: 1  Arthralgia of both knees Assessment & Plan: Persistent pain and instability, with a history of cortisone injection providing temporary relief. No recent falls or trauma. Patient has tried physical therapy and weight loss with limited success. -Refer to orthopedic specialist for further evaluation and potential interventions such as arthroscopy or knee replacement.  Orders: -     Ambulatory referral to Orthopedic Surgery  Sinus congestion Assessment & Plan: Chronic issue, daily symptoms. Currently using Flonase  twice daily. -Monitor symptoms, consider evaluation for sinus infection if symptoms worsen.          Meds  ordered this encounter  Medications   tirzepatide  (ZEPBOUND ) 2.5 MG/0.5ML Pen    Sig: Inject 2.5 mg into the skin once a week.    Dispense:  2 mL    Refill:  1   cevimeline  (EVOXAC ) 30 MG capsule    Sig: Take 1 capsule (30 mg total) by mouth 3 (three) times daily.    Dispense:  90 capsule    Refill:  3   atorvastatin  (LIPITOR) 20 MG tablet    Sig: Take 1 tablet (20 mg total) by mouth daily.    Dispense:  90 tablet    Refill:  3    Orders Placed This Encounter  Procedures   Ambulatory referral to Orthopedic Surgery     Follow-up: Return in about 3 months (around 04/06/2024) for Chronic, Nola.   I,Marla I Leal-Borjas,acting as a scribe for [employer], PA.,have documented all relevant documentation on the behalf of Nola Angles, PA,as directed by  Nola Angles, PA while in the presence of Nola Angles, [place].   An After Visit Summary  was printed and given to the patient.  Nola Angles, [place] [facility] [phone]

## 2024-01-07 ENCOUNTER — Ambulatory Visit: Payer: Commercial Managed Care - PPO | Admitting: Physician Assistant

## 2024-01-07 ENCOUNTER — Other Ambulatory Visit: Payer: Self-pay

## 2024-01-07 VITALS — BP 102/70 | HR 80 | Temp 97.7°F | Resp 16 | Ht 66.0 in | Wt 218.8 lb

## 2024-01-07 DIAGNOSIS — K117 Disturbances of salivary secretion: Secondary | ICD-10-CM | POA: Diagnosis not present

## 2024-01-07 DIAGNOSIS — G4733 Obstructive sleep apnea (adult) (pediatric): Secondary | ICD-10-CM

## 2024-01-07 DIAGNOSIS — M25561 Pain in right knee: Secondary | ICD-10-CM | POA: Diagnosis not present

## 2024-01-07 DIAGNOSIS — R0981 Nasal congestion: Secondary | ICD-10-CM | POA: Diagnosis not present

## 2024-01-07 DIAGNOSIS — M25562 Pain in left knee: Secondary | ICD-10-CM

## 2024-01-07 DIAGNOSIS — E782 Mixed hyperlipidemia: Secondary | ICD-10-CM

## 2024-01-07 DIAGNOSIS — Z6835 Body mass index (BMI) 35.0-35.9, adult: Secondary | ICD-10-CM | POA: Diagnosis not present

## 2024-01-07 DIAGNOSIS — F330 Major depressive disorder, recurrent, mild: Secondary | ICD-10-CM | POA: Diagnosis not present

## 2024-01-07 MED ORDER — ATORVASTATIN CALCIUM 20 MG PO TABS
20.0000 mg | ORAL_TABLET | Freq: Every day | ORAL | 3 refills | Status: DC
  Filled 2024-01-07: qty 90, 90d supply, fill #0
  Filled 2024-04-19: qty 90, 90d supply, fill #1
  Filled 2024-07-21: qty 90, 90d supply, fill #2
  Filled 2024-10-18: qty 90, 90d supply, fill #3

## 2024-01-07 MED ORDER — ZEPBOUND 2.5 MG/0.5ML ~~LOC~~ SOAJ
2.5000 mg | SUBCUTANEOUS | 1 refills | Status: DC
  Filled 2024-01-07 – 2024-01-08 (×2): qty 2, 28d supply, fill #0

## 2024-01-07 MED ORDER — CEVIMELINE HCL 30 MG PO CAPS
30.0000 mg | ORAL_CAPSULE | Freq: Three times a day (TID) | ORAL | 3 refills | Status: DC
  Filled 2024-01-07: qty 90, 30d supply, fill #0
  Filled 2024-02-19 – 2024-02-21 (×2): qty 90, 30d supply, fill #1
  Filled 2024-03-20: qty 90, 30d supply, fill #2
  Filled 2024-05-19: qty 90, 30d supply, fill #3

## 2024-01-07 NOTE — Assessment & Plan Note (Signed)
 Persistent pain and instability, with a history of cortisone injection providing temporary relief. No recent falls or trauma. Patient has tried physical therapy and weight loss with limited success. -Refer to orthopedic specialist for further evaluation and potential interventions such as arthroscopy or knee replacement.

## 2024-01-07 NOTE — Assessment & Plan Note (Signed)
 LDL slightly elevated despite Lipitor use. Family history of heart disease. -Increase Lipitor dose, monitor cholesterol levels at next visit.

## 2024-01-07 NOTE — Assessment & Plan Note (Signed)
 Patient has tried phentermine with limited success, and has difficulty with exercise due to knee pain. -Consider Zepbound for weight management, pending insurance approval. Monitor weight and cholesterol levels.

## 2024-01-07 NOTE — Assessment & Plan Note (Signed)
 Chronic issue, not resolved with hydration, mouth rinses, or medication adjustments. Patient feels constantly dehydrated despite adequate fluid intake. -Prescribe Pilocarpine for dry mouth, monitor for side effects including night sweats.

## 2024-01-07 NOTE — Assessment & Plan Note (Signed)
 Controlled Denies any new or worsening symptoms Continue taking Wellbutrin 150mg , Lorazapam .5mg , Celexa 40mg  Will adjust depending on symptoms

## 2024-01-07 NOTE — Assessment & Plan Note (Signed)
 Chronic issue, daily symptoms. Currently using Flonase twice daily. -Monitor symptoms, consider evaluation for sinus infection if symptoms worsen.

## 2024-01-08 ENCOUNTER — Encounter: Payer: Self-pay | Admitting: Physician Assistant

## 2024-01-08 ENCOUNTER — Other Ambulatory Visit: Payer: Self-pay

## 2024-01-08 ENCOUNTER — Other Ambulatory Visit: Payer: Self-pay | Admitting: Physician Assistant

## 2024-01-08 DIAGNOSIS — E782 Mixed hyperlipidemia: Secondary | ICD-10-CM

## 2024-01-08 MED FILL — Hydrochlorothiazide Tab 25 MG: ORAL | 30 days supply | Qty: 30 | Fill #1 | Status: AC

## 2024-01-15 ENCOUNTER — Other Ambulatory Visit: Payer: Self-pay

## 2024-01-15 MED ORDER — WEGOVY 0.25 MG/0.5ML ~~LOC~~ SOAJ
0.2500 mg | SUBCUTANEOUS | 0 refills | Status: DC
  Filled 2024-01-15: qty 2, 28d supply, fill #0

## 2024-01-22 ENCOUNTER — Encounter: Payer: Self-pay | Admitting: Physician Assistant

## 2024-01-28 DIAGNOSIS — M25562 Pain in left knee: Secondary | ICD-10-CM | POA: Diagnosis not present

## 2024-01-28 DIAGNOSIS — M25561 Pain in right knee: Secondary | ICD-10-CM | POA: Diagnosis not present

## 2024-01-28 DIAGNOSIS — M170 Bilateral primary osteoarthritis of knee: Secondary | ICD-10-CM | POA: Diagnosis not present

## 2024-02-03 ENCOUNTER — Other Ambulatory Visit: Payer: Self-pay

## 2024-02-03 ENCOUNTER — Other Ambulatory Visit: Payer: Self-pay | Admitting: Family Medicine

## 2024-02-03 DIAGNOSIS — M2559 Pain in other specified joint: Secondary | ICD-10-CM

## 2024-02-03 MED FILL — Hydrochlorothiazide Tab 25 MG: ORAL | 30 days supply | Qty: 30 | Fill #2 | Status: AC

## 2024-02-04 ENCOUNTER — Other Ambulatory Visit: Payer: Self-pay

## 2024-02-04 MED FILL — Cyclobenzaprine HCl Tab 10 MG: ORAL | 90 days supply | Qty: 90 | Fill #0 | Status: AC

## 2024-02-19 ENCOUNTER — Other Ambulatory Visit: Payer: Self-pay | Admitting: Family Medicine

## 2024-02-19 ENCOUNTER — Other Ambulatory Visit: Payer: Self-pay

## 2024-02-19 MED ORDER — TOPIRAMATE 100 MG PO TABS
100.0000 mg | ORAL_TABLET | Freq: Every day | ORAL | 0 refills | Status: DC
  Filled 2024-02-19: qty 90, 90d supply, fill #0

## 2024-02-21 ENCOUNTER — Other Ambulatory Visit: Payer: Self-pay

## 2024-02-26 ENCOUNTER — Encounter: Payer: Self-pay | Admitting: Physician Assistant

## 2024-02-27 ENCOUNTER — Other Ambulatory Visit: Payer: Self-pay | Admitting: Physician Assistant

## 2024-02-27 ENCOUNTER — Other Ambulatory Visit: Payer: Self-pay

## 2024-02-27 DIAGNOSIS — R059 Cough, unspecified: Secondary | ICD-10-CM

## 2024-02-27 MED ORDER — DOXYCYCLINE HYCLATE 100 MG PO TABS
100.0000 mg | ORAL_TABLET | Freq: Two times a day (BID) | ORAL | 0 refills | Status: DC
  Filled 2024-02-27: qty 10, 5d supply, fill #0

## 2024-03-02 ENCOUNTER — Other Ambulatory Visit: Payer: Self-pay | Admitting: Physician Assistant

## 2024-03-02 ENCOUNTER — Ambulatory Visit (INDEPENDENT_AMBULATORY_CARE_PROVIDER_SITE_OTHER)
Admission: RE | Admit: 2024-03-02 | Discharge: 2024-03-02 | Disposition: A | Discharge status: Home / Self Care | Source: Ambulatory Visit | Attending: Physician Assistant | Admitting: Physician Assistant

## 2024-03-02 DIAGNOSIS — R059 Cough, unspecified: Secondary | ICD-10-CM

## 2024-03-02 DIAGNOSIS — R918 Other nonspecific abnormal finding of lung field: Secondary | ICD-10-CM | POA: Diagnosis not present

## 2024-03-03 ENCOUNTER — Encounter: Payer: Self-pay | Admitting: Physician Assistant

## 2024-03-03 ENCOUNTER — Other Ambulatory Visit: Payer: Self-pay

## 2024-03-03 ENCOUNTER — Other Ambulatory Visit: Payer: Self-pay | Admitting: Physician Assistant

## 2024-03-03 ENCOUNTER — Other Ambulatory Visit

## 2024-03-03 ENCOUNTER — Ambulatory Visit
Admission: RE | Admit: 2024-03-03 | Discharge: 2024-03-03 | Disposition: A | Discharge status: Home / Self Care | Source: Ambulatory Visit | Attending: Physician Assistant | Admitting: Physician Assistant

## 2024-03-03 DIAGNOSIS — J189 Pneumonia, unspecified organism: Secondary | ICD-10-CM

## 2024-03-03 DIAGNOSIS — R918 Other nonspecific abnormal finding of lung field: Secondary | ICD-10-CM | POA: Diagnosis not present

## 2024-03-03 DIAGNOSIS — I2510 Atherosclerotic heart disease of native coronary artery without angina pectoris: Secondary | ICD-10-CM | POA: Diagnosis not present

## 2024-03-03 MED ORDER — BENZONATATE 200 MG PO CAPS
200.0000 mg | ORAL_CAPSULE | Freq: Two times a day (BID) | ORAL | 0 refills | Status: DC | PRN
  Filled 2024-03-03: qty 60, 30d supply, fill #0

## 2024-03-04 ENCOUNTER — Encounter: Payer: Self-pay | Admitting: Physician Assistant

## 2024-03-04 ENCOUNTER — Other Ambulatory Visit: Payer: Self-pay

## 2024-03-04 ENCOUNTER — Other Ambulatory Visit: Payer: Self-pay | Admitting: Physician Assistant

## 2024-03-04 DIAGNOSIS — J189 Pneumonia, unspecified organism: Secondary | ICD-10-CM

## 2024-03-04 MED ORDER — PREDNISONE 20 MG PO TABS
ORAL_TABLET | ORAL | 0 refills | Status: AC
  Filled 2024-03-04: qty 18, 9d supply, fill #0

## 2024-03-04 MED ORDER — AMOXICILLIN-POT CLAVULANATE 875-125 MG PO TABS
1.0000 | ORAL_TABLET | Freq: Two times a day (BID) | ORAL | 0 refills | Status: DC
Start: 2024-03-04 — End: 2024-04-08
  Filled 2024-03-04: qty 14, 7d supply, fill #0

## 2024-03-12 ENCOUNTER — Other Ambulatory Visit: Payer: Self-pay

## 2024-03-12 ENCOUNTER — Other Ambulatory Visit: Payer: Self-pay | Admitting: Family Medicine

## 2024-03-12 DIAGNOSIS — F332 Major depressive disorder, recurrent severe without psychotic features: Secondary | ICD-10-CM

## 2024-03-12 MED FILL — Omeprazole Cap Delayed Release 40 MG: ORAL | 90 days supply | Qty: 90 | Fill #0 | Status: AC

## 2024-03-12 MED FILL — Bupropion HCl Tab ER 24HR 150 MG: ORAL | 90 days supply | Qty: 270 | Fill #0 | Status: AC

## 2024-03-20 ENCOUNTER — Other Ambulatory Visit: Payer: Self-pay

## 2024-03-20 ENCOUNTER — Encounter: Payer: Self-pay | Admitting: Physician Assistant

## 2024-03-20 ENCOUNTER — Other Ambulatory Visit: Payer: Self-pay | Admitting: Cardiology

## 2024-03-20 MED ORDER — POTASSIUM CHLORIDE ER 10 MEQ PO TBCR
10.0000 meq | EXTENDED_RELEASE_TABLET | Freq: Every day | ORAL | 1 refills | Status: DC
  Filled 2024-03-20: qty 90, 90d supply, fill #0
  Filled 2024-06-22: qty 90, 90d supply, fill #1

## 2024-03-23 ENCOUNTER — Other Ambulatory Visit: Payer: Self-pay

## 2024-04-07 NOTE — Progress Notes (Unsigned)
 Subjective:  Patient ID: Nichole Gonzalez, female    DOB: 1976/12/06  Age: 48 y.o. MRN: [id number]  No chief complaint on file.   Discussed the use of AI scribe software for clinical note transcription with the patient, who gave verbal consent to proceed.         01/07/2024    8:53 AM 10/04/2023    8:35 AM 06/13/2023    8:37 AM 02/26/2023    8:32 AM 01/03/2023    7:41 AM  Depression screen PHQ 2/9  Decreased Interest 0 1 1 2  0  Down, Depressed, Hopeless 0 0 3 2 0  PHQ - 2 Score 0 1 4 4  0  Altered sleeping 0 0 1 3 1   Tired, decreased energy 2 1 1 3 2   Change in appetite 3 1 3  0 1  Feeling bad or failure about yourself  0 0 3 0 0  Trouble concentrating 0 0 3 3 1   Moving slowly or fidgety/restless 0 0 2 0 0  Suicidal thoughts 0 0 0 0 0  PHQ-9 Score 5 3 17 13 5   Difficult doing work/chores Somewhat difficult Not difficult at all Very difficult Somewhat difficult Somewhat difficult        01/07/2024    8:53 AM  Fall Risk   Falls in the past year? 0  Number falls in past yr: 0  Injury with Fall? 0  Risk for fall due to : No Fall Risks  Follow up Falls evaluation completed    Patient Care Team: Langley Gauss, [place] as PCP - General (Physician Assistant)   Review of Systems  Constitutional:  Negative for appetite change, fatigue and fever.  HENT:  Negative for congestion, ear pain, sinus pressure and sore throat.   Respiratory:  Negative for cough, chest tightness, shortness of breath and wheezing.   Cardiovascular:  Negative for chest pain and palpitations.  Gastrointestinal:  Negative for abdominal pain, constipation, diarrhea, nausea and vomiting.  Genitourinary:  Negative for dysuria and hematuria.  Musculoskeletal:  Negative for arthralgias, back pain, joint swelling and myalgias.  Skin:  Negative for rash.  Neurological:  Negative for dizziness, weakness and headaches.  Psychiatric/Behavioral:  Negative for dysphoric mood. The patient is not nervous/anxious.      Current Outpatient Medications on File Prior to Visit  Medication Sig Dispense Refill   benzonatate (TESSALON) 200 MG capsule Take 1 capsule (200 mg total) by mouth 2 (two) times daily as needed for cough. 60 capsule 0   amoxicillin-clavulanate (AUGMENTIN) 875-125 MG tablet Take 1 tablet by mouth 2 (two) times daily. 14 tablet 0   Ascorbic Acid (VITAMIN C PO) Take by mouth.     atorvastatin (LIPITOR) 20 MG tablet Take 1 tablet (20 mg total) by mouth daily. 90 tablet 3   buPROPion (WELLBUTRIN XL) 150 MG 24 hr tablet Take 3 tablets (450 mg total) by mouth daily. 270 tablet 3   celecoxib (CELEBREX) 100 MG capsule Take 1 capsule (100 mg total) by mouth 2 (two) times daily. 20 capsule 0   cetirizine (ZYRTEC ALLERGY) 10 MG tablet Take 1 tablet (10 mg total) by mouth daily. 90 tablet 3   cevimeline (EVOXAC) 30 MG capsule Take 1 capsule (30 mg total) by mouth 3 (three) times daily. 90 capsule 3   citalopram (CELEXA) 40 MG tablet Take 1 tablet (40 mg total) by mouth daily. 90 tablet 1   cyclobenzaprine (FLEXERIL) 10 MG tablet Take 1 tablet (10 mg total) by mouth at  bedtime. 90 tablet 0   doxycycline (VIBRA-TABS) 100 MG tablet Take 1 tablet (100 mg total) by mouth 2 (two) times daily. 10 tablet 0   EPIPEN 2-PAK 0.3 MG/0.3ML SOAJ injection   0   fluticasone (FLONASE) 50 MCG/ACT nasal spray Place 1 spray into both nostrils once daily. 48 g 3   hydrochlorothiazide (HYDRODIURIL) 25 MG tablet Take 1 tablet (25 mg total) by mouth daily. 30 tablet 2   hydrOXYzine (VISTARIL) 25 MG capsule Take 1 capsule (25 mg total) by mouth every 8 (eight) hours as needed. 90 capsule 0   LORazepam (ATIVAN) 0.5 MG tablet Take 1 tablet (0.5 mg total) by mouth 2 (two) times daily as needed (panic attacks). 30 tablet 1   omeprazole (PRILOSEC) 40 MG capsule Take 1 capsule (40 mg total) by mouth daily. 90 capsule 3   potassium chloride (KLOR-CON) 10 MEQ tablet Take 1 tablet (10 mEq total) by mouth daily. 90 tablet 1    Semaglutide-Weight Management (WEGOVY) 0.25 MG/0.5ML SOAJ Inject 0.25 mg into the skin once a week. 2 mL 0   topiramate (TOPAMAX) 100 MG tablet Take 1 tablet (100 mg total) by mouth daily. 90 tablet 0   traZODone (DESYREL) 100 MG tablet Take 1 tablet (100 mg total) by mouth at bedtime. 90 tablet 0   No current facility-administered medications on file prior to visit.   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   Primary insomnia 09/08/2020   Tobacco abuse 09/03/2015   Past Surgical History:  Procedure Laterality Date   ABDOMINAL HYSTERECTOMY     APPENDECTOMY     CHOLECYSTECTOMY     DILATION AND CURETTAGE, DIAGNOSTIC / THERAPEUTIC     JOINT REPLACEMENT     KNEE ARTHROSCOPY     right foot     TUBAL LIGATION      Family History  Problem Relation Age of Onset   Thyroid disease Sister    Coronary artery disease Father    Heart attack Father    COPD Sister    Diabetes Mellitus II Sister    Social History   Socioeconomic History   Marital status: Married    Spouse name: Not on file   Number of children: 3   Years of education: Not on file   Highest education level: Bachelor's degree (e.g., BA, AB, BS)  Occupational History   Not on file  Tobacco Use   Smoking status: Former    Current packs/day: 1.00    Average packs/day: 1 pack/day for 30.0 years (30.0 ttl pk-yrs)    Types: Cigarettes    Passive exposure: Never   Smokeless tobacco: Never  Vaping Use   Vaping status: Never Used  Substance and Sexual Activity   Alcohol use: Yes    Alcohol/week: 8.0 standard drinks of alcohol    Types: 8 Standard drinks or equivalent per week    Comment: socially   Drug use: Never   Sexual activity: Not on  file  Other Topics Concern   Not on file  Social History Narrative   ** Merged History Encounter **       Social Drivers of Health   Financial Resource Strain: Low Risk  (01/28/2024)   Received from [facility]  Overall Financial Resource Strain (CARDIA)    Difficulty of Paying Living Expenses: Not very hard  Food Insecurity: No Food Insecurity (01/28/2024)   Received from [facility]   Hunger Vital Sign    Worried About Running Out of Food in the Last Year: Never true    Ran Out of Food in the Last Year: Never true  Transportation Needs: No Transportation Needs (01/28/2024)   Received from [facility] - Transportation    In the past 12 months, has lack of transportation kept you from medical appointments or from getting medications?: No    Lack of Transportation (Non-Medical): No  Physical Activity: Inactive (01/07/2024)   Exercise Vital Sign    Days of Exercise per Week: 0 days    Minutes of Exercise per Session: 30 min  Stress: Stress Concern Present (01/07/2024)   [employer] of Occupational Health - Occupational Stress Questionnaire    Feeling of Stress : To some extent  Social Connections: Moderately Isolated (01/07/2024)   Social Connection and Isolation Panel [NHANES]    Frequency of Communication with Friends and Family: More than three times a week    Frequency of Social Gatherings with Friends and Family: More than three times a week    Attends Religious Services: Never    Database administrator or Organizations: No    Attends Banker Meetings: Not on file    Marital Status: Married    Objective:  LMP 01/04/2016 (Approximate)      01/07/2024    8:51 AM 10/25/2023    8:51 AM 10/18/2023    9:43 AM  BP/Weight  Systolic BP 102 130 133  Diastolic BP 70 76 97  Wt. (Lbs) 218.8 222 221.7  BMI 35.32 kg/m2 35.83 kg/m2 35.78 kg/m2    Physical Exam  Diabetic Foot Exam - Simple   No data  filed      Lab Results  Component Value Date   WBC 10.0 01/03/2024   HGB 14.5 01/03/2024   HCT 44.3 01/03/2024   PLT 210 01/03/2024   GLUCOSE 85 01/03/2024   CHOL 170 01/03/2024   TRIG 149 01/03/2024   HDL 44 01/03/2024   LDLCALC 100 (H) 01/03/2024   ALT 26 01/03/2024   AST 22 01/03/2024   NA 140 01/03/2024   K 4.2 01/03/2024   CL 103 01/03/2024   CREATININE 0.94 01/03/2024   BUN 7 01/03/2024   CO2 20 01/03/2024   TSH 0.688 01/03/2024   HGBA1C 5.5 01/03/2024      Assessment & Plan:  Assessment and Plan       There are no diagnoses linked to this encounter.   No orders of the defined types were placed in this encounter.   No orders of the defined types were placed in this encounter.    Follow-up: No follow-ups on file.   I,Lauren M Auman,acting as a Neurosurgeon for [employer], PA.,have documented all relevant documentation on the behalf of Odilia Bennett, PA,as directed by  Odilia Bennett, PA while in the presence of Odilia Bennett, [place].   An After Visit Summary was printed and given to the patient.  Odilia Bennett, [place] [facility] [phone]

## 2024-04-08 ENCOUNTER — Encounter: Payer: Self-pay | Admitting: Physician Assistant

## 2024-04-08 ENCOUNTER — Other Ambulatory Visit: Payer: Self-pay

## 2024-04-08 ENCOUNTER — Ambulatory Visit (HOSPITAL_BASED_OUTPATIENT_CLINIC_OR_DEPARTMENT_OTHER)
Admission: RE | Admit: 2024-04-08 | Discharge: 2024-04-08 | Disposition: A | Discharge status: Home / Self Care | Source: Ambulatory Visit | Attending: Physician Assistant | Admitting: Radiology

## 2024-04-08 ENCOUNTER — Ambulatory Visit: Payer: Commercial Managed Care - PPO | Admitting: Physician Assistant

## 2024-04-08 VITALS — BP 110/72 | HR 82 | Temp 97.8°F | Resp 16 | Ht 66.0 in | Wt 221.0 lb

## 2024-04-08 DIAGNOSIS — R053 Chronic cough: Secondary | ICD-10-CM

## 2024-04-08 DIAGNOSIS — Z6835 Body mass index (BMI) 35.0-35.9, adult: Secondary | ICD-10-CM

## 2024-04-08 DIAGNOSIS — R059 Cough, unspecified: Secondary | ICD-10-CM

## 2024-04-08 DIAGNOSIS — J301 Allergic rhinitis due to pollen: Secondary | ICD-10-CM

## 2024-04-08 DIAGNOSIS — F330 Major depressive disorder, recurrent, mild: Secondary | ICD-10-CM

## 2024-04-08 DIAGNOSIS — M25561 Pain in right knee: Secondary | ICD-10-CM

## 2024-04-08 DIAGNOSIS — K117 Disturbances of salivary secretion: Secondary | ICD-10-CM | POA: Diagnosis not present

## 2024-04-08 DIAGNOSIS — G4733 Obstructive sleep apnea (adult) (pediatric): Secondary | ICD-10-CM | POA: Diagnosis not present

## 2024-04-08 DIAGNOSIS — M25562 Pain in left knee: Secondary | ICD-10-CM | POA: Diagnosis not present

## 2024-04-08 DIAGNOSIS — E782 Mixed hyperlipidemia: Secondary | ICD-10-CM | POA: Diagnosis not present

## 2024-04-08 MED ORDER — ALBUTEROL SULFATE HFA 108 (90 BASE) MCG/ACT IN AERS
2.0000 | INHALATION_SPRAY | Freq: Four times a day (QID) | RESPIRATORY_TRACT | 2 refills | Status: AC | PRN
Start: 2024-04-08 — End: ?
  Filled 2024-04-08: qty 6.7, 25d supply, fill #0

## 2024-04-08 MED ORDER — LISDEXAMFETAMINE DIMESYLATE 20 MG PO CAPS
20.0000 mg | ORAL_CAPSULE | Freq: Every day | ORAL | 0 refills | Status: DC
  Filled 2024-04-08: qty 30, 30d supply, fill #0

## 2024-04-08 NOTE — Patient Instructions (Signed)
 VISIT SUMMARY:  During your visit, we discussed your persistent cough, swelling in your feet and ankles, dry mouth, fatigue, and concerns about weight management. We also reviewed your history of rheumatoid arthritis, sleep apnea, and iron overload.  YOUR PLAN:  -CHRONIC COUGH: Your persistent cough may be related to allergies, a residual infection, or inflammation. We will perform a chest x-ray to check for any infection or inflammation. In the meantime, you will use an albuterol inhaler to help with potential allergy symptoms. If the x-ray shows any issues, we will refer you to a lung specialist.  -OBSTRUCTIVE SLEEP APNEA (OSA): Your sleep apnea is causing fatigue and daytime sleepiness, and you have not been able to use your CPAP machine. We will refer you to a lung specialist for further evaluation and a possible sleep study. We will also help you find an affordable CPAP machine.  -RHEUMATOID ARTHRITIS: You are experiencing ongoing knee pain and have had difficulty communicating with your orthopedic provider about gel injections. We will contact your orthopedic provider to help facilitate communication regarding these injections.  -IRON OVERLOAD: You have a genetic condition that causes your body to store too much iron, which may contribute to your fatigue. We will order iron studies, including ferritin levels, and refer you to a blood specialist if the results are abnormal.  -WEIGHT MANAGEMENT: You have had difficulty managing your weight. We will prescribe Vyvanse to help suppress your appetite. It is important to take it in the morning and stay hydrated to avoid side effects like insomnia, increased blood pressure, and headaches.  -GENERAL HEALTH MAINTENANCE: We will perform routine blood work to monitor your overall health, including tests for B12 and vitamin D levels to see if they are contributing to your fatigue. Your previous A1c and thyroid levels were normal.  INSTRUCTIONS:  Please  follow up with the pulmonologist and hematologist as needed. We will send your prescriptions to your community pharmacy and coordinate your chest x-ray at your preferred location. Monitor your response to Vyvanse and let us  know if you experience any side effects or if adjustments are needed.

## 2024-04-08 NOTE — Assessment & Plan Note (Signed)
 OSA with non-compliance to CPAP due to machine issues. Reports fatigue and daytime sleepiness. - Refer to pulmonologist for evaluation and potential sleep study. - Assist in obtaining a suitable CPAP machine.

## 2024-04-08 NOTE — Assessment & Plan Note (Signed)
 Ongoing knee pain. Difficulty communicating with orthopedic provider regarding gel injections. Steroid injections avoided due to blood pressure concerns. - Contact orthopedic provider to facilitate communication regarding gel injections.

## 2024-04-08 NOTE — Assessment & Plan Note (Signed)
 Persistent cough likely allergy-related. Differential includes residual infection or inflammation. - Order chest x-ray to assess for residual infection or inflammation. - Prescribe albuterol inhaler for potential allergy-related symptoms. - Refer to pulmonologist if x-ray indicates infection or inflammation.

## 2024-04-08 NOTE — Assessment & Plan Note (Signed)
 Elevated ferritin and genetic predisposition. Potential contribution to chronic fatigue. Regular monitoring and potential therapeutic phlebotomy discussed. - Order iron studies including ferritin. - Refer to hematology if iron studies are abnormal.

## 2024-04-08 NOTE — Assessment & Plan Note (Signed)
 LDL slightly elevated despite Lipitor use. Family history of heart disease. -Increase Lipitor dose, monitor cholesterol levels at next visit. Lab Results  Component Value Date   LDLCALC 100 (H) 01/03/2024

## 2024-04-08 NOTE — Assessment & Plan Note (Signed)
 Controlled Denies any new or worsening symptoms Continue taking Wellbutrin 150mg , Lorazapam .5mg , Celexa 40mg  Will adjust depending on symptoms

## 2024-04-08 NOTE — Assessment & Plan Note (Signed)
 Chronic issue, not resolved with hydration, mouth rinses, or medication adjustments. Patient feels constantly dehydrated despite adequate fluid intake. -Denies any side effects with pilocarpine Continue to monitor Will adjust treatment depending on symptoms

## 2024-04-08 NOTE — Assessment & Plan Note (Signed)
 Difficulty with weight management. Vyvanse prescribed for appetite suppression. Discussed potential side effects and emphasized hydration and morning dosing. - Prescribe Vyvanse for appetite suppression, starting at the lowest dose. - Discuss potential side effects of Vyvanse including insomnia, increased blood pressure, and headaches. - Advise on adequate hydration and morning dosing to mitigate side effects. -Comorbidity includes hyperlipidemia, OSA, RA

## 2024-04-08 NOTE — Assessment & Plan Note (Signed)
 Continue taking Claritin as prescribed Prescribed albuterol to also help with possible seasonal allergies causing cough Continue to monitor symptoms Will adjust treatment as needed

## 2024-04-09 LAB — COMPREHENSIVE METABOLIC PANEL WITH GFR
ALT: 27 IU/L (ref 0–32)
AST: 20 IU/L (ref 0–40)
Albumin: 4.3 g/dL (ref 3.9–4.9)
Alkaline Phosphatase: 127 IU/L — ABNORMAL HIGH (ref 44–121)
BUN/Creatinine Ratio: 8 — ABNORMAL LOW (ref 9–23)
BUN: 7 mg/dL (ref 6–24)
Bilirubin Total: 0.3 mg/dL (ref 0.0–1.2)
CO2: 23 mmol/L (ref 20–29)
Calcium: 9.3 mg/dL (ref 8.7–10.2)
Chloride: 105 mmol/L (ref 96–106)
Creatinine, Ser: 0.89 mg/dL (ref 0.57–1.00)
Globulin, Total: 2.5 g/dL (ref 1.5–4.5)
Glucose: 91 mg/dL (ref 70–99)
Potassium: 4.2 mmol/L (ref 3.5–5.2)
Sodium: 141 mmol/L (ref 134–144)
Total Protein: 6.8 g/dL (ref 6.0–8.5)
eGFR: 79 mL/min/{1.73_m2} (ref >59)

## 2024-04-09 LAB — CBC WITH DIFFERENTIAL/PLATELET
Basophils Absolute: 0.1 10*3/uL (ref 0.0–0.2)
Basos: 1 %
EOS (ABSOLUTE): 0.2 10*3/uL (ref 0.0–0.4)
Eos: 1 %
Hematocrit: 44.7 % (ref 34.0–46.6)
Hemoglobin: 14.8 g/dL (ref 11.1–15.9)
Immature Grans (Abs): 0 10*3/uL (ref 0.0–0.1)
Immature Granulocytes: 0 %
Lymphocytes Absolute: 4.4 10*3/uL — ABNORMAL HIGH (ref 0.7–3.1)
Lymphs: 40 %
MCH: 32.2 pg (ref 26.6–33.0)
MCHC: 33.1 g/dL (ref 31.5–35.7)
MCV: 97 fL (ref 79–97)
Monocytes Absolute: 0.7 10*3/uL (ref 0.1–0.9)
Monocytes: 6 %
Neutrophils Absolute: 5.5 10*3/uL (ref 1.4–7.0)
Neutrophils: 52 %
Platelets: 271 10*3/uL (ref 150–450)
RBC: 4.6 x10E6/uL (ref 3.77–5.28)
RDW: 12.1 % (ref 11.7–15.4)
WBC: 10.8 10*3/uL (ref 3.4–10.8)

## 2024-04-09 LAB — LIPID PANEL
Chol/HDL Ratio: 4.2 ratio (ref 0.0–4.4)
Cholesterol, Total: 177 mg/dL (ref 100–199)
HDL: 42 mg/dL (ref >39)
LDL Chol Calc (NIH): 107 mg/dL — ABNORMAL HIGH (ref 0–99)
Triglycerides: 157 mg/dL — ABNORMAL HIGH (ref 0–149)
VLDL Cholesterol Cal: 28 mg/dL (ref 5–40)

## 2024-04-09 LAB — VITAMIN B12: Vitamin B-12: 630 pg/mL (ref 232–1245)

## 2024-04-09 LAB — IRON,TIBC AND FERRITIN PANEL
Ferritin: 296 ng/mL — ABNORMAL HIGH (ref 15–150)
Iron Saturation: 42 % (ref 15–55)
Iron: 109 ug/dL (ref 27–159)
Total Iron Binding Capacity: 259 ug/dL (ref 250–450)
UIBC: 150 ug/dL (ref 131–425)

## 2024-04-09 LAB — VITAMIN D 25 HYDROXY (VIT D DEFICIENCY, FRACTURES): Vit D, 25-Hydroxy: 52.6 ng/mL (ref 30.0–100.0)

## 2024-04-13 ENCOUNTER — Encounter: Payer: Self-pay | Admitting: Physician Assistant

## 2024-04-15 ENCOUNTER — Inpatient Hospital Stay: Payer: Commercial Managed Care - PPO | Admitting: Hematology and Oncology

## 2024-04-15 ENCOUNTER — Ambulatory Visit: Payer: Commercial Managed Care - PPO | Admitting: Oncology

## 2024-04-15 ENCOUNTER — Inpatient Hospital Stay: Payer: Commercial Managed Care - PPO

## 2024-04-16 ENCOUNTER — Encounter: Payer: Self-pay | Admitting: Physician Assistant

## 2024-04-21 ENCOUNTER — Encounter: Payer: Self-pay | Admitting: Physician Assistant

## 2024-04-22 ENCOUNTER — Telehealth: Payer: Self-pay

## 2024-04-22 NOTE — Telephone Encounter (Signed)
 Order, notes and sleep study faxed to [facility].

## 2024-04-28 DIAGNOSIS — G4733 Obstructive sleep apnea (adult) (pediatric): Secondary | ICD-10-CM | POA: Diagnosis not present

## 2024-05-01 NOTE — Progress Notes (Unsigned)
 [facility]  [address] [place],  [place]  [address] [phone]  Clinic Day:  05/01/2024  Referring physician: Odilia Bennett, PA   HISTORY OF PRESENT ILLNESS:  The patient is a 48 y.o. 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  She is a former smoker, having smoked 1 pack/day for 30 years.  She quit smoking ***.  The leukocytosis improved significantly upon smoking cessation.  FISH for BCR/ABL was negative and JAK2 panel was negative, so no evidence of myeloproliferative neoplasm.  She also had elevated ferritin.  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  She had a positive rheumatoid factor but additional testing did not elicit a specific etiology.  She is here for repeat clinical assessment.  Since her last visit she underwent CT chest without contrast which revealed a right middle lobe pulmonary nodules measuring up to 7 mm.  Repeat non-contrast Chest CT at 3-6 months was recommended, then consider another non-contrast Chest CT at 18-24 months.  The CT chest also revealed possible hepatic nodular contours which can be seen with cirrhosis.  The patient does consume 8 alcoholic drinks per week ***.  PHYSICAL EXAM:   Last menstrual period 01/04/2016. Wt Readings from Last 3 Encounters:  04/08/24 221 lb (100.2 kg)  01/07/24 218 lb 12.8 oz (99.2 kg)  10/25/23 222 lb (100.7 kg)   There is no height or weight on file to calculate BMI.  Performance status (ECOG): {CHL ONC [id number]  Physical Exam  LABS:      Latest Ref Rng & Units 04/08/2024    8:15 AM 01/03/2024    7:39 AM 10/04/2023    9:17 AM  CBC  WBC 3.4 - 10.8 x10E3/uL 10.8  10.0  13.7   Hemoglobin 11.1 - 15.9 g/dL 91.4  78.2  95.6   Hematocrit 34.0 - 46.6 % 44.7  44.3  43.5   Platelets 150 - 450 x10E3/uL 271  210  309       Latest Ref Rng & Units 04/08/2024    8:15 AM 01/03/2024    7:39  AM 10/04/2023    9:17 AM  CMP  Glucose 70 - 99 mg/dL 91  85  213   BUN 6 - 24 mg/dL 7  7  13    Creatinine 0.57 - 1.00 mg/dL 0.86  5.78  4.69   Sodium 134 - 144 mmol/L 141  140  144   Potassium 3.5 - 5.2 mmol/L 4.2  4.2  3.8   Chloride 96 - 106 mmol/L 105  103  104   CO2 20 - 29 mmol/L 23  20  26    Calcium  8.7 - 10.2 mg/dL 9.3  9.5  9.4   Total Protein 6.0 - 8.5 g/dL 6.8  6.8  6.2   Total Bilirubin 0.0 - 1.2 mg/dL 0.3  0.3  0.3   Alkaline Phos 44 - 121 IU/L 127  92  94   AST 0 - 40 IU/L 20  22  13    ALT 0 - 32 IU/L 27  26  20       No results found for: "CEA1", "CEA" / No results found for: "CEA1", "CEA" No results found for: "PSA1" No results found for: "GEX528" No results found for: "CAN125"  Lab Results  Component Value Date   TOTALPROTELP 6.7 03/11/2023   Lab Results  Component Value Date   TIBC 259  04/08/2024   TIBC 241 (L) 02/26/2023   FERRITIN 296 (H) 04/08/2024   FERRITIN 145 10/18/2023   FERRITIN 376 (H) 02/26/2023   IRONPCTSAT 42 04/08/2024   IRONPCTSAT 46 02/26/2023   No results found for: "LDH"     Component Value Date/Time   TOTALPROTELP 6.7 03/11/2023 0938   IGGSERUM 966 03/11/2023 0938   IGMSERUM 120 03/11/2023 0938    Review Flowsheet  More data exists      Latest Ref Rng & Units 03/11/2023 10/18/2023 04/08/2024  Oncology Labs  Ferritin 15 - 150 ng/mL - 145  296   %SAT 15 - 55 % - - 42   Total Protein ELP 6.0 - 8.5 g/dL 6.7  C - -  IgG (Immunoglobin G), Serum 586 - 1,602 mg/dL 401  - -  IgM, Serum 26 - 217 mg/dL 027  - -    Details      C Corrected result          STUDIES:  DG Chest 2 View Result Date: 04/15/2024 CLINICAL DATA:  Cough for 1 month. EXAM: CHEST - 2 VIEW COMPARISON:  March 02, 2024. FINDINGS: The heart size and mediastinal contours are within normal limits. No definite acute pulmonary abnormality seen. The visualized skeletal structures are unremarkable. IMPRESSION: No active cardiopulmonary disease. Electronically Signed    By: Rosalene Colon M.D.   On: 04/15/2024 16:40      ASSESSMENT & PLAN:   Assessment/Plan:  48 y.o. female with ***  The patient understands all the plans discussed today and is in agreement with them.  She knows to contact our office if she develops concerns prior to her next appointment.     Alfonso Ike, PA-C   Physician Assistant [place] [facility] [phone]

## 2024-05-05 ENCOUNTER — Inpatient Hospital Stay: Admitting: Hematology and Oncology

## 2024-05-05 ENCOUNTER — Inpatient Hospital Stay: Attending: Hematology and Oncology

## 2024-05-05 ENCOUNTER — Encounter: Payer: Self-pay | Admitting: Hematology and Oncology

## 2024-05-05 VITALS — BP 115/73 | HR 79 | Temp 98.5°F | Resp 20 | Ht 66.0 in | Wt 221.5 lb

## 2024-05-05 DIAGNOSIS — D72829 Elevated white blood cell count, unspecified: Secondary | ICD-10-CM | POA: Diagnosis not present

## 2024-05-05 DIAGNOSIS — R7989 Other specified abnormal findings of blood chemistry: Secondary | ICD-10-CM

## 2024-05-05 DIAGNOSIS — Z87891 Personal history of nicotine dependence: Secondary | ICD-10-CM | POA: Diagnosis not present

## 2024-05-05 DIAGNOSIS — D72828 Other elevated white blood cell count: Secondary | ICD-10-CM

## 2024-05-05 DIAGNOSIS — Z148 Genetic carrier of other disease: Secondary | ICD-10-CM

## 2024-05-05 LAB — CBC WITH DIFFERENTIAL (CANCER CENTER ONLY)
Abs Immature Granulocytes: 0.04 10*3/uL (ref 0.00–0.07)
Basophils Absolute: 0.1 10*3/uL (ref 0.0–0.1)
Basophils Relative: 1 %
Eosinophils Absolute: 0.2 10*3/uL (ref 0.0–0.5)
Eosinophils Relative: 2 %
HCT: 41.8 % (ref 36.0–46.0)
Hemoglobin: 14.1 g/dL (ref 12.0–15.0)
Immature Granulocytes: 0 %
Lymphocytes Relative: 49 %
Lymphs Abs: 4.9 10*3/uL — ABNORMAL HIGH (ref 0.7–4.0)
MCH: 32.8 pg (ref 26.0–34.0)
MCHC: 33.7 g/dL (ref 30.0–36.0)
MCV: 97.2 fL (ref 80.0–100.0)
Monocytes Absolute: 0.6 10*3/uL (ref 0.1–1.0)
Monocytes Relative: 6 %
Neutro Abs: 4.1 10*3/uL (ref 1.7–7.7)
Neutrophils Relative %: 42 %
Platelet Count: 249 10*3/uL (ref 150–400)
RBC: 4.3 MIL/uL (ref 3.87–5.11)
RDW: 12.7 % (ref 11.5–15.5)
WBC Count: 9.9 10*3/uL (ref 4.0–10.5)
nRBC: 0 % (ref 0.0–0.2)

## 2024-05-05 LAB — CMP (CANCER CENTER ONLY)
ALT: 20 U/L (ref 0–44)
AST: 18 U/L (ref 15–41)
Albumin: 3.7 g/dL (ref 3.5–5.0)
Alkaline Phosphatase: 100 U/L (ref 38–126)
Anion gap: 12 (ref 5–15)
BUN: 8 mg/dL (ref 6–20)
CO2: 24 mmol/L (ref 22–32)
Calcium: 9.4 mg/dL (ref 8.9–10.3)
Chloride: 107 mmol/L (ref 98–111)
Creatinine: 0.88 mg/dL (ref 0.44–1.00)
GFR, Estimated: >60 mL/min (ref >60)
Glucose, Bld: 102 mg/dL — ABNORMAL HIGH (ref 70–99)
Potassium: 3.9 mmol/L (ref 3.5–5.1)
Sodium: 143 mmol/L (ref 135–145)
Total Bilirubin: <0.2 mg/dL (ref 0.0–1.2)
Total Protein: 6.3 g/dL — ABNORMAL LOW (ref 6.5–8.1)

## 2024-05-05 LAB — FERRITIN: Ferritin: 133 ng/mL (ref 11–307)

## 2024-05-05 LAB — IRON AND TIBC
Iron: 67 ug/dL (ref 28–170)
Saturation Ratios: 24 % (ref 10.4–31.8)
TIBC: 283 ug/dL (ref 250–450)
UIBC: 216 ug/dL

## 2024-05-06 ENCOUNTER — Telehealth: Payer: Self-pay

## 2024-05-06 NOTE — Telephone Encounter (Signed)
 Patient notified and voiced understanding.

## 2024-05-06 NOTE — Telephone Encounter (Signed)
-----   Message from Nichole Gonzalez sent at 05/06/2024  8:02 AM EDT ----- Please let her know her iron tests are back to normal. Keep appt in 6 months. Thanks

## 2024-05-07 ENCOUNTER — Ambulatory Visit: Admitting: Physician Assistant

## 2024-05-07 ENCOUNTER — Encounter: Payer: Self-pay | Admitting: Physician Assistant

## 2024-05-07 ENCOUNTER — Other Ambulatory Visit: Payer: Self-pay

## 2024-05-07 VITALS — BP 108/70 | HR 87 | Temp 98.1°F | Ht 66.0 in | Wt 221.0 lb

## 2024-05-07 DIAGNOSIS — K7689 Other specified diseases of liver: Secondary | ICD-10-CM | POA: Diagnosis not present

## 2024-05-07 DIAGNOSIS — Z6835 Body mass index (BMI) 35.0-35.9, adult: Secondary | ICD-10-CM

## 2024-05-07 DIAGNOSIS — F17209 Nicotine dependence, unspecified, with unspecified nicotine-induced disorders: Secondary | ICD-10-CM | POA: Insufficient documentation

## 2024-05-07 DIAGNOSIS — R918 Other nonspecific abnormal finding of lung field: Secondary | ICD-10-CM | POA: Insufficient documentation

## 2024-05-07 DIAGNOSIS — F330 Major depressive disorder, recurrent, mild: Secondary | ICD-10-CM

## 2024-05-07 MED ORDER — LISDEXAMFETAMINE DIMESYLATE 40 MG PO CAPS
40.0000 mg | ORAL_CAPSULE | ORAL | 0 refills | Status: DC
  Filled 2024-05-07: qty 60, 60d supply, fill #0

## 2024-05-07 MED ORDER — VARENICLINE TARTRATE 0.5 MG PO TABS
0.5000 mg | ORAL_TABLET | Freq: Two times a day (BID) | ORAL | 1 refills | Status: DC
Start: 2024-05-07 — End: 2024-07-09
  Filled 2024-05-07: qty 60, 30d supply, fill #0
  Filled 2024-06-01: qty 60, 30d supply, fill #1

## 2024-05-07 NOTE — Assessment & Plan Note (Signed)
 No side effects of medicine Increased Vyvanse  to 40mg  Continue to monitor for any side effects Will adjust treatment as needed

## 2024-05-07 NOTE — Assessment & Plan Note (Signed)
 On 150 mg Wellbutrin  without side effects. Plan to increase dose to 300 mg to improve symptoms. - Monitor for side effects and adjust dosage as needed.

## 2024-05-07 NOTE — Assessment & Plan Note (Signed)
 Lung nodules on CT. Hematologist suggested potential early lung cancer. Awaiting pulmonologist's recommendations. - Order repeat chest CT in 3-6 months. - Refer to pulmonologist for further evaluation.

## 2024-05-07 NOTE — Assessment & Plan Note (Signed)
 Resumed smoking due to stress. Previously used Chantix  successfully. Discussed restarting Chantix  and potential Wellbutrin  for cravings. - Prescribe Chantix , starting with 0.5 mg for one week, then increase to 1 mg. - Discuss potential side effects of Chantix , including nausea and vivid dreams.

## 2024-05-07 NOTE — Assessment & Plan Note (Signed)
 Liver nodules on CT. Hematologist mentioned possible early cirrhosis. Iron levels normalized, biopsy not planned. Repeat CT in 3-6 months

## 2024-05-07 NOTE — Progress Notes (Signed)
 Subjective:  Patient ID: Nichole Gonzalez, female    DOB: 13-May-1976  Age: 48 y.o. MRN: [id number]  Chief Complaint  Patient presents with   Medical Management of Chronic Issues    HPI:   Weight management: Patient states she is unsure if medication is helping. Vyvanse  20 mg.  Discussed the use of AI scribe software for clinical note transcription with the patient, who gave verbal consent to proceed.  History of Present Illness   Nichole Gonzalez is a 48 year old female who presents for medication management and follow-up on recent hematology and CT scan results.  She has been taking Bentyl  for years and finds it helpful. She recently saw a hematologist who reviewed her CT scan, noting nodules on her liver and lungs. A liver biopsy was considered due to previously high iron levels, but her iron levels have since normalized.  She is on a lower dose of her medication and notices no difference in symptoms. She denies experiencing any racing heart symptoms. She has been on 150 mg of Wellbutrin , which previously helped with smoking cravings.  Her brother recently had a heart attack, which has been a source of stress. She is considering returning to Chantix , which previously helped her quit smoking for several months. She attributes her return to smoking to a rough patch with her husband but notes she is smoking less than before.  She recently obtained a CPAP machine and believes it is working well, noting an increase in energy during the day. However, she describes an incident where she woke up with the hose detached from her mask, possibly due to active dreaming. Despite this, she feels the CPAP is effective when left undisturbed.         04/08/2024    7:41 AM 01/07/2024    8:53 AM 10/04/2023    8:35 AM 06/13/2023    8:37 AM 02/26/2023    8:32 AM  Depression screen PHQ 2/9  Decreased Interest 0 0 1 1 2   Down, Depressed, Hopeless 0 0 0 3 2  PHQ - 2 Score 0 0 1 4 4   Altered  sleeping 0 0 0 1 3  Tired, decreased energy 3 2 1 1 3   Change in appetite 0 3 1 3  0  Feeling bad or failure about yourself  0 0 0 3 0  Trouble concentrating 0 0 0 3 3  Moving slowly or fidgety/restless 0 0 0 2 0  Suicidal thoughts 0 0 0 0 0  PHQ-9 Score 3 5 3 17 13   Difficult doing work/chores Somewhat difficult Somewhat difficult Not difficult at all Very difficult Somewhat difficult        04/08/2024    7:41 AM  Fall Risk   Falls in the past year? 1  Number falls in past yr: 0  Injury with Fall? 1  Risk for fall due to : No Fall Risks  Follow up Falls evaluation completed    Patient Care Team: Odilia Bennett, [place] as PCP - General (Physician Assistant)   Review of Systems  Constitutional:  Negative for chills, fatigue and fever.  HENT:  Negative for congestion, ear pain, rhinorrhea and sore throat.   Respiratory:  Negative for cough and shortness of breath.   Cardiovascular:  Negative for chest pain.  Gastrointestinal:  Negative for abdominal pain, constipation, diarrhea, nausea and vomiting.  Genitourinary:  Negative for dysuria and urgency.  Musculoskeletal:  Negative for back pain and myalgias.  Neurological:  Negative for dizziness,  weakness, light-headedness and headaches.  Psychiatric/Behavioral:  Negative for dysphoric mood. The patient is not nervous/anxious.     Current Outpatient Medications on File Prior to Visit  Medication Sig Dispense Refill   celecoxib  (CELEBREX ) 100 MG capsule Take 1 capsule (100 mg total) by mouth 2 (two) times daily. 20 capsule 0   albuterol  (VENTOLIN  HFA) 108 (90 Base) MCG/ACT inhaler Inhale 2 puffs into the lungs every 6 (six) hours as needed for wheezing or shortness of breath. 6.7 g 2   Ascorbic Acid (VITAMIN C PO) Take by mouth.     atorvastatin  (LIPITOR) 20 MG tablet Take 1 tablet (20 mg total) by mouth daily. 90 tablet 3   buPROPion  (WELLBUTRIN  XL) 150 MG 24 hr tablet Take 3 tablets (450 mg total) by mouth daily. 270 tablet 3    cetirizine  (ZYRTEC  ALLERGY) 10 MG tablet Take 1 tablet (10 mg total) by mouth daily. 90 tablet 3   cevimeline  (EVOXAC ) 30 MG capsule Take 1 capsule (30 mg total) by mouth 3 (three) times daily. 90 capsule 3   citalopram  (CELEXA ) 40 MG tablet Take 1 tablet (40 mg total) by mouth daily. 90 tablet 1   cyclobenzaprine  (FLEXERIL ) 10 MG tablet Take 1 tablet (10 mg total) by mouth at bedtime. 90 tablet 0   EPIPEN 2-PAK 0.3 MG/0.3ML SOAJ injection   0   fluticasone  (FLONASE ) 50 MCG/ACT nasal spray Place 1 spray into both nostrils once daily. 48 g 3   LORazepam  (ATIVAN ) 0.5 MG tablet Take 1 tablet (0.5 mg total) by mouth 2 (two) times daily as needed (panic attacks). 30 tablet 1   omeprazole  (PRILOSEC) 40 MG capsule Take 1 capsule (40 mg total) by mouth daily. 90 capsule 3   potassium chloride  (KLOR-CON ) 10 MEQ tablet Take 1 tablet (10 mEq total) by mouth daily. 90 tablet 1   sodium fluoride  (FLUORISHIELD) 1.1 % GEL dental gel Place 1 Application onto teeth at bedtime.     topiramate  (TOPAMAX ) 100 MG tablet Take 1 tablet (100 mg total) by mouth daily. 90 tablet 0   traZODone  (DESYREL ) 100 MG tablet Take 1 tablet (100 mg total) by mouth at bedtime. 90 tablet 0   No current facility-administered medications on file prior to visit.   Past Medical History:  Diagnosis Date   Abnormal thyroid  stimulating hormone (TSH) level 02/17/2015   Abnormal uterine bleeding 10/07/2020   Allergic rhinitis 09/03/2015   Depression, recurrent (HCC) 09/08/2020   Gastroesophageal reflux disease 10/27/2020   High risk sexual behavior 11/06/2020   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   Primary insomnia 09/08/2020   Tobacco abuse 09/03/2015   Past Surgical History:  Procedure Laterality Date   ABDOMINAL HYSTERECTOMY     APPENDECTOMY     CHOLECYSTECTOMY     DILATION AND CURETTAGE, DIAGNOSTIC / THERAPEUTIC     JOINT REPLACEMENT     KNEE ARTHROSCOPY     right foot     TUBAL LIGATION      Family History  Problem Relation Age of  Onset   Thyroid  disease Sister    Coronary artery disease Father    Heart attack Father    COPD Sister    Diabetes Mellitus II Sister    Social History   Socioeconomic History   Marital status: Married    Spouse name: Not on file   Number of children: 3   Years of education: Not on file   Highest education level: Bachelor's degree (e.g., BA, AB, BS)  Occupational History  Not on file  Tobacco Use   Smoking status: Every Day    Current packs/day: 0.50    Average packs/day: 1 pack/day for 30.5 years (30.3 ttl pk-yrs)    Types: Cigarettes    Start date: 10/25/2023    Passive exposure: Never   Smokeless tobacco: Never  Vaping Use   Vaping status: Never Used  Substance and Sexual Activity   Alcohol use: Yes    Alcohol/week: 8.0 standard drinks of alcohol    Types: 8 Standard drinks or equivalent per week    Comment: socially   Drug use: Never   Sexual activity: Not on file  Other Topics Concern   Not on file  Social History Narrative   ** Merged History Encounter **       Social Drivers of Health   Financial Resource Strain: Low Risk  (01/28/2024)   Received from [facility]   Overall Financial Resource Strain (CARDIA)    Difficulty of Paying Living Expenses: Not very hard  Food Insecurity: No Food Insecurity (01/28/2024)   Received from [facility]   Hunger Vital Sign    Worried About Running Out of Food in the Last Year: Never true    Ran Out of Food in the Last Year: Never true  Transportation Needs: No Transportation Needs (01/28/2024)   Received from [facility] - Transportation    In the past 12 months, has lack of transportation kept you from medical appointments or from getting medications?: No    Lack of Transportation (Non-Medical): No  Physical Activity: Inactive (01/07/2024)   Exercise Vital Sign    Days of Exercise per Week: 0 days    Minutes of Exercise per Session: 30 min  Stress: Stress  Concern Present (01/07/2024)   [employer] of Occupational [employer] - Occupational Stress Questionnaire    Feeling of Stress : To some extent  Social Connections: Moderately Isolated (01/07/2024)   Social Connection and Isolation Panel [NHANES]    Frequency of Communication with Friends and Family: More than three times a week    Frequency of Social Gatherings with Friends and Family: More than three times a week    Attends Religious Services: Never    Database administrator or Organizations: No    Attends Engineer, structural: Not on file    Marital Status: Married    Objective:  BP 108/70   Pulse 87   Temp 98.1 F (36.7 C)   Ht 5\' 6"  (1.676 m)   Wt 221 lb (100.2 kg)   LMP 01/04/2016 (Approximate)   SpO2 98%   BMI 35.67 kg/m      05/07/2024    7:37 AM 05/05/2024    3:19 PM 04/08/2024    7:35 AM  BP/Weight  Systolic BP 108 115 110  Diastolic BP 70 73 72  Wt. (Lbs) 221 221.5 221  BMI 35.67 kg/m2 35.75 kg/m2 35.67 kg/m2    Physical Exam Vitals reviewed.  Constitutional:      Appearance: Normal appearance.  Cardiovascular:     Rate and Rhythm: Normal rate and regular rhythm.     Heart sounds: Normal heart sounds.  Pulmonary:     Effort: Pulmonary effort is normal.     Breath sounds: Normal breath sounds.  Abdominal:     General: Bowel sounds are normal.     Palpations: Abdomen is soft.     Tenderness: There is no abdominal tenderness.  Neurological:  Mental Status: She is alert and oriented to person, place, and time.  Psychiatric:        Mood and Affect: Mood normal.        Behavior: Behavior normal.     Diabetic Foot Exam - Simple   No data filed      Lab Results  Component Value Date   WBC 9.9 05/05/2024   HGB 14.1 05/05/2024   HCT 41.8 05/05/2024   PLT 249 05/05/2024   GLUCOSE 102 (H) 05/05/2024   CHOL 177 04/08/2024   TRIG 157 (H) 04/08/2024   HDL 42 04/08/2024   LDLCALC 107 (H) 04/08/2024   ALT 20 05/05/2024   AST 18 05/05/2024    NA 143 05/05/2024   K 3.9 05/05/2024   CL 107 05/05/2024   CREATININE 0.88 05/05/2024   BUN 8 05/05/2024   CO2 24 05/05/2024   TSH 0.688 01/03/2024   HGBA1C 5.5 01/03/2024      Assessment & Plan:  Multiple lung nodules on CT Assessment & Plan: Lung nodules on CT. Hematologist suggested potential early lung cancer. Awaiting pulmonologist's recommendations. - Order repeat chest CT in 3-6 months. - Refer to pulmonologist for further evaluation.   Liver nodule Assessment & Plan: Liver nodules on CT. Hematologist mentioned possible early cirrhosis. Iron levels normalized, biopsy not planned. Repeat CT in 3-6 months   Severe obesity (BMI 35.0-35.9 with comorbidity) (HCC) Assessment & Plan: No side effects of medicine Increased Vyvanse  to 40mg  Continue to monitor for any side effects Will adjust treatment as needed   Orders: -     Lisdexamfetamine Dimesylate ; Take 1 capsule (40 mg total) by mouth every morning.  Dispense: 60 capsule; Refill: 0  Tobacco use disorder, continuous Assessment & Plan: Resumed smoking due to stress. Previously used Chantix  successfully. Discussed restarting Chantix  and potential Wellbutrin  for cravings. - Prescribe Chantix , starting with 0.5 mg for one week, then increase to 1 mg. - Discuss potential side effects of Chantix , including nausea and vivid dreams.  Orders: -     Varenicline  Tartrate; Take 1 tablet (0.5 mg total) by mouth 2 (two) times daily.  Dispense: 60 tablet; Refill: 1  Mild recurrent major depression (HCC) Assessment & Plan: On 150 mg Wellbutrin  without side effects. Plan to increase dose to 300 mg to improve symptoms. - Monitor for side effects and adjust dosage as needed.       Meds ordered this encounter  Medications   lisdexamfetamine (VYVANSE ) 40 MG capsule    Sig: Take 1 capsule (40 mg total) by mouth every morning.    Dispense:  60 capsule    Refill:  0   varenicline  (CHANTIX ) 0.5 MG tablet    Sig: Take 1  tablet (0.5 mg total) by mouth 2 (two) times daily.    Dispense:  60 tablet    Refill:  1    No orders of the defined types were placed in this encounter.    Follow-up: Return in about 2 months (around 07/07/2024) for Mirian Ames, cpe.   I,Katherina A Bramblett,acting as a scribe for Odilia Bennett, PA.,have documented all relevant documentation on the behalf of Odilia Bennett, PA,as directed by  Odilia Bennett, PA while in the presence of Odilia Bennett, [place].   An After Visit Summary was printed and given to the patient.  Odilia Bennett, [place] [facility] [phone]

## 2024-05-07 NOTE — Patient Instructions (Signed)
 VISIT SUMMARY:  During today's visit, we discussed your recent hematology and CT scan results, medication management, and smoking cessation. We reviewed the findings of nodules on your liver and lungs, and discussed your current medications and their effectiveness. We also talked about your recent stress related to your brother's heart attack and your plans to quit smoking again.  YOUR PLAN:  -LUNG NODULES: Lung nodules are small growths in the lungs that can be benign or a sign of early lung cancer. We will order a repeat chest CT scan in 3-6 months and refer you to a pulmonologist for further evaluation.  -LIVER NODULES: Liver nodules are small growths in the liver that can be benign or a sign of early cirrhosis. Your iron levels have normalized, so a biopsy is not planned at this time.  -SMOKING CESSATION: You have resumed smoking due to stress but have successfully used Chantix  in the past to quit. We will prescribe Chantix , starting with 0.5 mg for one week, then increasing to 1 mg. Be aware of potential side effects like nausea and vivid dreams.  -DEPRESSION: Depression is a mood disorder that causes persistent feelings of sadness and loss of interest. You are currently on 150 mg of Wellbutrin  without side effects. We will increase your dose to 300 mg to help improve your symptoms. Please monitor for any side effects and let us  know if you experience any.  INSTRUCTIONS:  Please schedule a follow-up appointment with a pulmonologist for further evaluation of your lung nodules. Additionally, we will need to repeat your chest CT scan in 3-6 months. Continue taking your medications as prescribed and monitor for any side effects. If you have any concerns or experience any new symptoms, please contact our office.

## 2024-05-13 ENCOUNTER — Encounter: Payer: Self-pay | Admitting: Physician Assistant

## 2024-05-14 ENCOUNTER — Encounter: Payer: Self-pay | Admitting: Internal Medicine

## 2024-05-14 ENCOUNTER — Ambulatory Visit: Admitting: Internal Medicine

## 2024-05-14 VITALS — BP 118/84 | HR 92 | Temp 98.6°F | Ht 66.0 in | Wt 223.4 lb

## 2024-05-14 DIAGNOSIS — R918 Other nonspecific abnormal finding of lung field: Secondary | ICD-10-CM

## 2024-05-14 DIAGNOSIS — R5381 Other malaise: Secondary | ICD-10-CM

## 2024-05-14 DIAGNOSIS — F17210 Nicotine dependence, cigarettes, uncomplicated: Secondary | ICD-10-CM

## 2024-05-14 DIAGNOSIS — E669 Obesity, unspecified: Secondary | ICD-10-CM | POA: Diagnosis not present

## 2024-05-14 DIAGNOSIS — G4733 Obstructive sleep apnea (adult) (pediatric): Secondary | ICD-10-CM

## 2024-05-14 DIAGNOSIS — Z6836 Body mass index (BMI) 36.0-36.9, adult: Secondary | ICD-10-CM

## 2024-05-14 DIAGNOSIS — R0602 Shortness of breath: Secondary | ICD-10-CM | POA: Diagnosis not present

## 2024-05-14 NOTE — Progress Notes (Signed)
 Name: Nichole Gonzalez MRN: [id number] DOB: 04-Oct-1976    CHIEF COMPLAINT:  Assessment of OSA Assessment of COPD Tobacco abuse   HISTORY OF PRESENT ILLNESS: Patient is seen today for problems and issues with sleep related to excessive daytime sleepiness Patient  has been having sleep problems for many years Patient has been having excessive daytime sleepiness for a long time Patient has been having extreme fatigue and tiredness, lack of energy  Discussed sleep data and reviewed with patient.  Encouraged proper weight management.  Discussed driving precautions and its relationship with hypersomnolence.  Discussed operating dangerous equipment and its relationship with hypersomnolence.   2024 HST shows AHI of 18.2 Patient did not start CPAP therapy until recently Patient started 2 weeks ago Patient uses and benefits from therapy Using CPAP nightly and with naps Pressure setting is comfortable and is sleeping well. Uses fullface mask Auto CPAP 5-20 AHI reduced to 2.7 100% compliance over the last 2 weeks greater than 6 hours of usage   Assessment of COPD Patient has chronic intermittent shortness of breath and dyspnea on exertion More than 2025 patient had acute bronchitis was given prednisone  and antibiotics which helped her symptoms Patient underwent chest x-ray and CT scan which showed multiple subcentimeter nodules Due to her smoking history we will need to follow-up CT chest Patient continues to take albuterol  as needed No maintenance therapy at this time  No exacerbation at this time No evidence of heart failure at this time No evidence or signs of infection at this time No respiratory distress No fevers, chills, nausea, vomiting, diarrhea No evidence of lower extremity edema No evidence hemoptysis  Smoking history 1 pack a day for the last 50 years Patient currently on Chantix  and is willing to stop smoking I told her we need to obtain pulmonary function  testing to assess lung function with probable underlying COPD   CT chest March 2025 independently reviewed by me today Right middle lobe opacification nodular opacity Follow-up CT chest in 1 month recommended  PAST MEDICAL HISTORY :   has a past medical history of Abnormal thyroid  stimulating hormone (TSH) level (02/17/2015), Abnormal uterine bleeding (10/07/2020), Allergic rhinitis (09/03/2015), Depression, recurrent (HCC) (09/08/2020), Gastroesophageal reflux disease (10/27/2020), High risk sexual behavior (11/06/2020), 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  has a past surgical history that includes Knee arthroscopy; Tubal ligation; right foot; Joint replacement; Abdominal hysterectomy; Appendectomy; Cholecystectomy; and Dilation and curettage, diagnostic / therapeutic. Prior to Admission medications   Medication Sig Start Date End Date Taking? Authorizing Provider  albuterol  (VENTOLIN  HFA) 108 (90 Base) MCG/ACT inhaler Inhale 2 puffs into the lungs every 6 (six) hours as needed for wheezing or shortness of breath. 04/08/24   Odilia Bennett, PA  Ascorbic Acid (VITAMIN C PO) Take by mouth.    [provider]  atorvastatin  (LIPITOR) 20 MG tablet Take 1 tablet (20 mg total) by mouth daily. 01/07/24   Odilia Bennett, PA  buPROPion  (WELLBUTRIN  XL) 150 MG 24 hr tablet Take 3 tablets (450 mg total) by mouth daily. 03/12/24   Odilia Bennett, PA  celecoxib  (CELEBREX ) 100 MG capsule Take 1 capsule (100 mg total) by mouth 2 (two) times daily. 07/22/23   Hassan Links, MD  cetirizine  (ZYRTEC  ALLERGY) 10 MG tablet Take 1 tablet (10 mg total) by mouth daily. 05/21/23   CoxBurleigh Carp, MD  cevimeline  (EVOXAC ) 30 MG capsule Take 1 capsule (30 mg total) by mouth 3 (three) times daily. 01/07/24  Odilia Bennett, PA  citalopram  (CELEXA ) 40 MG tablet Take 1 tablet (40 mg total) by mouth daily. 12/12/23   CoxBurleigh Carp, MD  cyclobenzaprine  (FLEXERIL ) 10 MG tablet Take 1  tablet (10 mg total) by mouth at bedtime. 02/04/24   Odilia Bennett, PA  EPIPEN 2-PAK 0.3 MG/0.3ML SOAJ injection  05/16/15   [provider]  fluticasone  (FLONASE ) 50 MCG/ACT nasal spray Place 1 spray into both nostrils once daily. 10/04/23   Cox, Burleigh Carp, MD  lisdexamfetamine (VYVANSE ) 40 MG capsule Take 1 capsule (40 mg total) by mouth every morning. 05/07/24   Odilia Bennett, PA  LORazepam  (ATIVAN ) 0.5 MG tablet Take 1 tablet (0.5 mg total) by mouth 2 (two) times daily as needed (panic attacks). 05/21/23   CoxBurleigh Carp, MD  omeprazole  (PRILOSEC) 40 MG capsule Take 1 capsule (40 mg total) by mouth daily. 03/12/24   Odilia Bennett, PA  potassium chloride  (KLOR-CON ) 10 MEQ tablet Take 1 tablet (10 mEq total) by mouth daily. 03/20/24   Hassan Links, MD  sodium fluoride  (FLUORISHIELD) 1.1 % GEL dental gel Place 1 Application onto teeth at bedtime. 03/19/23   [provider]  topiramate  (TOPAMAX ) 100 MG tablet Take 1 tablet (100 mg total) by mouth daily. 02/19/24   Odilia Bennett, PA  traZODone  (DESYREL ) 100 MG tablet Take 1 tablet (100 mg total) by mouth at bedtime. 12/12/23   CoxBurleigh Carp, MD  varenicline  (CHANTIX ) 0.5 MG tablet Take 1 tablet (0.5 mg total) by mouth 2 (two) times daily. 05/07/24   Odilia Bennett, PA   Allergies  Allergen Reactions   Morphine Anaphylaxis   Morphine And Codeine Anaphylaxis   Bee Pollen Other (See Comments)    Anaphylaxis.   Codeine Other (See Comments)    Fainting.   Rosuvastatin  Other (See Comments)    GI upset    FAMILY HISTORY:  family history includes COPD in her sister; Coronary artery disease in her father; Diabetes Mellitus II in her sister; Heart attack in her father; Thyroid  disease in her sister. SOCIAL HISTORY:  reports that she has been smoking cigarettes. She started smoking about 6 months ago. She has a 30.3 pack-year smoking history. She has never been exposed to tobacco smoke. She has never used smokeless tobacco. She reports current  alcohol use of about 8.0 standard drinks of alcohol per week. She reports that she does not use drugs.   BP 118/84 (BP Location: Right Arm, Patient Position: Sitting, Cuff Size: Large)   Pulse 92   Temp 98.6 F (37 C) (Oral)   Ht 5\' 6"  (1.676 m)   Wt 223 lb 6.4 oz (101.3 kg)   LMP 01/04/2016 (Approximate)   SpO2 97%   BMI 36.06 kg/m      Review of Systems: Gen:  Denies  fever, sweats, chills weight loss  HEENT: Denies blurred vision, double vision, ear pain, eye pain, hearing loss, nose bleeds, sore throat Cardiac:  No dizziness, chest pain or heaviness, chest tightness,edema, No JVD Resp:   No cough, -sputum production, +shortness of breath,-wheezing, -hemoptysis,  Other:  All other systems negative   Physical Examination:   General Appearance: No distress  EYES PERRLA, EOM intact.   NECK Supple, No JVD Pulmonary: normal breath sounds, No wheezing.  CardiovascularNormal S1,S2.  No m/r/g.   Abdomen: Benign, Soft, non-tender. Neurology UE/LE 5/5 strength, no focal deficits Ext pulses intact, cap refill intact ALL OTHER ROS ARE NEGATIVE     ASSESSMENT AND PLAN SYNOPSIS 48 year old pleasant white female seen today  for Patient with signs and symptoms of excessive daytime sleepiness with  underlying diagnosis of obstructive sleep apnea in the setting of obesity and deconditioned state, with ongoing tobacco abuse probable underlying COPD with a recent COPD exacerbation found to have abnormal CT chest with multiple subcentimeter pulmonary nodules   Assessment of OSA Previous AHI 18.2 Continue CPAP as prescribed  Excellent compliance report Reviewed compliance report in detail with patient Patient definitely benefits the use of CPAP therapy as prescribed Using CPAP nightly and with naps Pressure setting is comfortable and is sleeping well. CPAP prescription Auto 5-20 fullface mask AHI reduced to 2.7  No evidence of acute heart failure at this time No respiratory  distress No fevers, chills, nausea, vomiting, diarrhea No evidence hemoptysis  Patient Instructions Continue to use CPAP every night, minimum of 4-6 hours a night.  Change equipment every 30 days or as directed by DME.  Wash your tubing with warm soap and water daily, hang to dry. Wash humidifier portion weekly. Use bottled, distilled water and change daily   Be aware of reduced alertness and do not drive or operate heavy machinery if experiencing this or drowsiness.  Exercise encouraged, as tolerated. Encouraged proper weight management.  Important to get eight or more hours of sleep  Limiting the use of the computer and television before bedtime.  Decrease naps during the day, so night time sleep will become enhanced.  Limit caffeine , and sleep deprivation.  HTN, stroke, uncontrolled diabetes and heart failure are potential risk factors.  Risk of untreated sleep apnea including cardiac arrhthymias, stroke, DM, pulm HTN.    Obesity -recommend significant weight loss -recommend changing diet  Deconditioned state -Recommend increased daily activity and exercise  Assessment of COPD Recommend pulmonary function testing No maintenance therapy at this time Continue albuterol  as needed Avoid Allergens and Irritants Avoid secondhand smoke Avoid SICK contacts Recommend  Masking  when appropriate Recommend Keep up-to-date with vaccinations   Smoking Assessment and Cessation Counseling Upon further questioning, Patient smokes 1/2 ppd I have advised patient to quit/stop smoking as soon as possible due to high risk for multiple medical problems Patient is willing to quit smoking  I have advised patient that we can assist and have options of Nicotine replacement therapy. I also advised patient on behavioral therapy and can provide oral medication therapy in conjunction with the other therapies Follow up next Office visit  for assessment of smoking cessation Smoking cessation counseling  advised for >10 minutes Patient currently on Chantix  therapy  Abnormal CT chest with subcentimeter nodules Obtain CT chest in 1 month to assess interval changes     MEDICATION ADJUSTMENTS/LABS AND TESTS ORDERED: Continue CPAP Obtain pulmonary function test Smoking cessation As needed albuterol  Continue Chantix  CT chest 1 month   CURRENT MEDICATIONS REVIEWED AT LENGTH WITH PATIENT TODAY   Patient  satisfied with Plan of action and management. All questions answered  Follow up  4 weeks    I spent a total of 97 minutes reviewing chart data, face-to-face evaluation with the patient, counseling and coordination of care as detailed above.    Lady Pier, M.D.  [facility]  Medical Director [facility] Medical Director [facility] Cardio-Pulmonary Department

## 2024-05-14 NOTE — Patient Instructions (Signed)
 Excellent Job A+ GOLD STAR!!  Continue CPAP as prescribed  Patient Instructions Continue to use CPAP every night, minimum of 4-6 hours a night.  Change equipment every 30 days or as directed by DME.  Wash your tubing with warm soap and water daily, hang to dry. Wash humidifier portion weekly. Use bottled, distilled water and change daily   Be aware of reduced alertness and do not drive or operate heavy machinery if experiencing this or drowsiness.  Exercise encouraged, as tolerated. Encouraged proper weight management.  Important to get eight or more hours of sleep  Limiting the use of the computer and television before bedtime.  Decrease naps during the day, so night time sleep will become enhanced.  Limit caffeine , and sleep deprivation.    Avoid Allergens and Irritants Avoid secondhand smoke Avoid SICK contacts Recommend  Masking  when appropriate Recommend Keep up-to-date with vaccinations  RECOMMEND FOLLOW UP CT CHEST FOR NODULES in 1 MONTH PLEASE STOP SMOKING-CONTINUE CHANTIX  USE ALBUTEROL  AS NEEDED OBTAIN LUNG FUNCTION TEST

## 2024-05-19 ENCOUNTER — Other Ambulatory Visit: Payer: Self-pay

## 2024-05-19 ENCOUNTER — Other Ambulatory Visit: Payer: Self-pay | Admitting: Family Medicine

## 2024-05-19 ENCOUNTER — Other Ambulatory Visit: Payer: Self-pay | Admitting: Physician Assistant

## 2024-05-19 DIAGNOSIS — F5101 Primary insomnia: Secondary | ICD-10-CM

## 2024-05-19 MED ORDER — TOPIRAMATE 100 MG PO TABS
100.0000 mg | ORAL_TABLET | Freq: Every day | ORAL | 0 refills | Status: DC
  Filled 2024-05-19: qty 90, 90d supply, fill #0

## 2024-05-19 MED ORDER — TRAZODONE HCL 100 MG PO TABS
100.0000 mg | ORAL_TABLET | Freq: Every day | ORAL | 0 refills | Status: DC
  Filled 2024-05-19: qty 90, 90d supply, fill #0

## 2024-05-19 MED ORDER — CETIRIZINE HCL 10 MG PO TABS
10.0000 mg | ORAL_TABLET | Freq: Every day | ORAL | 3 refills | Status: AC
  Filled 2024-05-19: qty 90, 90d supply, fill #0
  Filled 2024-08-17: qty 90, 90d supply, fill #1
  Filled 2024-11-22: qty 90, 90d supply, fill #2

## 2024-05-19 NOTE — Progress Notes (Unsigned)
 Subjective:  Patient ID: Nichole Gonzalez, female    DOB: 06/15/1976  Age: 48 y.o. MRN: [id number]  No chief complaint on file.   HPI:     04/08/2024    7:41 AM 01/07/2024    8:53 AM 10/04/2023    8:35 AM 06/13/2023    8:37 AM 02/26/2023    8:32 AM  Depression screen PHQ 2/9  Decreased Interest 0 0 1 1 2   Down, Depressed, Hopeless 0 0 0 3 2  PHQ - 2 Score 0 0 1 4 4   Altered sleeping 0 0 0 1 3  Tired, decreased energy 3 2 1 1 3   Change in appetite 0 3 1 3  0  Feeling bad or failure about yourself  0 0 0 3 0  Trouble concentrating 0 0 0 3 3  Moving slowly or fidgety/restless 0 0 0 2 0  Suicidal thoughts 0 0 0 0 0  PHQ-9 Score 3 5 3 17 13   Difficult doing work/chores Somewhat difficult Somewhat difficult Not difficult at all Very difficult Somewhat difficult        04/08/2024    7:41 AM  Fall Risk   Falls in the past year? 1  Number falls in past yr: 0  Injury with Fall? 1  Risk for fall due to : No Fall Risks  Follow up Falls evaluation completed    Patient Care Team: Odilia Bennett, [place] as PCP - General (Physician Assistant)   Review of Systems  Constitutional:  Negative for appetite change, fatigue and fever.  HENT:  Negative for congestion, ear pain, sinus pressure and sore throat.   Respiratory:  Negative for cough, chest tightness, shortness of breath and wheezing.   Cardiovascular:  Negative for chest pain and palpitations.  Gastrointestinal:  Negative for abdominal pain, constipation, diarrhea, nausea and vomiting.  Genitourinary:  Negative for dysuria and hematuria.  Musculoskeletal:  Negative for arthralgias, back pain, joint swelling and myalgias.  Skin:  Negative for rash.  Neurological:  Negative for dizziness, weakness and headaches.  Psychiatric/Behavioral:  Negative for dysphoric mood. The patient is not nervous/anxious.     Current Outpatient Medications on File Prior to Visit  Medication Sig Dispense Refill   albuterol  (VENTOLIN  HFA) 108 (90 Base)  MCG/ACT inhaler Inhale 2 puffs into the lungs every 6 (six) hours as needed for wheezing or shortness of breath. 6.7 g 2   Ascorbic Acid (VITAMIN C PO) Take by mouth.     atorvastatin  (LIPITOR) 20 MG tablet Take 1 tablet (20 mg total) by mouth daily. 90 tablet 3   buPROPion  (WELLBUTRIN  XL) 150 MG 24 hr tablet Take 3 tablets (450 mg total) by mouth daily. 270 tablet 3   celecoxib  (CELEBREX ) 100 MG capsule Take 1 capsule (100 mg total) by mouth 2 (two) times daily. 20 capsule 0   cetirizine  (ZYRTEC  ALLERGY) 10 MG tablet Take 1 tablet (10 mg total) by mouth daily. 90 tablet 3   cevimeline  (EVOXAC ) 30 MG capsule Take 1 capsule (30 mg total) by mouth 3 (three) times daily. 90 capsule 3   citalopram  (CELEXA ) 40 MG tablet Take 1 tablet (40 mg total) by mouth daily. 90 tablet 1   cyclobenzaprine  (FLEXERIL ) 10 MG tablet Take 1 tablet (10 mg total) by mouth at bedtime. 90 tablet 0   EPIPEN 2-PAK 0.3 MG/0.3ML SOAJ injection   0   fluticasone  (FLONASE ) 50 MCG/ACT nasal spray Place 1 spray into both nostrils once daily. 48 g 3   lisdexamfetamine (  VYVANSE ) 40 MG capsule Take 1 capsule (40 mg total) by mouth every morning. 60 capsule 0   LORazepam  (ATIVAN ) 0.5 MG tablet Take 1 tablet (0.5 mg total) by mouth 2 (two) times daily as needed (panic attacks). 30 tablet 1   omeprazole  (PRILOSEC) 40 MG capsule Take 1 capsule (40 mg total) by mouth daily. 90 capsule 3   potassium chloride  (KLOR-CON ) 10 MEQ tablet Take 1 tablet (10 mEq total) by mouth daily. 90 tablet 1   sodium fluoride  (FLUORISHIELD) 1.1 % GEL dental gel Place 1 Application onto teeth at bedtime.     topiramate  (TOPAMAX ) 100 MG tablet Take 1 tablet (100 mg total) by mouth daily. 90 tablet 0   traZODone  (DESYREL ) 100 MG tablet Take 1 tablet (100 mg total) by mouth at bedtime. 90 tablet 0   varenicline  (CHANTIX ) 0.5 MG tablet Take 1 tablet (0.5 mg total) by mouth 2 (two) times daily. 60 tablet 1   No current facility-administered medications on file  prior to visit.   Past Medical History:  Diagnosis Date   Abnormal thyroid  stimulating hormone (TSH) level 02/17/2015   Abnormal uterine bleeding 10/07/2020   Allergic rhinitis 09/03/2015   Depression, recurrent (HCC) 09/08/2020   Gastroesophageal reflux disease 10/27/2020   High risk sexual behavior 11/06/2020   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   Primary insomnia 09/08/2020   Tobacco abuse 09/03/2015   Past Surgical History:  Procedure Laterality Date   ABDOMINAL HYSTERECTOMY     APPENDECTOMY     CHOLECYSTECTOMY     DILATION AND CURETTAGE, DIAGNOSTIC / THERAPEUTIC     JOINT REPLACEMENT     KNEE ARTHROSCOPY     right foot     TUBAL LIGATION      Family History  Problem Relation Age of Onset   Thyroid  disease Sister    Coronary artery disease Father    Heart attack Father    COPD Sister    Diabetes Mellitus II Sister    Social History   Socioeconomic History   Marital status: Married    Spouse name: Not on file   Number of children: 3   Years of education: Not on file   Highest education level: Bachelor's degree (e.g., BA, AB, BS)  Occupational History   Not on file  Tobacco Use   Smoking status: Every Day    Current packs/day: 0.50    Average packs/day: 1 pack/day for 30.6 years (30.3 ttl pk-yrs)    Types: Cigarettes    Start date: 10/25/2023    Passive exposure: Never   Smokeless tobacco: Never  Vaping Use   Vaping status: Never Used  Substance and Sexual Activity   Alcohol use: Yes    Alcohol/week: 8.0 standard drinks of alcohol    Types: 8 Standard drinks or equivalent per week    Comment: socially   Drug use: Never   Sexual activity: Not on file  Other Topics Concern   Not on file  Social History Narrative   ** Merged History Encounter **       Social Drivers of Health   Financial Resource Strain: Low Risk  (01/28/2024)   Received from [facility]   Overall Financial Resource Strain (CARDIA)    Difficulty of Paying Living Expenses: Not  very hard  Food Insecurity: No Food Insecurity (01/28/2024)   Received from [facility]   Hunger Vital Sign    Worried About Running Out of Food in the Last Year: Never true  Ran Out of Food in the Last Year: Never true  Transportation Needs: No Transportation Needs (01/28/2024)   Received from [facility] - Transportation    In the past 12 months, has lack of transportation kept you from medical appointments or from getting medications?: No    Lack of Transportation (Non-Medical): No  Physical Activity: Inactive (01/07/2024)   Exercise Vital Sign    Days of Exercise per Week: 0 days    Minutes of Exercise per Session: 30 min  Stress: Stress Concern Present (01/07/2024)   [employer] of Occupational Health - Occupational Stress Questionnaire    Feeling of Stress : To some extent  Social Connections: Moderately Isolated (01/07/2024)   Social Connection and Isolation Panel [NHANES]    Frequency of Communication with Friends and Family: More than three times a week    Frequency of Social Gatherings with Friends and Family: More than three times a week    Attends Religious Services: Never    Database administrator or Organizations: No    Attends Banker Meetings: Not on file    Marital Status: Married    Objective:  LMP 01/04/2016 (Approximate)      05/14/2024    9:46 AM 05/07/2024    7:37 AM 05/05/2024    3:19 PM  BP/Weight  Systolic BP 118 108 115  Diastolic BP 84 70 73  Wt. (Lbs) 223.4 221 221.5  BMI 36.06 kg/m2 35.67 kg/m2 35.75 kg/m2    Physical Exam  Diabetic Foot Exam - Simple   No data filed      Lab Results  Component Value Date   WBC 9.9 05/05/2024   HGB 14.1 05/05/2024   HCT 41.8 05/05/2024   PLT 249 05/05/2024   GLUCOSE 102 (H) 05/05/2024   CHOL 177 04/08/2024   TRIG 157 (H) 04/08/2024   HDL 42 04/08/2024   LDLCALC 107 (H) 04/08/2024   ALT 20 05/05/2024   AST 18 05/05/2024   NA 143  05/05/2024   K 3.9 05/05/2024   CL 107 05/05/2024   CREATININE 0.88 05/05/2024   BUN 8 05/05/2024   CO2 24 05/05/2024   TSH 0.688 01/03/2024   HGBA1C 5.5 01/03/2024      Assessment & Plan:  There are no diagnoses linked to this encounter.   No orders of the defined types were placed in this encounter.   No orders of the defined types were placed in this encounter.    Follow-up: No follow-ups on file.   I,Lauren M Auman,acting as a Neurosurgeon for [employer], [employer].,have documented all relevant documentation on the behalf of Odilia Bennett, PA,as directed by  Odilia Bennett, PA while in the presence of Odilia Bennett, [place].   An After Visit Summary was printed and given to the patient.  Odilia Bennett, [place] [facility] [phone]

## 2024-05-20 ENCOUNTER — Encounter: Payer: Self-pay | Admitting: Physician Assistant

## 2024-05-20 ENCOUNTER — Ambulatory Visit: Admitting: Physician Assistant

## 2024-05-20 ENCOUNTER — Other Ambulatory Visit: Payer: Self-pay

## 2024-05-20 VITALS — BP 104/70 | HR 86 | Temp 97.6°F | Ht 66.0 in | Wt 222.6 lb

## 2024-05-20 DIAGNOSIS — M542 Cervicalgia: Secondary | ICD-10-CM

## 2024-05-20 DIAGNOSIS — K580 Irritable bowel syndrome with diarrhea: Secondary | ICD-10-CM | POA: Insufficient documentation

## 2024-05-20 DIAGNOSIS — G4733 Obstructive sleep apnea (adult) (pediatric): Secondary | ICD-10-CM | POA: Diagnosis not present

## 2024-05-20 MED ORDER — DICYCLOMINE HCL 20 MG PO TABS
20.0000 mg | ORAL_TABLET | Freq: Four times a day (QID) | ORAL | 3 refills | Status: AC
  Filled 2024-05-20: qty 360, 90d supply, fill #0
  Filled 2024-08-13: qty 360, 90d supply, fill #1
  Filled 2025-01-06: qty 360, 90d supply, fill #2

## 2024-05-20 NOTE — Assessment & Plan Note (Signed)
 Controlled Continue taking Dicyclomine  20mg  as prescribed Denies any new or abnormal symptoms Denies any major side effects with the medicine Will adjust treatment based on symptoms

## 2024-05-20 NOTE — Assessment & Plan Note (Signed)
 Obstructive sleep apnea well-managed with CPAP therapy, significant reduction in apnea episodes. CPAP equipment requires adjustment. - Continue CPAP therapy as prescribed. - Coordinate with CPAP provider and insurance for equipment adjustments.

## 2024-05-20 NOTE — Assessment & Plan Note (Signed)
 Chronic neck pain with left-sided muscle tightness, stress and poor posture exacerbation, headache radiation. Differential includes muscle strain and nerve involvement. Discussed vasovagal reaction risk during therapy. - Refer to physical therapy in [place] for muscle tightness and pain management. - Advise use of heat and ice for symptomatic relief. - Consider further imaging or spine surgeon referral if no improvement after one month of physical therapy.

## 2024-05-28 DIAGNOSIS — G4733 Obstructive sleep apnea (adult) (pediatric): Secondary | ICD-10-CM | POA: Diagnosis not present

## 2024-05-29 DIAGNOSIS — G4733 Obstructive sleep apnea (adult) (pediatric): Secondary | ICD-10-CM | POA: Diagnosis not present

## 2024-06-03 ENCOUNTER — Inpatient Hospital Stay (HOSPITAL_BASED_OUTPATIENT_CLINIC_OR_DEPARTMENT_OTHER): Admission: RE | Admit: 2024-06-03 | Source: Ambulatory Visit | Admitting: Radiology

## 2024-06-05 ENCOUNTER — Ambulatory Visit (HOSPITAL_BASED_OUTPATIENT_CLINIC_OR_DEPARTMENT_OTHER)
Admission: RE | Admit: 2024-06-05 | Discharge: 2024-06-05 | Disposition: A | Discharge status: Home / Self Care | Source: Ambulatory Visit | Attending: Internal Medicine | Admitting: Internal Medicine

## 2024-06-05 DIAGNOSIS — R918 Other nonspecific abnormal finding of lung field: Secondary | ICD-10-CM

## 2024-06-05 DIAGNOSIS — J8410 Pulmonary fibrosis, unspecified: Secondary | ICD-10-CM | POA: Diagnosis not present

## 2024-06-09 ENCOUNTER — Other Ambulatory Visit: Payer: Self-pay | Admitting: Family Medicine

## 2024-06-09 ENCOUNTER — Other Ambulatory Visit: Payer: Self-pay

## 2024-06-09 ENCOUNTER — Other Ambulatory Visit: Payer: Self-pay | Admitting: Cardiology

## 2024-06-09 DIAGNOSIS — M2559 Pain in other specified joint: Secondary | ICD-10-CM

## 2024-06-09 MED FILL — Omeprazole Cap Delayed Release 40 MG: ORAL | 90 days supply | Qty: 90 | Fill #1 | Status: AC

## 2024-06-09 MED FILL — Cyclobenzaprine HCl Tab 10 MG: ORAL | 90 days supply | Qty: 90 | Fill #0 | Status: AC

## 2024-06-10 ENCOUNTER — Other Ambulatory Visit: Payer: Self-pay

## 2024-06-10 NOTE — Telephone Encounter (Signed)
 Pt of Dr. Sandee Crook. Please advise on if Dr. Sandee Crook wants to refill this RX.

## 2024-06-11 ENCOUNTER — Other Ambulatory Visit: Payer: Self-pay

## 2024-06-12 ENCOUNTER — Other Ambulatory Visit: Payer: Self-pay

## 2024-06-12 ENCOUNTER — Other Ambulatory Visit: Payer: Self-pay | Admitting: Physician Assistant

## 2024-06-14 MED FILL — Celecoxib Cap 100 MG: ORAL | 10 days supply | Qty: 20 | Fill #0 | Status: AC

## 2024-06-15 ENCOUNTER — Other Ambulatory Visit: Payer: Self-pay

## 2024-06-16 ENCOUNTER — Other Ambulatory Visit: Payer: Self-pay

## 2024-06-16 ENCOUNTER — Other Ambulatory Visit: Payer: Self-pay | Admitting: Family Medicine

## 2024-06-16 MED ORDER — CITALOPRAM HYDROBROMIDE 40 MG PO TABS
40.0000 mg | ORAL_TABLET | Freq: Every day | ORAL | 1 refills | Status: DC
  Filled 2024-06-16: qty 90, 90d supply, fill #0
  Filled 2024-09-22: qty 90, 90d supply, fill #1

## 2024-06-16 MED FILL — Bupropion HCl Tab ER 24HR 150 MG: ORAL | 90 days supply | Qty: 270 | Fill #1 | Status: AC

## 2024-06-25 ENCOUNTER — Ambulatory Visit: Admitting: Internal Medicine

## 2024-06-25 ENCOUNTER — Encounter: Payer: Self-pay | Admitting: Internal Medicine

## 2024-06-25 VITALS — BP 110/70 | HR 87 | Temp 98.2°F | Ht 66.0 in | Wt 225.6 lb

## 2024-06-25 DIAGNOSIS — R918 Other nonspecific abnormal finding of lung field: Secondary | ICD-10-CM | POA: Diagnosis not present

## 2024-06-25 DIAGNOSIS — G4733 Obstructive sleep apnea (adult) (pediatric): Secondary | ICD-10-CM

## 2024-06-25 DIAGNOSIS — R0602 Shortness of breath: Secondary | ICD-10-CM | POA: Diagnosis not present

## 2024-06-25 LAB — PULMONARY FUNCTION TEST
FEF 25-75 Post: 2.57 L/s
FEF 25-75 Pre: 2.86 L/s
FEF2575-%Change-Post: -10 %
FEF2575-%Pred-Post: 88 %
FEF2575-%Pred-Pre: 98 %
FEV1-%Change-Post: -2 %
FEV1-%Pred-Post: 93 %
FEV1-%Pred-Pre: 96 %
FEV1-Post: 2.83 L
FEV1-Pre: 2.9 L
FEV1FVC-%Change-Post: -1 %
FEV1FVC-%Pred-Pre: 99 %
FEV6-%Change-Post: 0 %
FEV6-%Pred-Post: 95 %
FEV6-%Pred-Pre: 96 %
FEV6-Post: 3.55 L
FEV6-Pre: 3.58 L
FEV6FVC-%Change-Post: 0 %
FEV6FVC-%Pred-Post: 102 %
FEV6FVC-%Pred-Pre: 101 %
FVC-%Change-Post: -1 %
FVC-%Pred-Post: 93 %
FVC-%Pred-Pre: 95 %
FVC-Post: 3.57 L
FVC-Pre: 3.62 L
Post FEV1/FVC ratio: 79 %
Post FEV6/FVC ratio: 99 %
Pre FEV1/FVC ratio: 80 %
Pre FEV6/FVC Ratio: 99 %
RV % pred: 83 %
RV: 1.55 L
TLC % pred: 88 %
TLC: 4.74 L

## 2024-06-25 NOTE — Patient Instructions (Signed)
 Full PFT performed without DLCO.

## 2024-06-25 NOTE — Progress Notes (Signed)
 Name: Nichole Gonzalez MRN: [id number] DOB: 06-28-1976    CHIEF COMPLAINT:  Follow-up assessment for OSA Follow-up assessment for COPD Tobacco abuse   HISTORY OF PRESENT ILLNESS: Follow-up assessment for OSA 2024 HST shows AHI of 18.2 Uses fullface mask Auto CPAP 5-20 AHI reduced to 2 100% compliance for days and greatest greater than 4 hours of usage Patient benefiting from CPAP therapy  Discussed sleep data and reviewed with patient.  Encouraged proper weight management.  Discussed driving precautions and its relationship with hypersomnolence.  Discussed sleep hygiene, and benefits of a fixed sleep waked time.  The importance of getting eight or more hours of sleep discussed with patient.  Discussed limiting the use of the computer and television before bedtime.  Decrease naps during the day, so night time sleep will become enhanced.  Limit caffeine , and sleep deprivation.   Patient uses and benefits from therapy Using CPAP nightly and with naps Pressure setting is comfortable and is sleeping well.    Assessment of COPD Patient has chronic intermittent shortness of breath and dyspnea on exertion More than 2025 patient had acute bronchitis was given prednisone  and antibiotics which helped her symptoms No exacerbation at this time No evidence of heart failure at this time No evidence or signs of infection at this time No respiratory distress No fevers, chills, nausea, vomiting, diarrhea No evidence of lower extremity edema No evidence hemoptysis No maintenance inhalers at this time   Pulmonary function testing June 25, 2024 Findings reviewed with patient in detail Postbronchodilator FEV1 FVC ratio 79% predicted FEV1 is 93% predicted FVC is 93% predicted TLC is 88% predicted RV is 83% predicted No significant bronchodilator response No significant abnormalities with the flow-volume loops DLCO machine is not working Overall interpretation is normal  pulmonary function testing  Smoking history 1 pack a day for the last 20 years Patient currently on Chantix  and is willing to stop smoking Patient continues to smoke but has cut down Smoking session strongly advised Risks explained to patient  Follow-up assessment for pulm nodules Repeat CT chest June 2025 2 subcentimeter nodule 7 mm and 4 mm stable  Right upper lobe groundglass opacity resolved       CT chest March 2025 independently reviewed by me today Right middle lobe opacification nodular opacity Follow-up CT chest in 1 month recommended  PAST MEDICAL HISTORY :   has a past medical history of Abnormal thyroid  stimulating hormone (TSH) level (02/17/2015), Abnormal uterine bleeding (10/07/2020), Allergic rhinitis (09/03/2015), Depression, recurrent (HCC) (09/08/2020), Gastroesophageal reflux disease (10/27/2020), High risk sexual behavior (11/06/2020), 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  has a past surgical history that includes Knee arthroscopy; Tubal ligation; right foot; Joint replacement; Abdominal hysterectomy; Appendectomy; Cholecystectomy; and Dilation and curettage, diagnostic / therapeutic. Prior to Admission medications   Medication Sig Start Date End Date Taking? Authorizing Provider  albuterol  (VENTOLIN  HFA) 108 (90 Base) MCG/ACT inhaler Inhale 2 puffs into the lungs every 6 (six) hours as needed for wheezing or shortness of breath. 04/08/24   Milon Cleaves, PA  Ascorbic Acid (VITAMIN C PO) Take by mouth.    [provider]  atorvastatin  (LIPITOR) 20 MG tablet Take 1 tablet (20 mg total) by mouth daily. 01/07/24   Milon Cleaves, PA  buPROPion  (WELLBUTRIN  XL) 150 MG 24 hr tablet Take 3 tablets (450 mg total) by mouth daily. 03/12/24   Milon Cleaves, PA  celecoxib  (CELEBREX ) 100 MG capsule Take 1 capsule (100 mg total) by mouth  2 (two) times daily. 07/22/23   Monetta Redell PARAS, MD  cetirizine  (ZYRTEC  ALLERGY) 10 MG  tablet Take 1 tablet (10 mg total) by mouth daily. 05/21/23   CoxAbigail, MD  cevimeline  (EVOXAC ) 30 MG capsule Take 1 capsule (30 mg total) by mouth 3 (three) times daily. 01/07/24   Milon Cleaves, PA  citalopram  (CELEXA ) 40 MG tablet Take 1 tablet (40 mg total) by mouth daily. 12/12/23   CoxAbigail, MD  cyclobenzaprine  (FLEXERIL ) 10 MG tablet Take 1 tablet (10 mg total) by mouth at bedtime. 02/04/24   Milon Cleaves, PA  EPIPEN 2-PAK 0.3 MG/0.3ML SOAJ injection  05/16/15   [provider]  fluticasone  (FLONASE ) 50 MCG/ACT nasal spray Place 1 spray into both nostrils once daily. 10/04/23   CoxAbigail, MD  lisdexamfetamine (VYVANSE ) 40 MG capsule Take 1 capsule (40 mg total) by mouth every morning. 05/07/24   Milon Cleaves, PA  LORazepam  (ATIVAN ) 0.5 MG tablet Take 1 tablet (0.5 mg total) by mouth 2 (two) times daily as needed (panic attacks). 05/21/23   CoxAbigail, MD  omeprazole  (PRILOSEC) 40 MG capsule Take 1 capsule (40 mg total) by mouth daily. 03/12/24   Milon Cleaves, PA  potassium chloride  (KLOR-CON ) 10 MEQ tablet Take 1 tablet (10 mEq total) by mouth daily. 03/20/24   Monetta Redell PARAS, MD  sodium fluoride  (FLUORISHIELD) 1.1 % GEL dental gel Place 1 Application onto teeth at bedtime. 03/19/23   [provider]  topiramate  (TOPAMAX ) 100 MG tablet Take 1 tablet (100 mg total) by mouth daily. 02/19/24   Milon Cleaves, PA  traZODone  (DESYREL ) 100 MG tablet Take 1 tablet (100 mg total) by mouth at bedtime. 12/12/23   CoxAbigail, MD  varenicline  (CHANTIX ) 0.5 MG tablet Take 1 tablet (0.5 mg total) by mouth 2 (two) times daily. 05/07/24   Milon Cleaves, PA   Allergies  Allergen Reactions   Morphine Anaphylaxis   Morphine And Codeine Anaphylaxis   Bee Pollen Other (See Comments)    Anaphylaxis.   Codeine Other (See Comments)    Fainting.   Rosuvastatin  Other (See Comments)    GI upset    FAMILY HISTORY:  family history includes COPD in her sister; Coronary artery disease in her  father; Diabetes Mellitus II in her sister; Heart attack in her father; Thyroid  disease in her sister. SOCIAL HISTORY:  reports that she has been smoking cigarettes. She started smoking about 8 months ago. She has a 30.3 pack-year smoking history. She has never been exposed to tobacco smoke. She has never used smokeless tobacco. She reports current alcohol use of about 8.0 standard drinks of alcohol per week. She reports that she does not use drugs.   LMP 01/04/2016 (Approximate)   BP 110/70 (BP Location: Right Arm, Patient Position: Sitting, Cuff Size: Large)   Pulse 87   Temp 98.2 F (36.8 C) (Oral)   Ht 5' 6 (1.676 m)   Wt 225 lb 9.6 oz (102.3 kg)   LMP 01/04/2016 (Approximate)   SpO2 97%   BMI 36.41 kg/m        Review of Systems: Gen:  Denies  fever, sweats, chills weight loss  HEENT: Denies blurred vision, double vision, ear pain, eye pain, hearing loss, nose bleeds, sore throat Cardiac:  No dizziness, chest pain or heaviness, chest tightness,edema, No JVD Resp:   No cough, -sputum production, -shortness of breath,-wheezing, -hemoptysis,  Other:  All other systems negative   Physical Examination:   General Appearance: No distress  EYES PERRLA, EOM intact.   NECK Supple, No JVD Pulmonary: normal breath sounds, No wheezing.  CardiovascularNormal S1,S2.  No m/r/g.   Abdomen: Benign, Soft, non-tender. Neurology UE/LE 5/5 strength, no focal deficits Ext pulses intact, cap refill intact ALL OTHER ROS ARE NEGATIVE      ASSESSMENT AND PLAN SYNOPSIS 47 year old pleasant white female seen today for follow-up assessment for sleep apnea in the setting of obesity and deconditioned state with ongoing tobacco abuse with underlying rare intermittent reactive airways disease likely related to smoking with normal pulmonary function testing with abnormal CT chest with multiple subcentimeter pulmonary nodules that are stable   Assessment of OSA Previous AHI 24 Continue CPAP as  prescribed  Excellent compliance report Reviewed compliance report in detail with patient Patient definitely benefits the use of CPAP therapy as prescribed Using CPAP nightly and with naps Pressure setting is comfortable and is sleeping well. CPAP prescription 5-20 fullface mask AHI reduced to 2  No evidence of acute heart failure at this time No respiratory distress No fevers, chills, nausea, vomiting, diarrhea No evidence hemoptysis  Patient Instructions Continue to use CPAP every night, minimum of 4-6 hours a night.  Change equipment every 30 days or as directed by DME.  Wash your tubing with warm soap and water daily, hang to dry. Wash humidifier portion weekly. Use bottled, distilled water and change daily   Be aware of reduced alertness and do not drive or operate heavy machinery if experiencing this or drowsiness.  Exercise encouraged, as tolerated. Encouraged proper weight management.  Important to get eight or more hours of sleep  Limiting the use of the computer and television before bedtime.  Decrease naps during the day, so night time sleep will become enhanced.  Limit caffeine , and sleep deprivation.  HTN, stroke, uncontrolled diabetes and heart failure are potential risk factors.  Risk of untreated sleep apnea including cardiac arrhthymias, stroke, DM, pulm HTN.    Assessment of shortness of breath Likely related to morbid obesity as well as intermittent reactive airways disease from smoking Pulmonary function testing is within normal limits no obvious obstructive lung disease or restrictive lung disease Smoking cessation strongly advised Use albuterol  as needed Avoid Allergens and Irritants Avoid secondhand smoke Avoid SICK contacts Recommend  Masking  when appropriate Recommend Keep up-to-date with vaccinations   Smoking Assessment and Cessation Counseling Upon further questioning, Patient smokes 1/2 popd I have advised patient to quit/stop smoking as soon  as possible due to high risk for multiple medical problems Patient is willing to quit smoking I have advised patient that we can assist and have options of Nicotine replacement therapy. I also advised patient on behavioral therapy and can provide oral medication therapy in conjunction with the other therapies Follow up next Office visit  for assessment of smoking cessation Smoking cessation counseling advised for >10 minutes  Abnormal CT chest with subcentimeter nodules Repeat CT chest shows residual 7 mm and 4 mm nodule which is stable Right upper lobe groundglass opacification has resolved Patient will turn 48 next year therefore will enroll in the lung cancer screening program      MEDICATION ADJUSTMENTS/LABS AND TESTS ORDERED: Continue CPAP Smoking cessation Albuterol  as needed Continue Chantix  Enroll in lung cancer screening program next year Follow-up CT chest Avoid Allergens and Irritants Avoid secondhand smoke Avoid SICK contacts Recommend  Masking  when appropriate Recommend Keep up-to-date with vaccinations    CURRENT MEDICATIONS REVIEWED AT LENGTH WITH PATIENT TODAY   Patient  satisfied with  Plan of action and management. All questions answered   Follow up 1 year   I spent a total of 48 minutes dedicated to the care of this patient on the date of this encounter to include pre-visit review of records, face-to-face time with the patient discussing conditions above, post visit ordering of testing, clinical documentation with the electronic health record, making appropriate referrals as documented, and communicating necessary information to the patient's healthcare team.    The Patient requires high complexity decision making for assessment and support, frequent evaluation and titration of therapies, application of advanced monitoring technologies and extensive interpretation of multiple databases.  Patient satisfied with Plan of action and management. All questions  answered    Nickolas Alm Cellar, M.D.  [facility]  Medical Director [facility] Medical Director [facility] Cardio-Pulmonary Department

## 2024-06-25 NOTE — Patient Instructions (Addendum)
 PLEASE STOP SMOKING!!  Your breathing tests are Normal  Excellent Job A+ GOLD STAR!!  Continue CPAP as prescribed  Patient Instructions Continue to use CPAP every night, minimum of 4-6 hours a night.  Change equipment every 30 days or as directed by DME.  Wash your tubing with warm soap and water daily, hang to dry. Wash humidifier portion weekly. Use bottled, distilled water and change daily   Be aware of reduced alertness and do not drive or operate heavy machinery if experiencing this or drowsiness.  Exercise encouraged, as tolerated. Encouraged proper weight management.  Important to get eight or more hours of sleep  Limiting the use of the computer and television before bedtime.  Decrease naps during the day, so night time sleep will become enhanced.  Limit caffeine , and sleep deprivation.    Avoid Allergens and Irritants Avoid secondhand smoke Avoid SICK contacts Recommend  Masking  when appropriate Recommend Keep up-to-date with vaccinations Use albuterol  as needed

## 2024-06-25 NOTE — Progress Notes (Signed)
 Full PFT performed without DLCO.

## 2024-06-25 NOTE — Progress Notes (Signed)
 Name: Nichole Gonzalez MRN: [id number] DOB: Mar 11, 1976    CHIEF COMPLAINT:  Assessment of OSA Assessment of COPD Tobacco abuse   HISTORY OF PRESENT ILLNESS: Patient is seen today for problems and issues with sleep related to excessive daytime sleepiness Patient  has been having sleep problems for many years Patient has been having excessive daytime sleepiness for a long time Patient has been having extreme fatigue and tiredness, lack of energy  Discussed sleep data and reviewed with patient.  Encouraged proper weight management.  Discussed driving precautions and its relationship with hypersomnolence.  Discussed operating dangerous equipment and its relationship with hypersomnolence.   2024 HST shows AHI of 18.2 Patient did not start CPAP therapy until recently Patient started 2 weeks ago Patient uses and benefits from therapy Using CPAP nightly and with naps Pressure setting is comfortable and is sleeping well. Uses fullface mask Auto CPAP 5-20 AHI reduced to 2.7 100% compliance over the last 2 weeks greater than 6 hours of usage   Assessment of COPD Patient has chronic intermittent shortness of breath and dyspnea on exertion More than 2025 patient had acute bronchitis was given prednisone  and antibiotics which helped her symptoms Patient underwent chest x-ray and CT scan which showed multiple subcentimeter nodules Due to her smoking history we will need to follow-up CT chest Patient continues to take albuterol  as needed No maintenance therapy at this time  No exacerbation at this time No evidence of heart failure at this time No evidence or signs of infection at this time No respiratory distress No fevers, chills, nausea, vomiting, diarrhea No evidence of lower extremity edema No evidence hemoptysis  Smoking history 1 pack a day for the last 20 years Patient currently on Chantix  and is willing to stop smoking I told her we need to obtain pulmonary function  testing to assess lung function with probable underlying COPD   CT chest March 2025 independently reviewed by me today Right middle lobe opacification nodular opacity Follow-up CT chest in 1 month recommended  PAST MEDICAL HISTORY :   has a past medical history of Abnormal thyroid  stimulating hormone (TSH) level (02/17/2015), Abnormal uterine bleeding (10/07/2020), Allergic rhinitis (09/03/2015), Depression, recurrent (HCC) (09/08/2020), Gastroesophageal reflux disease (10/27/2020), Heart murmur (1988), High risk sexual behavior (11/06/2020), 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  has a past surgical history that includes Knee arthroscopy; Tubal ligation; right foot; Joint replacement; Abdominal hysterectomy; Appendectomy; Cholecystectomy; and Dilation and curettage, diagnostic / therapeutic. Prior to Admission medications   Medication Sig Start Date End Date Taking? Authorizing Provider  albuterol  (VENTOLIN  HFA) 108 (90 Base) MCG/ACT inhaler Inhale 2 puffs into the lungs every 6 (six) hours as needed for wheezing or shortness of breath. 04/08/24   Milon Cleaves, PA  Ascorbic Acid (VITAMIN C PO) Take by mouth.    [provider]  atorvastatin  (LIPITOR) 20 MG tablet Take 1 tablet (20 mg total) by mouth daily. 01/07/24   Milon Cleaves, PA  buPROPion  (WELLBUTRIN  XL) 150 MG 24 hr tablet Take 3 tablets (450 mg total) by mouth daily. 03/12/24   Milon Cleaves, PA  celecoxib  (CELEBREX ) 100 MG capsule Take 1 capsule (100 mg total) by mouth 2 (two) times daily. 07/22/23   Monetta Redell PARAS, MD  cetirizine  (ZYRTEC  ALLERGY) 10 MG tablet Take 1 tablet (10 mg total) by mouth daily. 05/21/23   CoxAbigail, MD  cevimeline  (EVOXAC ) 30 MG capsule Take 1 capsule (30 mg total) by mouth 3 (three)  times daily. 01/07/24   Milon Cleaves, PA  citalopram  (CELEXA ) 40 MG tablet Take 1 tablet (40 mg total) by mouth daily. 12/12/23   CoxAbigail,  MD  cyclobenzaprine  (FLEXERIL ) 10 MG tablet Take 1 tablet (10 mg total) by mouth at bedtime. 02/04/24   Milon Cleaves, PA  EPIPEN 2-PAK 0.3 MG/0.3ML SOAJ injection  05/16/15   [provider]  fluticasone  (FLONASE ) 50 MCG/ACT nasal spray Place 1 spray into both nostrils once daily. 10/04/23   CoxAbigail, MD  lisdexamfetamine (VYVANSE ) 40 MG capsule Take 1 capsule (40 mg total) by mouth every morning. 05/07/24   Milon Cleaves, PA  LORazepam  (ATIVAN ) 0.5 MG tablet Take 1 tablet (0.5 mg total) by mouth 2 (two) times daily as needed (panic attacks). 05/21/23   CoxAbigail, MD  omeprazole  (PRILOSEC) 40 MG capsule Take 1 capsule (40 mg total) by mouth daily. 03/12/24   Milon Cleaves, PA  potassium chloride  (KLOR-CON ) 10 MEQ tablet Take 1 tablet (10 mEq total) by mouth daily. 03/20/24   Monetta Redell PARAS, MD  sodium fluoride  (FLUORISHIELD) 1.1 % GEL dental gel Place 1 Application onto teeth at bedtime. 03/19/23   [provider]  topiramate  (TOPAMAX ) 100 MG tablet Take 1 tablet (100 mg total) by mouth daily. 02/19/24   Milon Cleaves, PA  traZODone  (DESYREL ) 100 MG tablet Take 1 tablet (100 mg total) by mouth at bedtime. 12/12/23   CoxAbigail, MD  varenicline  (CHANTIX ) 0.5 MG tablet Take 1 tablet (0.5 mg total) by mouth 2 (two) times daily. 05/07/24   Milon Cleaves, PA   Allergies  Allergen Reactions   Morphine Anaphylaxis   Morphine And Codeine Anaphylaxis   Bee Pollen Other (See Comments)    Anaphylaxis.   Codeine Other (See Comments)    Fainting.   Rosuvastatin  Other (See Comments)    GI upset    FAMILY HISTORY:  family history includes COPD in her sister; Coronary artery disease in her father; Diabetes Mellitus II in her sister; Heart attack in her father; Heart disease in her father; Thyroid  disease in her sister. SOCIAL HISTORY:  reports that she has been smoking cigarettes. She started smoking about 8 months ago. She has a 30.3 pack-year smoking history. She has never been exposed  to tobacco smoke. She has never used smokeless tobacco. She reports current alcohol use of about 8.0 standard drinks of alcohol per week. She reports that she does not use drugs.   LMP 01/04/2016 (Approximate)      Review of Systems: Gen:  Denies  fever, sweats, chills weight loss  HEENT: Denies blurred vision, double vision, ear pain, eye pain, hearing loss, nose bleeds, sore throat Cardiac:  No dizziness, chest pain or heaviness, chest tightness,edema, No JVD Resp:   No cough, -sputum production, +shortness of breath,-wheezing, -hemoptysis,  Other:  All other systems negative   Physical Examination:   General Appearance: No distress  EYES PERRLA, EOM intact.   NECK Supple, No JVD Pulmonary: normal breath sounds, No wheezing.  CardiovascularNormal S1,S2.  No m/r/g.   Abdomen: Benign, Soft, non-tender. Neurology UE/LE 5/5 strength, no focal deficits Ext pulses intact, cap refill intact ALL OTHER ROS ARE NEGATIVE     ASSESSMENT AND PLAN SYNOPSIS 48 year old pleasant white female seen today for Patient with signs and symptoms of excessive daytime sleepiness with  underlying diagnosis of obstructive sleep apnea in the setting of obesity and deconditioned state, with ongoing tobacco abuse probable underlying COPD with a recent COPD exacerbation found to have  abnormal CT chest with multiple subcentimeter pulmonary nodules   Assessment of OSA Previous AHI 18.2 Continue CPAP as prescribed  Excellent compliance report Reviewed compliance report in detail with patient Patient definitely benefits the use of CPAP therapy as prescribed Using CPAP nightly and with naps Pressure setting is comfortable and is sleeping well. CPAP prescription Auto 5-20 fullface mask AHI reduced to 2.7  No evidence of acute heart failure at this time No respiratory distress No fevers, chills, nausea, vomiting, diarrhea No evidence hemoptysis  Patient Instructions Continue to use CPAP every night,  minimum of 4-6 hours a night.  Change equipment every 30 days or as directed by DME.  Wash your tubing with warm soap and water daily, hang to dry. Wash humidifier portion weekly. Use bottled, distilled water and change daily   Be aware of reduced alertness and do not drive or operate heavy machinery if experiencing this or drowsiness.  Exercise encouraged, as tolerated. Encouraged proper weight management.  Important to get eight or more hours of sleep  Limiting the use of the computer and television before bedtime.  Decrease naps during the day, so night time sleep will become enhanced.  Limit caffeine , and sleep deprivation.  HTN, stroke, uncontrolled diabetes and heart failure are potential risk factors.  Risk of untreated sleep apnea including cardiac arrhthymias, stroke, DM, pulm HTN.    Obesity -recommend significant weight loss -recommend changing diet  Deconditioned state -Recommend increased daily activity and exercise  Assessment of COPD Recommend pulmonary function testing No maintenance therapy at this time Continue albuterol  as needed Avoid Allergens and Irritants Avoid secondhand smoke Avoid SICK contacts Recommend  Masking  when appropriate Recommend Keep up-to-date with vaccinations   Smoking Assessment and Cessation Counseling Upon further questioning, Patient smokes 1/2 ppd I have advised patient to quit/stop smoking as soon as possible due to high risk for multiple medical problems Patient is willing to quit smoking  I have advised patient that we can assist and have options of Nicotine replacement therapy. I also advised patient on behavioral therapy and can provide oral medication therapy in conjunction with the other therapies Follow up next Office visit  for assessment of smoking cessation Smoking cessation counseling advised for >10 minutes Patient currently on Chantix  therapy  Abnormal CT chest with subcentimeter nodules Obtain CT chest in 1 month  to assess interval changes     MEDICATION ADJUSTMENTS/LABS AND TESTS ORDERED: Continue CPAP Obtain pulmonary function test Smoking cessation As needed albuterol  Continue Chantix  CT chest 1 month   CURRENT MEDICATIONS REVIEWED AT LENGTH WITH PATIENT TODAY   Patient  satisfied with Plan of action and management. All questions answered  Follow up  4 weeks    I spent a total of 97 minutes reviewing chart data, face-to-face evaluation with the patient, counseling and coordination of care as detailed above.    Nickolas Alm Cellar, M.D.  [facility]  Medical Director [facility] Medical Director [facility] Cardio-Pulmonary Department

## 2024-06-28 DIAGNOSIS — G4733 Obstructive sleep apnea (adult) (pediatric): Secondary | ICD-10-CM | POA: Diagnosis not present

## 2024-06-29 DIAGNOSIS — G4733 Obstructive sleep apnea (adult) (pediatric): Secondary | ICD-10-CM | POA: Diagnosis not present

## 2024-07-06 ENCOUNTER — Encounter: Payer: Self-pay | Admitting: Physician Assistant

## 2024-07-09 ENCOUNTER — Other Ambulatory Visit: Payer: Self-pay

## 2024-07-09 ENCOUNTER — Other Ambulatory Visit: Payer: Self-pay | Admitting: Physician Assistant

## 2024-07-09 DIAGNOSIS — F17209 Nicotine dependence, unspecified, with unspecified nicotine-induced disorders: Secondary | ICD-10-CM

## 2024-07-09 MED ORDER — LISDEXAMFETAMINE DIMESYLATE 60 MG PO CAPS
60.0000 mg | ORAL_CAPSULE | ORAL | 0 refills | Status: DC
  Filled 2024-07-09: qty 30, 30d supply, fill #0

## 2024-07-09 MED ORDER — VARENICLINE TARTRATE 0.5 MG PO TABS
0.5000 mg | ORAL_TABLET | Freq: Two times a day (BID) | ORAL | 3 refills | Status: DC
  Filled 2024-07-09: qty 56, 28d supply, fill #0
  Filled 2024-08-05: qty 56, 28d supply, fill #1
  Filled 2024-09-05: qty 56, 28d supply, fill #2
  Filled 2024-10-03 – 2024-10-19 (×2): qty 56, 28d supply, fill #3

## 2024-07-13 ENCOUNTER — Other Ambulatory Visit: Payer: Self-pay | Admitting: Physician Assistant

## 2024-07-13 DIAGNOSIS — M5440 Lumbago with sciatica, unspecified side: Secondary | ICD-10-CM

## 2024-07-13 DIAGNOSIS — S39012S Strain of muscle, fascia and tendon of lower back, sequela: Secondary | ICD-10-CM

## 2024-07-13 DIAGNOSIS — M542 Cervicalgia: Secondary | ICD-10-CM

## 2024-07-16 ENCOUNTER — Ambulatory Visit (INDEPENDENT_AMBULATORY_CARE_PROVIDER_SITE_OTHER): Admitting: Physician Assistant

## 2024-07-16 ENCOUNTER — Other Ambulatory Visit: Payer: Self-pay

## 2024-07-16 VITALS — BP 120/72 | HR 88 | Temp 98.6°F | Resp 16 | Ht 66.0 in | Wt 225.6 lb

## 2024-07-16 DIAGNOSIS — R918 Other nonspecific abnormal finding of lung field: Secondary | ICD-10-CM

## 2024-07-16 DIAGNOSIS — K2100 Gastro-esophageal reflux disease with esophagitis, without bleeding: Secondary | ICD-10-CM | POA: Diagnosis not present

## 2024-07-16 DIAGNOSIS — R198 Other specified symptoms and signs involving the digestive system and abdomen: Secondary | ICD-10-CM | POA: Insufficient documentation

## 2024-07-16 DIAGNOSIS — G43009 Migraine without aura, not intractable, without status migrainosus: Secondary | ICD-10-CM

## 2024-07-16 DIAGNOSIS — M5440 Lumbago with sciatica, unspecified side: Secondary | ICD-10-CM

## 2024-07-16 DIAGNOSIS — Z23 Encounter for immunization: Secondary | ICD-10-CM | POA: Diagnosis not present

## 2024-07-16 DIAGNOSIS — F17209 Nicotine dependence, unspecified, with unspecified nicotine-induced disorders: Secondary | ICD-10-CM | POA: Diagnosis not present

## 2024-07-16 DIAGNOSIS — I739 Peripheral vascular disease, unspecified: Secondary | ICD-10-CM | POA: Diagnosis not present

## 2024-07-16 DIAGNOSIS — Z0000 Encounter for general adult medical examination without abnormal findings: Secondary | ICD-10-CM | POA: Insufficient documentation

## 2024-07-16 MED ORDER — SUCRALFATE 1 G PO TABS
1.0000 g | ORAL_TABLET | Freq: Two times a day (BID) | ORAL | 0 refills | Status: DC
  Filled 2024-07-16: qty 60, 30d supply, fill #0

## 2024-07-16 NOTE — Progress Notes (Signed)
 Subjective:  Patient ID: Nichole Gonzalez, female    DOB: 12/04/1976  Age: 48 y.o. MRN: [id number]  Chief Complaint  Patient presents with   Annual Exam    HPI:  Discussed the use of AI scribe software for clinical note transcription with the patient, who gave verbal consent to proceed.  History of Present Illness   Nichole Gonzalez is a 48 year old female who presents with severe back pain and recent episodes of bleeding from the umbilicus.  She experiences severe back pain that has progressed to the point where she is unable to perform daily activities such as sweeping the floor. The pain is described as sharp and feels as if 'somebody's trying to break it.' Relief is noted when leaning forward, which she achieves by leaning on counters or pushing a shopping cart. No numbness or tingling in the legs, but her legs get shaky.  She describes an episode of bleeding from her umbilicus that occurred last week, noticing blood around the area upon waking and preparing for a shower. No pain, cuts, or bumps were present at the time of bleeding. She recalls a previous episode a couple of months ago when the area was sore and had a foul smell, which she managed by cleaning with a Q-tip.  She reports swelling in her right ankle that occurred yesterday, which left an indentation when pressed and persisted until she went to bed. No significant pain beyond her usual knee pain, which radiates and worsens with stepping up or down. She experiences tightness and a dull ache in her legs when walking long distances, attributing it to her history of being athletic.  She has a history of nodules in her lungs, which are being monitored. She is currently taking several medications, including trazodone , Chantix , and a medication for dry mouth. She takes omeprazole  daily for acid reflux and reports that missing doses leads to severe stomach pain, described as a burning sensation in the upper abdomen. She has a  history of an EGD in 2021, which included esophageal dilation.  She has a history of smoking but reports a significant reduction in smoking with the help of Chantix . She does not consume alcohol and has had a hysterectomy. She experiences headaches that start in the morning and persist throughout the day, often exacerbated by neck tightness and computer use.          07/16/2024    8:03 AM 05/20/2024    7:29 AM 04/08/2024    7:41 AM 01/07/2024    8:53 AM 10/04/2023    8:35 AM  Depression screen PHQ 2/9  Decreased Interest 0 0 0 0 1  Down, Depressed, Hopeless 0 0 0 0 0  PHQ - 2 Score 0 0 0 0 1  Altered sleeping   0 0 0  Tired, decreased energy   3 2 1   Change in appetite   0 3 1  Feeling bad or failure about yourself    0 0 0  Trouble concentrating   0 0 0  Moving slowly or fidgety/restless   0 0 0  Suicidal thoughts   0 0 0  PHQ-9 Score   3 5 3   Difficult doing work/chores   Somewhat difficult Somewhat difficult Not difficult at all        07/16/2024    8:03 AM  Fall Risk   Falls in the past year? 0  Number falls in past yr: 0  Injury with Fall? 0  Risk  for fall due to : No Fall Risks  Follow up Falls evaluation completed    Patient Care Team: Milon Cleaves, [place] as PCP - General (Physician Assistant)   Review of Systems  Performed and documented in physical exam Current Outpatient Medications on File Prior to Visit  Medication Sig Dispense Refill   albuterol  (VENTOLIN  HFA) 108 (90 Base) MCG/ACT inhaler Inhale 2 puffs into the lungs every 6 (six) hours as needed for wheezing or shortness of breath. 6.7 g 2   atorvastatin  (LIPITOR) 20 MG tablet Take 1 tablet (20 mg total) by mouth daily. 90 tablet 3   buPROPion  (WELLBUTRIN  XL) 150 MG 24 hr tablet Take 3 tablets (450 mg total) by mouth daily. 270 tablet 3   celecoxib  (CELEBREX ) 100 MG capsule Take 1 capsule (100 mg total) by mouth 2 (two) times daily. 20 capsule 0   cetirizine  (ZYRTEC  ALLERGY) 10 MG tablet Take 1 tablet (10 mg  total) by mouth daily. 90 tablet 3   cevimeline  (EVOXAC ) 30 MG capsule Take 1 capsule (30 mg total) by mouth 3 (three) times daily. 90 capsule 3   citalopram  (CELEXA ) 40 MG tablet Take 1 tablet (40 mg total) by mouth daily. 90 tablet 1   cyclobenzaprine  (FLEXERIL ) 10 MG tablet Take 1 tablet (10 mg total) by mouth at bedtime. 90 tablet 1   dicyclomine  (BENTYL ) 20 MG tablet Take 1 tablet (20 mg total) by mouth every 6 (six) hours. 360 tablet 3   EPIPEN 2-PAK 0.3 MG/0.3ML SOAJ injection   0   fluticasone  (FLONASE ) 50 MCG/ACT nasal spray Place 1 spray into both nostrils once daily. 48 g 3   lisdexamfetamine (VYVANSE ) 60 MG capsule Take 1 capsule (60 mg total) by mouth every morning. 30 capsule 0   LORazepam  (ATIVAN ) 0.5 MG tablet Take 1 tablet (0.5 mg total) by mouth 2 (two) times daily as needed (panic attacks). 30 tablet 1   omeprazole  (PRILOSEC) 40 MG capsule Take 1 capsule (40 mg total) by mouth daily. 90 capsule 3   potassium chloride  (KLOR-CON ) 10 MEQ tablet Take 1 tablet (10 mEq total) by mouth daily. 90 tablet 1   sodium fluoride  (FLUORISHIELD) 1.1 % GEL dental gel Place 1 Application onto teeth at bedtime.     topiramate  (TOPAMAX ) 100 MG tablet Take 1 tablet (100 mg total) by mouth daily. 90 tablet 0   traZODone  (DESYREL ) 100 MG tablet Take 1 tablet (100 mg total) by mouth at bedtime. 90 tablet 0   varenicline  (CHANTIX ) 0.5 MG tablet Take 1 tablet (0.5 mg total) by mouth 2 (two) times daily. 56 tablet 3   No current facility-administered medications on file prior to visit.   Past Medical History:  Diagnosis Date   Abnormal thyroid  stimulating hormone (TSH) level 02/17/2015   Abnormal uterine bleeding 10/07/2020   Allergic rhinitis 09/03/2015   Depression, recurrent (HCC) 09/08/2020   Gastroesophageal reflux disease 10/27/2020   Heart murmur 1988   I was young   High risk sexual behavior 11/06/2020   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   Hyperlipidemia 2023   Recently   Primary insomnia  09/08/2020   Tobacco abuse 09/03/2015   Past Surgical History:  Procedure Laterality Date   ABDOMINAL HYSTERECTOMY     APPENDECTOMY     CHOLECYSTECTOMY     DILATION AND CURETTAGE, DIAGNOSTIC / THERAPEUTIC     JOINT REPLACEMENT     KNEE ARTHROSCOPY     right foot     TUBAL LIGATION  Family History  Problem Relation Age of Onset   Thyroid  disease Sister    Coronary artery disease Father    Heart attack Father        27 he dies at age 12. His heart skipped a beat and didnt start back.   Heart disease Father        I know he had heart disease, not sure if it was a heart attach.   COPD Sister    Diabetes Mellitus II Sister    Social History   Socioeconomic History   Marital status: Married    Spouse name: Not on file   Number of children: 3   Years of education: Not on file   Highest education level: Bachelor's degree (e.g., BA, AB, BS)  Occupational History   Not on file  Tobacco Use   Smoking status: Every Day    Current packs/day: 0.25    Average packs/day: 1 pack/day for 30.7 years (30.3 ttl pk-yrs)    Types: Cigarettes    Start date: 10/25/2023    Passive exposure: Never   Smokeless tobacco: Never  Vaping Use   Vaping status: Never Used  Substance and Sexual Activity   Alcohol use: Yes    Alcohol/week: 8.0 standard drinks of alcohol    Types: 8 Standard drinks or equivalent per week    Comment: socially   Drug use: Never   Sexual activity: Not on file  Other Topics Concern   Not on file  Social History Narrative   ** Merged History Encounter **       Social Drivers of Health   Financial Resource Strain: Low Risk  (07/16/2024)   Overall Financial Resource Strain (CARDIA)    Difficulty of Paying Living Expenses: Not very hard  Food Insecurity: No Food Insecurity (07/16/2024)   Hunger Vital Sign    Worried About Running Out of Food in the Last Year: Never true    Ran Out of Food in the Last Year: Never true  Transportation Needs: No Transportation  Needs (07/16/2024)   PRAPARE - Administrator, Civil Service (Medical): No    Lack of Transportation (Non-Medical): No  Physical Activity: Inactive (07/16/2024)   Exercise Vital Sign    Days of Exercise per Week: 0 days    Minutes of Exercise per Session: Not on file  Stress: Stress Concern Present (07/16/2024)   [employer] of Occupational Health - Occupational Stress Questionnaire    Feeling of Stress: To some extent  Social Connections: Moderately Isolated (07/16/2024)   Social Connection and Isolation Panel    Frequency of Communication with Friends and Family: More than three times a week    Frequency of Social Gatherings with Friends and Family: Once a week    Attends Religious Services: Never    Diplomatic Services operational officer: No    Attends Engineer, structural: Not on file    Marital Status: Married    Objective:  BP 120/72   Pulse 88   Temp 98.6 F (37 C)   Resp 16   Ht 5' 6 (1.676 m)   Wt 225 lb 9.6 oz (102.3 kg)   LMP 01/04/2016 (Approximate)   SpO2 100%   BMI 36.41 kg/m      07/16/2024    8:04 AM 06/25/2024    9:19 AM 06/25/2024    8:35 AM  BP/Weight  Systolic BP 120 110   Diastolic BP 72 70   Wt. (Lbs) 225.6 225.6  225.6  BMI 36.41 kg/m2 36.41 kg/m2 36.41 kg/m2    Physical Exam Performed and documented in physical exam     Lab Results  Component Value Date   WBC 9.9 05/05/2024   HGB 14.1 05/05/2024   HCT 41.8 05/05/2024   PLT 249 05/05/2024   GLUCOSE 102 (H) 05/05/2024   CHOL 177 04/08/2024   TRIG 157 (H) 04/08/2024   HDL 42 04/08/2024   LDLCALC 107 (H) 04/08/2024   ALT 20 05/05/2024   AST 18 05/05/2024   NA 143 05/05/2024   K 3.9 05/05/2024   CL 107 05/05/2024   CREATININE 0.88 05/05/2024   BUN 8 05/05/2024   CO2 24 05/05/2024   TSH 0.688 01/03/2024   HGBA1C 5.5 01/03/2024      Assessment & Plan:  Physical exam, annual  Gastroesophageal reflux disease with esophagitis without hemorrhage Assessment  & Plan: Intermittent epigastric pain possibly related to a gastric ulcer. Severe pain when omeprazole  is missed, suggestive of acid-related pathology. Differential includes esophagitis and H. pylori infection. Discussed the use of Voquezna and Carafate  for management. - Prescribe Voquezna for inflammation and ulcer management. - Prescribe Carafate  to coat the stomach lining and aid healing. - Monitor response to treatment and adjust as necessary.  Orders: -     Sucralfate ; Take 1 tablet (1 g total) by mouth 2 (two) times daily.  Dispense: 60 tablet; Refill: 0  Intermittent claudication (HCC) Assessment & Plan: Intermittent leg pain and swelling, particularly in the right ankle, suggestive of claudication. Possible arterial plaque buildup. Discussed the need for an ankle-brachial index and ultrasound to assess blood flow and potential interventions. - Order ankle-brachial index and ultrasound at cardiologist's office to assess blood flow.  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 Possible nerve compression due to arthritis around spinal openings. Discussed potential for imaging to assess for worsening stenosis or spondylolisthesis. - Obtain previous x-rays from chiropractor for comparison. - Attend scheduled neck and back appointments. - Consider imaging for further evaluation if symptoms persist.   Migraine without aura and without status migrainosus, not intractable Assessment & Plan: Daily headaches likely related to neck tightness and computer use. Symptoms include pain radiating from the neck to the head, affecting vision and concentration. -  Advise maintaining hydration to prevent headaches. - Monitor symptoms and consider further evaluation if headaches persist.   Umbilical bleeding Assessment & Plan: Recent episode of blood from the umbilicus without pain or redness. Possible residual infection causing bleeding. No further bleeding reported since the initial episode. - Monitor for any changes or recurrence of bleeding. - Consider abdominal imaging if bleeding recurs.   Multiple lung nodules on CT Assessment & Plan: Pulmonary nodules with no significant changes. Previous opacities have resolved. - Schedule follow-up CT scan in one year.   Tobacco use disorder, continuous Assessment & Plan: Significant reduction in smoking with the aid of Chantix . She reports minimal smoking and no interest in alcohol. - Continue Chantix  as prescribed. - Monitor smoking habits and provide support as needed.     General Health Maintenance Coordination with hematology for lab tests to avoid multiple blood draws. Discussed the option to plan the  physical exam for next year around the same time to align with insurance benefits. - Plan physical exam for next year around the same time. - Coordinate lab work with hematology appointment in November.      Meds ordered this encounter  Medications   sucralfate  (CARAFATE ) 1 g tablet    Sig: Take 1 tablet (1 g total) by mouth 2 (two) times daily.    Dispense:  60 tablet    Refill:  0    Orders Placed This Encounter  Procedures   US  Lower Ext Art Bilat   Heplisav-B  (HepB-CPG) Vaccine   Pneumococcal polysaccharide vaccine 23-valent greater than or equal to 2yo subcutaneous/IM     Follow-up: Return in about 4 months (around 11/16/2024) for cpe, Nola.   I,Scotty Weigelt,acting as a Neurosurgeon for [employer], PA.,have documented all relevant documentation on the behalf of Nola Angles, PA,as directed by  Nola Angles, PA while in the presence of Nola Angles, [place].   An After Visit Summary was  printed and given to the patient.  Nola Angles, [place] [facility] [phone]

## 2024-07-16 NOTE — Assessment & Plan Note (Signed)
 Significant reduction in smoking with the aid of Chantix . She reports minimal smoking and no interest in alcohol. - Continue Chantix  as prescribed. - Monitor smoking habits and provide support as needed.

## 2024-07-16 NOTE — Assessment & Plan Note (Signed)
 Chronic back pain with sharp pains and difficulty walking, suggestive of vertebral stenosis. Possible nerve compression due to arthritis around spinal openings. Discussed potential for imaging to assess for worsening stenosis or spondylolisthesis. - Obtain previous x-rays from chiropractor for comparison. - Attend scheduled neck and back appointments. - Consider imaging for further evaluation if symptoms persist.

## 2024-07-16 NOTE — Assessment & Plan Note (Signed)
 Daily headaches likely related to neck tightness and computer use. Symptoms include pain radiating from the neck to the head, affecting vision and concentration. - Advise maintaining hydration to prevent headaches. - Monitor symptoms and consider further evaluation if headaches persist.

## 2024-07-16 NOTE — Assessment & Plan Note (Signed)
 Intermittent epigastric pain possibly related to a gastric ulcer. Severe pain when omeprazole  is missed, suggestive of acid-related pathology. Differential includes esophagitis and H. pylori infection. Discussed the use of Voquezna and Carafate  for management. - Prescribe Voquezna for inflammation and ulcer management. - Prescribe Carafate  to coat the stomach lining and aid healing. - Monitor response to treatment and adjust as necessary.

## 2024-07-16 NOTE — Assessment & Plan Note (Signed)
 Intermittent leg pain and swelling, particularly in the right ankle, suggestive of claudication. Possible arterial plaque buildup. Discussed the need for an ankle-brachial index and ultrasound to assess blood flow and potential interventions. - Order ankle-brachial index and ultrasound at cardiologist's office to assess blood flow.

## 2024-07-16 NOTE — Assessment & Plan Note (Signed)
 Pulmonary nodules with no significant changes. Previous opacities have resolved. - Schedule follow-up CT scan in one year.

## 2024-07-16 NOTE — Assessment & Plan Note (Signed)
 Recent episode of blood from the umbilicus without pain or redness. Possible residual infection causing bleeding. No further bleeding reported since the initial episode. - Monitor for any changes or recurrence of bleeding. - Consider abdominal imaging if bleeding recurs.

## 2024-07-16 NOTE — Progress Notes (Signed)
 Subjective:  Patient ID: Nichole Gonzalez, female    DOB: 06/08/1976  Age: 48 y.o. MRN: [id number]  Chief Complaint  Patient presents with   Annual Exam    Well Adult Physical: Patient here for a comprehensive physical exam.The patient reports problems - Belly button sore and bloody. Do you take any herbs or supplements that were not prescribed by a doctor? no Are you taking calcium  supplements? no Are you taking aspirin  daily? no  Encounter for general adult medical examination without abnormal findings  Physical (At Risk items are starred): Patient's last physical exam was 1 year ago .  Patient is not afflicted from Stress Incontinence and Urge Incontinence  Patient wears a seat belts Patient has smoke detectors and has carbon monoxide detectors. Patient practices appropriate gun safety. Patient wears sunscreen with extended sun exposure. Dental Care: biannual cleanings, brushes and flosses daily. Ophthalmology/Optometry: Annual visit.  Hearing loss: none Vision impairments: none  Menarche: 10 Menstrual History: abnormal LMP: Partial Hysterectomy, LMP 2014 Pregnancy history: 4 Safe at home: YES Self breast exams: YES     07/16/2024    8:03 AM 05/20/2024    7:29 AM 04/08/2024    7:41 AM 01/07/2024    8:53 AM 10/04/2023    8:35 AM  Depression screen PHQ 2/9  Decreased Interest 0 0 0 0 1  Down, Depressed, Hopeless 0 0 0 0 0  PHQ - 2 Score 0 0 0 0 1  Altered sleeping   0 0 0  Tired, decreased energy   3 2 1   Change in appetite   0 3 1  Feeling bad or failure about yourself    0 0 0  Trouble concentrating   0 0 0  Moving slowly or fidgety/restless   0 0 0  Suicidal thoughts   0 0 0  PHQ-9 Score   3 5 3   Difficult doing work/chores   Somewhat difficult Somewhat difficult Not difficult at all         10/04/2023    8:35 AM 01/07/2024    8:53 AM 04/08/2024    7:41 AM 05/20/2024    7:29 AM 07/16/2024    8:03 AM  Fall Risk  Falls in the past year? 0 0 1 0 0  Was  there an injury with Fall? 0 0 1 0 0  Fall Risk Category Calculator 0 0 2 0 0  Patient at Risk for Falls Due to No Fall Risks No Fall Risks No Fall Risks No Fall Risks No Fall Risks  Fall risk Follow up Falls evaluation completed Falls evaluation completed Falls evaluation completed  Falls evaluation completed             Social Hx   Social History   Socioeconomic History   Marital status: Married    Spouse name: Not on file   Number of children: 3   Years of education: Not on file   Highest education level: Bachelor's degree (e.g., BA, AB, BS)  Occupational History   Not on file  Tobacco Use   Smoking status: Every Day    Current packs/day: 0.25    Average packs/day: 1 pack/day for 30.7 years (30.3 ttl pk-yrs)    Types: Cigarettes    Start date: 10/25/2023    Passive exposure: Never   Smokeless tobacco: Never  Vaping Use   Vaping status: Never Used  Substance and Sexual Activity   Alcohol use: Yes    Alcohol/week: 8.0 standard drinks of alcohol  Types: 8 Standard drinks or equivalent per week    Comment: socially   Drug use: Never   Sexual activity: Not on file  Other Topics Concern   Not on file  Social History Narrative   ** Merged History Encounter **       Social Drivers of Health   Financial Resource Strain: Low Risk  (07/16/2024)   Overall Financial Resource Strain (CARDIA)    Difficulty of Paying Living Expenses: Not very hard  Food Insecurity: No Food Insecurity (07/16/2024)   Hunger Vital Sign    Worried About Running Out of Food in the Last Year: Never true    Ran Out of Food in the Last Year: Never true  Transportation Needs: No Transportation Needs (07/16/2024)   PRAPARE - Administrator, Civil Service (Medical): No    Lack of Transportation (Non-Medical): No  Physical Activity: Inactive (07/16/2024)   Exercise Vital Sign    Days of Exercise per Week: 0 days    Minutes of Exercise per Session: Not on file  Stress: Stress Concern  Present (07/16/2024)   [employer] of Occupational Health - Occupational Stress Questionnaire    Feeling of Stress: To some extent  Social Connections: Moderately Isolated (07/16/2024)   Social Connection and Isolation Panel    Frequency of Communication with Friends and Family: More than three times a week    Frequency of Social Gatherings with Friends and Family: Once a week    Attends Religious Services: Never    Database administrator or Organizations: No    Attends Engineer, structural: Not on file    Marital Status: Married   Past Medical History:  Diagnosis Date   Abnormal thyroid  stimulating hormone (TSH) level 02/17/2015   Abnormal uterine bleeding 10/07/2020   Allergic rhinitis 09/03/2015   Depression, recurrent (HCC) 09/08/2020   Gastroesophageal reflux disease 10/27/2020   Heart murmur 1988   I was young   High risk sexual behavior 11/06/2020   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   Hyperlipidemia 2023   Recently   Primary insomnia 09/08/2020   Tobacco abuse 09/03/2015   Past Surgical History:  Procedure Laterality Date   ABDOMINAL HYSTERECTOMY     APPENDECTOMY     CHOLECYSTECTOMY     DILATION AND CURETTAGE, DIAGNOSTIC / THERAPEUTIC     JOINT REPLACEMENT     KNEE ARTHROSCOPY     right foot     TUBAL LIGATION      Family History  Problem Relation Age of Onset   Thyroid  disease Sister    Coronary artery disease Father    Heart attack Father        64 he dies at age 76. His heart skipped a beat and didnt start back.   Heart disease Father        I know he had heart disease, not sure if it was a heart attach.   COPD Sister    Diabetes Mellitus II Sister     Review of Systems  Constitutional:  Negative for chills, diaphoresis, fatigue and fever.  HENT:  Negative for congestion, ear pain and sinus pain.   Eyes: Negative.   Respiratory:  Negative for cough and shortness of breath.   Cardiovascular:  Negative for chest pain and palpitations.   Gastrointestinal:  Positive for abdominal pain, constipation, diarrhea and nausea. Negative for vomiting.  Endocrine: Negative.   Genitourinary:  Negative for dysuria, frequency and urgency.  Musculoskeletal:  Negative for arthralgias.  Skin:  Negative.   Allergic/Immunologic: Negative.   Neurological:  Negative for weakness and headaches.  Psychiatric/Behavioral:  Negative for dysphoric mood. The patient is not nervous/anxious.      Objective:  BP 120/72   Pulse 88   Temp 98.6 F (37 C)   Resp 16   Ht 5' 6 (1.676 m)   Wt 225 lb 9.6 oz (102.3 kg)   LMP 01/04/2016 (Approximate)   SpO2 100%   BMI 36.41 kg/m      07/16/2024    8:04 AM 06/25/2024    9:19 AM 06/25/2024    8:35 AM  BP/Weight  Systolic BP 120 110   Diastolic BP 72 70   Wt. (Lbs) 225.6 225.6 225.6  BMI 36.41 kg/m2 36.41 kg/m2 36.41 kg/m2    Physical Exam Constitutional:      Appearance: Normal appearance.  HENT:     Right Ear: Tympanic membrane normal.     Left Ear: Tympanic membrane normal.     Nose: Nose normal.     Mouth/Throat:     Pharynx: No oropharyngeal exudate or posterior oropharyngeal erythema.  Eyes:     Conjunctiva/sclera: Conjunctivae normal.  Neck:     Vascular: No carotid bruit.  Cardiovascular:     Rate and Rhythm: Normal rate and regular rhythm.     Pulses:          Dorsalis pedis pulses are 1+ on the right side and 1+ on the left side.       Posterior tibial pulses are 1+ on the right side and 1+ on the left side.     Heart sounds: Normal heart sounds.  Pulmonary:     Effort: Pulmonary effort is normal.     Breath sounds: Normal breath sounds.  Abdominal:     General: Bowel sounds are normal.     Palpations: Abdomen is soft.     Tenderness: There is no abdominal tenderness.  Musculoskeletal:        General: Tenderness present. No deformity.     Right lower leg: No edema.     Left lower leg: No edema.  Skin:    Findings: No lesion or rash.  Neurological:     Mental Status:  She is alert and oriented to person, place, and time.  Psychiatric:        Behavior: Behavior normal.     Lab Results  Component Value Date   WBC 9.9 05/05/2024   HGB 14.1 05/05/2024   HCT 41.8 05/05/2024   PLT 249 05/05/2024   GLUCOSE 102 (H) 05/05/2024   CHOL 177 04/08/2024   TRIG 157 (H) 04/08/2024   HDL 42 04/08/2024   LDLCALC 107 (H) 04/08/2024   ALT 20 05/05/2024   AST 18 05/05/2024   NA 143 05/05/2024   K 3.9 05/05/2024   CL 107 05/05/2024   CREATININE 0.88 05/05/2024   BUN 8 05/05/2024   CO2 24 05/05/2024   TSH 0.688 01/03/2024   HGBA1C 5.5 01/03/2024      Assessment & Plan:   There are no diagnoses linked to this encounter.   Body mass index is 36.41 kg/m.   These are the goals we discussed:  Goals   None      This is a list of the screening recommended for you and due dates:  Health Maintenance  Topic Date Due   Pneumococcal Vaccination (1 of 2 - PCV) Never done   Hepatitis B Vaccine (1 of 3 - 19+ 3-dose series) Never done  Flu Shot  07/24/2024   Colon Cancer Screening  12/13/2030   DTaP/Tdap/Td vaccine (2 - Td or Tdap) 08/20/2032   Hepatitis C Screening  Completed   HIV Screening  Completed   HPV Vaccine  Aged Out   Meningitis B Vaccine  Aged Out   COVID-19 Vaccine  Discontinued     No orders of the defined types were placed in this encounter.   Follow-up: No follow-ups on file.  An After Visit Summary was printed and given to the patient.  Nola Angles, [place] [facility] [phone]

## 2024-07-21 ENCOUNTER — Other Ambulatory Visit: Payer: Self-pay | Admitting: Physician Assistant

## 2024-07-21 ENCOUNTER — Other Ambulatory Visit: Payer: Self-pay

## 2024-07-22 DIAGNOSIS — M542 Cervicalgia: Secondary | ICD-10-CM | POA: Diagnosis not present

## 2024-07-23 ENCOUNTER — Other Ambulatory Visit: Payer: Self-pay

## 2024-07-23 MED FILL — Celecoxib Cap 100 MG: ORAL | 10 days supply | Qty: 20 | Fill #0 | Status: AC

## 2024-07-23 MED FILL — Lisdexamfetamine Dimesylate Cap 60 MG: ORAL | 30 days supply | Qty: 30 | Fill #0 | Status: CN

## 2024-07-24 ENCOUNTER — Other Ambulatory Visit: Payer: Self-pay

## 2024-07-29 DIAGNOSIS — M47816 Spondylosis without myelopathy or radiculopathy, lumbar region: Secondary | ICD-10-CM | POA: Diagnosis not present

## 2024-07-31 ENCOUNTER — Ambulatory Visit
Admission: RE | Admit: 2024-07-31 | Discharge: 2024-07-31 | Disposition: A | Discharge status: Home / Self Care | Source: Ambulatory Visit | Attending: Physician Assistant | Admitting: Physician Assistant

## 2024-07-31 DIAGNOSIS — I739 Peripheral vascular disease, unspecified: Secondary | ICD-10-CM | POA: Insufficient documentation

## 2024-07-31 DIAGNOSIS — E785 Hyperlipidemia, unspecified: Secondary | ICD-10-CM | POA: Diagnosis not present

## 2024-08-04 ENCOUNTER — Ambulatory Visit: Payer: Self-pay | Admitting: Family Medicine

## 2024-08-05 ENCOUNTER — Other Ambulatory Visit: Payer: Self-pay | Admitting: Family Medicine

## 2024-08-05 DIAGNOSIS — R6889 Other general symptoms and signs: Secondary | ICD-10-CM

## 2024-08-08 ENCOUNTER — Other Ambulatory Visit: Payer: Self-pay | Admitting: Physician Assistant

## 2024-08-08 DIAGNOSIS — K2100 Gastro-esophageal reflux disease with esophagitis, without bleeding: Secondary | ICD-10-CM

## 2024-08-09 ENCOUNTER — Other Ambulatory Visit: Payer: Self-pay

## 2024-08-09 MED ORDER — SUCRALFATE 1 G PO TABS
1.0000 g | ORAL_TABLET | Freq: Two times a day (BID) | ORAL | 3 refills | Status: AC
  Filled 2024-08-09: qty 60, 30d supply, fill #0
  Filled 2024-09-22: qty 60, 30d supply, fill #1
  Filled 2024-10-25: qty 60, 30d supply, fill #2
  Filled 2025-01-06: qty 60, 30d supply, fill #3

## 2024-08-14 DIAGNOSIS — M542 Cervicalgia: Secondary | ICD-10-CM | POA: Diagnosis not present

## 2024-08-17 ENCOUNTER — Other Ambulatory Visit: Payer: Self-pay | Admitting: Cardiology

## 2024-08-17 ENCOUNTER — Other Ambulatory Visit: Payer: Self-pay

## 2024-08-17 ENCOUNTER — Other Ambulatory Visit: Payer: Self-pay | Admitting: Physician Assistant

## 2024-08-17 DIAGNOSIS — F5101 Primary insomnia: Secondary | ICD-10-CM

## 2024-08-17 MED ORDER — TOPIRAMATE 100 MG PO TABS
100.0000 mg | ORAL_TABLET | Freq: Every day | ORAL | 0 refills | Status: DC
  Filled 2024-08-17: qty 30, 30d supply, fill #0
  Filled 2024-08-17: qty 60, 60d supply, fill #0

## 2024-08-17 MED ORDER — TRAZODONE HCL 100 MG PO TABS
100.0000 mg | ORAL_TABLET | Freq: Every day | ORAL | 0 refills | Status: DC
  Filled 2024-08-17: qty 90, 90d supply, fill #0

## 2024-08-18 ENCOUNTER — Other Ambulatory Visit: Payer: Self-pay

## 2024-08-18 MED ORDER — POTASSIUM CHLORIDE ER 10 MEQ PO TBCR
10.0000 meq | EXTENDED_RELEASE_TABLET | Freq: Every day | ORAL | 0 refills | Status: DC
  Filled 2024-08-18 – 2024-09-22 (×2): qty 30, 30d supply, fill #0

## 2024-08-21 DIAGNOSIS — M542 Cervicalgia: Secondary | ICD-10-CM | POA: Diagnosis not present

## 2024-08-25 ENCOUNTER — Encounter: Payer: Self-pay | Admitting: Physician Assistant

## 2024-08-25 ENCOUNTER — Ambulatory Visit: Payer: Self-pay | Admitting: Internal Medicine

## 2024-08-26 ENCOUNTER — Other Ambulatory Visit: Payer: Self-pay

## 2024-08-26 ENCOUNTER — Other Ambulatory Visit: Payer: Self-pay | Admitting: Physician Assistant

## 2024-08-26 DIAGNOSIS — K0510 Chronic gingivitis, plaque induced: Secondary | ICD-10-CM

## 2024-08-26 MED ORDER — AMOXICILLIN-POT CLAVULANATE 875-125 MG PO TABS
1.0000 | ORAL_TABLET | Freq: Two times a day (BID) | ORAL | 0 refills | Status: DC
  Filled 2024-08-26: qty 10, 5d supply, fill #0

## 2024-08-28 DIAGNOSIS — M5412 Radiculopathy, cervical region: Secondary | ICD-10-CM | POA: Diagnosis not present

## 2024-09-01 ENCOUNTER — Other Ambulatory Visit: Payer: Self-pay

## 2024-09-01 DIAGNOSIS — I739 Peripheral vascular disease, unspecified: Secondary | ICD-10-CM

## 2024-09-05 MED FILL — Cyclobenzaprine HCl Tab 10 MG: ORAL | 90 days supply | Qty: 90 | Fill #1 | Status: AC

## 2024-09-07 ENCOUNTER — Other Ambulatory Visit: Payer: Self-pay

## 2024-09-07 MED FILL — Lisdexamfetamine Dimesylate Cap 60 MG: ORAL | 30 days supply | Qty: 30 | Fill #0 | Status: AC

## 2024-09-07 MED FILL — Omeprazole Cap Delayed Release 40 MG: ORAL | 90 days supply | Qty: 90 | Fill #2 | Status: AC

## 2024-09-09 NOTE — Progress Notes (Signed)
 "  Subjective:  Patient ID: Nichole Gonzalez, female    DOB: 11-Sep-1976  Age: 48 y.o. MRN: [id number]  Chief Complaint  Patient presents with   Obstructive Sleep Apnea    Patient states she has been wearing her CPAP.  Ask Dr. Sherre about what weight management medicine she can try after failing phentermine  and vyvanse .   HPI: Discussed the use of AI scribe software for clinical note transcription with the patient, who gave verbal consent to proceed.  History of Present Illness Nichole Gonzalez is a 47 year old female who presents for a follow-up visit.  She has been experiencing symptoms of a urinary tract infection for the past four to five days. She is unsure if she can provide a urine sample today.  She is scheduled for an epidural injection on September 30th for neck pain. She plans to see a physical therapist for stretching exercises. She experienced significant pain after a [place] the zoo, which exacerbated her neck, knee, and back pain. A muscle relaxer did not alleviate her symptoms, and her calf muscles remain tender. She has a history of neck issues, and has been told that her previous x-rays and MRIs showed nerve displacement and compression. She has tried various pain management strategies, including muscle relaxers like cyclobenzaprine  and Flexeril , but finds them ineffective.  She is currently taking Vyvanse , but it is not effective for weight management as she now weighs 230 pounds, the most she has ever weighed. Vyvanse  does not suppress her appetite, and she notes a change in her cravings from smoking to sweets. She also takes Wellbutrin  for depression but does not find it helps with appetite suppression.  Her daily routine is tiring, with long workdays from 5 AM to 7 PM, and she lacks energy to exercise after work. She typically goes to bed at 8:30 PM.  She has no issues with her CPAP machine, although she missed using it for two days due to having family at  her house. She is sleeping well but finds it difficult to wake up, especially on Mondays.          09/14/2024    1:28 PM 07/16/2024    8:03 AM 05/20/2024    7:29 AM 04/08/2024    7:41 AM 01/07/2024    8:53 AM  Depression screen PHQ 2/9  Decreased Interest 0 0 0 0 0  Down, Depressed, Hopeless 0 0 0 0 0  PHQ - 2 Score 0 0 0 0 0  Altered sleeping 0   0 0  Tired, decreased energy 0   3 2  Change in appetite 0   0 3  Feeling bad or failure about yourself  0   0 0  Trouble concentrating 0   0 0  Moving slowly or fidgety/restless 0   0 0  Suicidal thoughts 0   0 0  PHQ-9 Score 0   3 5  Difficult doing work/chores Not difficult at all   Somewhat difficult Somewhat difficult        07/16/2024    8:03 AM  Fall Risk   Falls in the past year? 0  Number falls in past yr: 0  Injury with Fall? 0  Risk for fall due to : No Fall Risks  Follow up Falls evaluation completed    Patient Care Team: Milon Cleaves, [place] as PCP - General (Physician Assistant) Burnetta Aures, MD as Consulting Physician (Orthopedic Surgery) Laqueta Ozell BIRCH, MD as Consulting Physician (Anesthesiology)  Review of Systems  Constitutional:  Negative for appetite change, fatigue and fever.  HENT:  Negative for congestion, ear pain, sinus pressure and sore throat.   Respiratory:  Negative for cough, chest tightness, shortness of breath and wheezing.   Cardiovascular:  Negative for chest pain and palpitations.  Gastrointestinal:  Negative for abdominal pain, constipation, diarrhea, nausea and vomiting.  Genitourinary:  Negative for dysuria and hematuria.  Musculoskeletal:  Negative for arthralgias, back pain, joint swelling and myalgias.  Skin:  Negative for rash.  Neurological:  Negative for dizziness, weakness and headaches.  Psychiatric/Behavioral:  Negative for dysphoric mood. The patient is not nervous/anxious.     Current Outpatient Medications on File Prior to Visit  Medication Sig Dispense Refill    albuterol  (VENTOLIN  HFA) 108 (90 Base) MCG/ACT inhaler Inhale 2 puffs into the lungs every 6 (six) hours as needed for wheezing or shortness of breath. 6.7 g 2   atorvastatin  (LIPITOR) 20 MG tablet Take 1 tablet (20 mg total) by mouth daily. 90 tablet 3   buPROPion  (WELLBUTRIN  XL) 150 MG 24 hr tablet Take 3 tablets (450 mg total) by mouth daily. 270 tablet 3   celecoxib  (CELEBREX ) 100 MG capsule Take 1 capsule (100 mg total) by mouth 2 (two) times daily. 20 capsule 0   cetirizine  (ZYRTEC  ALLERGY) 10 MG tablet Take 1 tablet (10 mg total) by mouth daily. 90 tablet 3   cevimeline  (EVOXAC ) 30 MG capsule Take 1 capsule (30 mg total) by mouth 3 (three) times daily. 90 capsule 3   citalopram  (CELEXA ) 40 MG tablet Take 1 tablet (40 mg total) by mouth daily. 90 tablet 1   cyclobenzaprine  (FLEXERIL ) 10 MG tablet Take 1 tablet (10 mg total) by mouth at bedtime. 90 tablet 1   dicyclomine  (BENTYL ) 20 MG tablet Take 1 tablet (20 mg total) by mouth every 6 (six) hours. 360 tablet 3   EPIPEN 2-PAK 0.3 MG/0.3ML SOAJ injection   0   fluticasone  (FLONASE ) 50 MCG/ACT nasal spray Place 1 spray into both nostrils once daily. 48 g 3   lisdexamfetamine  (VYVANSE ) 60 MG capsule Take 1 capsule (60 mg total) by mouth every morning. 30 capsule 0   LORazepam  (ATIVAN ) 0.5 MG tablet Take 1 tablet (0.5 mg total) by mouth 2 (two) times daily as needed (panic attacks). 30 tablet 1   omeprazole  (PRILOSEC) 40 MG capsule Take 1 capsule (40 mg total) by mouth daily. 90 capsule 3   potassium chloride  (KLOR-CON ) 10 MEQ tablet Take 1 tablet (10 mEq total) by mouth daily. Please call [phone] to schedule an appointment with Dr. Redell Leiter for future refills. Thank you. 1st attempt. 30 tablet 0   sodium fluoride  (FLUORISHIELD) 1.1 % GEL dental gel Place 1 Application onto teeth at bedtime.     sucralfate  (CARAFATE ) 1 g tablet Take 1 tablet (1 g total) by mouth 2 (two) times daily. 60 tablet 3   topiramate  (TOPAMAX ) 100 MG tablet Take 1  tablet (100 mg total) by mouth daily. 90 tablet 0   traZODone  (DESYREL ) 100 MG tablet Take 1 tablet (100 mg total) by mouth at bedtime. 90 tablet 0   varenicline  (CHANTIX ) 0.5 MG tablet Take 1 tablet (0.5 mg total) by mouth 2 (two) times daily. 56 tablet 3   No current facility-administered medications on file prior to visit.   Past Medical History:  Diagnosis Date   Abnormal thyroid  stimulating hormone (TSH) level 02/17/2015   Abnormal uterine bleeding 10/07/2020   Allergic rhinitis 09/03/2015  Depression, recurrent 09/08/2020   Gastroesophageal reflux disease 10/27/2020   Heart murmur 1988   I was young   High risk sexual behavior 11/06/2020   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   Hyperlipidemia 2023   Recently   Primary insomnia 09/08/2020   Tobacco abuse 09/03/2015   Past Surgical History:  Procedure Laterality Date   ABDOMINAL HYSTERECTOMY     APPENDECTOMY     CHOLECYSTECTOMY     DILATION AND CURETTAGE, DIAGNOSTIC / THERAPEUTIC     JOINT REPLACEMENT     KNEE ARTHROSCOPY     right foot     TUBAL LIGATION      Family History  Problem Relation Age of Onset   Thyroid  disease Sister    Coronary artery disease Father    Heart attack Father        93 he dies at age 54. His heart skipped a beat and didnt start back.   Heart disease Father        I know he had heart disease, not sure if it was a heart attach.   COPD Sister    Diabetes Mellitus II Sister    Social History   Socioeconomic History   Marital status: Married    Spouse name: Not on file   Number of children: 3   Years of education: Not on file   Highest education level: Bachelor's degree (e.g., BA, AB, BS)  Occupational History   Not on file  Tobacco Use   Smoking status: Every Day    Current packs/day: 0.25    Average packs/day: 1 pack/day for 30.9 years (30.4 ttl pk-yrs)    Types: Cigarettes    Start date: 10/25/2023    Passive exposure: Never   Smokeless tobacco: Never  Vaping Use   Vaping status:  Never Used  Substance and Sexual Activity   Alcohol use: Yes    Alcohol/week: 8.0 standard drinks of alcohol    Types: 8 Standard drinks or equivalent per week    Comment: socially   Drug use: Never   Sexual activity: Not on file  Other Topics Concern   Not on file  Social History Narrative   ** Merged History Encounter **       Social Drivers of Health   Financial Resource Strain: Low Risk  (07/16/2024)   Overall Financial Resource Strain (CARDIA)    Difficulty of Paying Living Expenses: Not very hard  Food Insecurity: No Food Insecurity (07/16/2024)   Hunger Vital Sign    Worried About Running Out of Food in the Last Year: Never true    Ran Out of Food in the Last Year: Never true  Transportation Needs: No Transportation Needs (07/16/2024)   PRAPARE - Administrator, Civil Service (Medical): No    Lack of Transportation (Non-Medical): No  Physical Activity: Inactive (07/16/2024)   Exercise Vital Sign    Days of Exercise per Week: 0 days    Minutes of Exercise per Session: Not on file  Stress: Stress Concern Present (07/16/2024)   [employer] of Occupational Health - Occupational Stress Questionnaire    Feeling of Stress: To some extent  Social Connections: Moderately Isolated (07/16/2024)   Social Connection and Isolation Panel    Frequency of Communication with Friends and Family: More than three times a week    Frequency of Social Gatherings with Friends and Family: Once a week    Attends Religious Services: Never    Database Administrator or Organizations: No  Attends Banker Meetings: Not on file    Marital Status: Married    Objective:  BP 130/64   Pulse 80   Temp 98.9 F (37.2 C)   Ht 5' 6 (1.676 m)   Wt 230 lb (104.3 kg)   LMP 01/04/2016 (Approximate)   SpO2 99%   BMI 37.12 kg/m      09/14/2024    1:25 PM 07/16/2024    8:04 AM 06/25/2024    9:19 AM  BP/Weight  Systolic BP 130 120 110  Diastolic BP 64 72 70  Wt. (Lbs) 230  225.6 225.6  BMI 37.12 kg/m2 36.41 kg/m2 36.41 kg/m2    Physical Exam Vitals reviewed.  Constitutional:      Appearance: Normal appearance. She is obese.  Cardiovascular:     Rate and Rhythm: Normal rate and regular rhythm.     Heart sounds: Normal heart sounds.  Pulmonary:     Effort: Pulmonary effort is normal.     Breath sounds: Normal breath sounds.  Abdominal:     General: Bowel sounds are normal.     Palpations: Abdomen is soft.     Tenderness: There is no abdominal tenderness.  Neurological:     Mental Status: She is alert and oriented to person, place, and time.  Psychiatric:        Mood and Affect: Mood normal.        Behavior: Behavior normal.       Lab Results  Component Value Date   WBC 9.9 05/05/2024   HGB 14.1 05/05/2024   HCT 41.8 05/05/2024   PLT 249 05/05/2024   GLUCOSE 102 (H) 05/05/2024   CHOL 177 04/08/2024   TRIG 157 (H) 04/08/2024   HDL 42 04/08/2024   LDLCALC 107 (H) 04/08/2024   ALT 20 05/05/2024   AST 18 05/05/2024   NA 143 05/05/2024   K 3.9 05/05/2024   CL 107 05/05/2024   CREATININE 0.88 05/05/2024   BUN 8 05/05/2024   CO2 24 05/05/2024   TSH 0.688 01/03/2024   HGBA1C 5.5 01/03/2024      Assessment & Plan:  OSA (obstructive sleep apnea) Assessment & Plan: Patient uses and benefits from therapy Using CPAP nightly and with naps Pressure setting is comfortable and is sleeping well. Obstructive sleep apnea is well-managed with therapy. Pulmonologist confirmed good management. - Continue current therapy.   Dysuria Assessment & Plan: Suspected urinary tract infection with symptoms present for four to five days. Need to confirm diagnosis and ensure treatment does not interfere with upcoming epidural injections. - Attempt to obtain a urine sample for analysis. - Message Doctor Laqueta to confirm if antibiotics will interfere with the scheduled injection.  Orders: -     POCT URINALYSIS DIP (CLINITEK) -     Urine Culture  Class  2 severe obesity due to excess calories with serious comorbidity and body mass index (BMI) of 37.0 to 37.9 in adult Assessment & Plan: Comorbidity include: Hyperlipidemia, OSA, GERD Obesity is not responsive to current pharmacotherapy with Vyvanse , with no appetite suppression and weight gain. Previous trials with phentermine  and Wellbutrin  were ineffective. Smoking cessation may contribute to increased cravings for sweets. - Consult with Doctor [facility] for alternative pharmacotherapy options for weight management.   Mixed hyperlipidemia Assessment & Plan: Hyperlipidemia management shows mixed results with increased triglycerides and decreased LDL levels. Current pharmacotherapy for weight management is not effectively addressing lipid levels. - Perform fingerstick cholesterol test. - Re-evaluate lipid management strategy after obtaining test  results.  Orders: -     POCT Lipid Panel  Neck pain Assessment & Plan: Cervical spinal stenosis with radiculopathy is severe, affecting daily activities with significant pain and limited mobility. She hopes to avoid surgery. - Proceed with epidural injection on September 30th. - Engage in physical therapy for stretching and symptom management. - Follow up with Doctor Laqueta and Doctor Burnetta as scheduled.   Encounter for immunization -     Flu vaccine trivalent PF, 6mos and older(Flulaval,Afluria,Fluarix,Fluzone)    Body mass index is 37.12 kg/m.   No orders of the defined types were placed in this encounter.   Orders Placed This Encounter  Procedures   Urine Culture   Flu vaccine trivalent PF, 6mos and older(Flulaval,Afluria,Fluarix,Fluzone)   POCT URINALYSIS DIP (CLINITEK)   POCT Lipid Panel       Follow-up: Return if symptoms worsen or fail to improve.   I,Lauren M Auman,acting as a neurosurgeon for [employer].,have documented all relevant documentation on the behalf of Nola Angles, PA,as directed by  Nola Angles, PA while in the  presence of Nola Angles, [place].   An After Visit Summary was printed and given to the patient.  Nola Angles, [place] [facility] [phone] "

## 2024-09-10 MED FILL — Bupropion HCl Tab ER 24HR 150 MG: ORAL | 90 days supply | Qty: 270 | Fill #2 | Status: CN

## 2024-09-14 ENCOUNTER — Ambulatory Visit: Admitting: Physician Assistant

## 2024-09-14 ENCOUNTER — Encounter: Payer: Self-pay | Admitting: Physician Assistant

## 2024-09-14 VITALS — BP 130/64 | HR 80 | Temp 98.9°F | Ht 66.0 in | Wt 230.0 lb

## 2024-09-14 DIAGNOSIS — E782 Mixed hyperlipidemia: Secondary | ICD-10-CM | POA: Diagnosis not present

## 2024-09-14 DIAGNOSIS — Z6837 Body mass index (BMI) 37.0-37.9, adult: Secondary | ICD-10-CM

## 2024-09-14 DIAGNOSIS — M542 Cervicalgia: Secondary | ICD-10-CM | POA: Diagnosis not present

## 2024-09-14 DIAGNOSIS — E66812 Obesity, class 2: Secondary | ICD-10-CM | POA: Diagnosis not present

## 2024-09-14 DIAGNOSIS — R300 Dysuria: Secondary | ICD-10-CM

## 2024-09-14 DIAGNOSIS — G4733 Obstructive sleep apnea (adult) (pediatric): Secondary | ICD-10-CM

## 2024-09-14 DIAGNOSIS — Z23 Encounter for immunization: Secondary | ICD-10-CM | POA: Diagnosis not present

## 2024-09-14 LAB — POCT LIPID PANEL
HDL: 39
LDL: 82
Non-HDL: 126
TC/HDL: 2.1
TC: 165
TRG: 218

## 2024-09-14 LAB — POCT URINALYSIS DIP (CLINITEK)
Bilirubin, UA: NEGATIVE
Blood, UA: NEGATIVE
Glucose, UA: NEGATIVE mg/dL
Ketones, POC UA: NEGATIVE mg/dL
Nitrite, UA: NEGATIVE
POC PROTEIN,UA: NEGATIVE
Spec Grav, UA: 1.01 (ref 1.010–1.025)
Urobilinogen, UA: 0.2 U/dL
pH, UA: 6 (ref 5.0–8.0)

## 2024-09-14 NOTE — Assessment & Plan Note (Signed)
 Comorbidity include: Hyperlipidemia, OSA, GERD Obesity is not responsive to current pharmacotherapy with Vyvanse , with no appetite suppression and weight gain. Previous trials with phentermine  and Wellbutrin  were ineffective. Smoking cessation may contribute to increased cravings for sweets. - Consult with Doctor [facility] for alternative pharmacotherapy options for weight management.

## 2024-09-14 NOTE — Assessment & Plan Note (Signed)
 Hyperlipidemia management shows mixed results with increased triglycerides and decreased LDL levels. Current pharmacotherapy for weight management is not effectively addressing lipid levels. - Perform fingerstick cholesterol test. - Re-evaluate lipid management strategy after obtaining test results.

## 2024-09-14 NOTE — Assessment & Plan Note (Signed)
 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 She hopes to avoid surgery. - Proceed with epidural injection on September 30th. - Engage in physical therapy for stretching and symptom management. - Follow up with Doctor Laqueta and Doctor Burnetta as scheduled.

## 2024-09-14 NOTE — Assessment & Plan Note (Signed)
 Patient uses and benefits from therapy Using CPAP nightly and with naps Pressure setting is comfortable and is sleeping well. Obstructive sleep apnea is well-managed with therapy. Pulmonologist confirmed good management. - Continue current therapy.

## 2024-09-14 NOTE — Assessment & Plan Note (Signed)
 Suspected urinary tract infection with symptoms present for four to five days. Need to confirm diagnosis and ensure treatment does not interfere with upcoming epidural injections. - Attempt to obtain a urine sample for analysis. - Message Doctor Laqueta to confirm if antibiotics will interfere with the scheduled injection.

## 2024-09-16 ENCOUNTER — Ambulatory Visit: Payer: Self-pay | Admitting: Physician Assistant

## 2024-09-16 DIAGNOSIS — M542 Cervicalgia: Secondary | ICD-10-CM | POA: Diagnosis not present

## 2024-09-17 ENCOUNTER — Encounter: Payer: Self-pay | Admitting: Physician Assistant

## 2024-09-17 LAB — URINE CULTURE

## 2024-09-21 ENCOUNTER — Other Ambulatory Visit: Payer: Self-pay | Admitting: Physician Assistant

## 2024-09-21 DIAGNOSIS — R300 Dysuria: Secondary | ICD-10-CM

## 2024-09-21 MED ORDER — NITROFURANTOIN MONOHYD MACRO 100 MG PO CAPS
100.0000 mg | ORAL_CAPSULE | Freq: Two times a day (BID) | ORAL | 0 refills | Status: DC
  Filled 2024-09-21: qty 20, 10d supply, fill #0

## 2024-09-22 ENCOUNTER — Other Ambulatory Visit: Payer: Self-pay

## 2024-09-29 ENCOUNTER — Other Ambulatory Visit: Payer: Self-pay | Admitting: Physician Assistant

## 2024-09-29 DIAGNOSIS — Z6837 Body mass index (BMI) 37.0-37.9, adult: Secondary | ICD-10-CM

## 2024-09-30 ENCOUNTER — Ambulatory Visit (HOSPITAL_COMMUNITY)
Admission: RE | Admit: 2024-09-30 | Discharge: 2024-09-30 | Disposition: A | Discharge status: Home / Self Care | Source: Ambulatory Visit | Attending: Vascular Surgery | Admitting: Vascular Surgery

## 2024-09-30 ENCOUNTER — Ambulatory Visit (INDEPENDENT_AMBULATORY_CARE_PROVIDER_SITE_OTHER): Admitting: Vascular Surgery

## 2024-09-30 ENCOUNTER — Encounter: Payer: Self-pay | Admitting: Vascular Surgery

## 2024-09-30 VITALS — BP 104/69 | HR 74 | Temp 98.1°F | Ht 66.0 in | Wt 228.0 lb

## 2024-09-30 DIAGNOSIS — M25562 Pain in left knee: Secondary | ICD-10-CM | POA: Diagnosis not present

## 2024-09-30 DIAGNOSIS — M25561 Pain in right knee: Secondary | ICD-10-CM

## 2024-09-30 DIAGNOSIS — I739 Peripheral vascular disease, unspecified: Secondary | ICD-10-CM | POA: Insufficient documentation

## 2024-09-30 LAB — VAS US ABI WITH/WO TBI
Left ABI: 1.19
Right ABI: 1.2

## 2024-09-30 NOTE — Progress Notes (Signed)
 Patient ID: Nichole Gonzalez, female   DOB: 1976-04-04, 48 y.o.   MRN: [id number]  Reason for Consult: New Patient (Initial Visit)   Referred by Sherre Clapper, MD  Subjective:     HPI:  Nichole Gonzalez is a 48 y.o. female with history of chronic pain with pain in her bilateral lower extremities mostly on the right with associated swelling at the end of the day.  She also has numbness and tingling of the bilateral upper extremities she is being evaluated at [facility] for injections.  She does not have any tissue loss or ulceration and has never had any claudication.  She is a former smoker.  Past Medical History:  Diagnosis Date   Abnormal thyroid  stimulating hormone (TSH) level 02/17/2015   Abnormal uterine bleeding 10/07/2020   Allergic rhinitis 09/03/2015   Depression, recurrent 09/08/2020   Gastroesophageal reflux disease 10/27/2020   Heart murmur 1988   I was young   High risk sexual behavior 11/06/2020   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   Hyperlipidemia 2023   Recently   Primary insomnia 09/08/2020   Tobacco abuse 09/03/2015   Family History  Problem Relation Age of Onset   Thyroid  disease Sister    Coronary artery disease Father    Heart attack Father        64 he dies at age 66. His heart skipped a beat and didnt start back.   Heart disease Father        I know he had heart disease, not sure if it was a heart attach.   COPD Sister    Diabetes Mellitus II Sister    Past Surgical History:  Procedure Laterality Date   ABDOMINAL HYSTERECTOMY     APPENDECTOMY     CHOLECYSTECTOMY     DILATION AND CURETTAGE, DIAGNOSTIC / THERAPEUTIC     JOINT REPLACEMENT     KNEE ARTHROSCOPY     right foot     TUBAL LIGATION      Short Social History:  Social History   Tobacco Use   Smoking status: Former    Current packs/day: 0.25    Average packs/day: 1 pack/day for 30.9 years (30.4 ttl pk-yrs)    Types: Cigarettes    Start date: 10/25/2023    Passive  exposure: Never   Smokeless tobacco: Never  Substance Use Topics   Alcohol use: Yes    Alcohol/week: 8.0 standard drinks of alcohol    Types: 8 Standard drinks or equivalent per week    Comment: socially    Allergies  Allergen Reactions   Morphine Anaphylaxis   Morphine And Codeine Anaphylaxis   Bee Pollen Other (See Comments)    Anaphylaxis.   Codeine Other (See Comments)    Fainting.   Rosuvastatin  Other (See Comments)    GI upset    Current Outpatient Medications  Medication Sig Dispense Refill   albuterol  (VENTOLIN  HFA) 108 (90 Base) MCG/ACT inhaler Inhale 2 puffs into the lungs every 6 (six) hours as needed for wheezing or shortness of breath. 6.7 g 2   atorvastatin  (LIPITOR) 20 MG tablet Take 1 tablet (20 mg total) by mouth daily. 90 tablet 3   buPROPion  (WELLBUTRIN  XL) 150 MG 24 hr tablet Take 3 tablets (450 mg total) by mouth daily. 270 tablet 3   celecoxib  (CELEBREX ) 100 MG capsule Take 1 capsule (100 mg total) by mouth 2 (two) times daily. 20 capsule 0   cetirizine  (ZYRTEC  ALLERGY) 10 MG tablet Take 1 tablet (10 mg  total) by mouth daily. 90 tablet 3   cevimeline  (EVOXAC ) 30 MG capsule Take 1 capsule (30 mg total) by mouth 3 (three) times daily. 90 capsule 3   citalopram  (CELEXA ) 40 MG tablet Take 1 tablet (40 mg total) by mouth daily. 90 tablet 1   cyclobenzaprine  (FLEXERIL ) 10 MG tablet Take 1 tablet (10 mg total) by mouth at bedtime. 90 tablet 1   dicyclomine  (BENTYL ) 20 MG tablet Take 1 tablet (20 mg total) by mouth every 6 (six) hours. 360 tablet 3   EPIPEN 2-PAK 0.3 MG/0.3ML SOAJ injection   0   fluticasone  (FLONASE ) 50 MCG/ACT nasal spray Place 1 spray into both nostrils once daily. 48 g 3   lisdexamfetamine (VYVANSE ) 60 MG capsule Take 1 capsule (60 mg total) by mouth every morning. 30 capsule 0   LORazepam  (ATIVAN ) 0.5 MG tablet Take 1 tablet (0.5 mg total) by mouth 2 (two) times daily as needed (panic attacks). 30 tablet 1   nitrofurantoin,  macrocrystal-monohydrate, (MACROBID) 100 MG capsule Take 1 capsule (100 mg total) by mouth 2 (two) times daily. 20 capsule 0   omeprazole  (PRILOSEC) 40 MG capsule Take 1 capsule (40 mg total) by mouth daily. 90 capsule 3   potassium chloride  (KLOR-CON ) 10 MEQ tablet Take 1 tablet (10 mEq total) by mouth daily. Please call [phone] to schedule an appointment with Dr. Redell Leiter for future refills. Thank you. 1st attempt. 30 tablet 0   sodium fluoride  (FLUORISHIELD) 1.1 % GEL dental gel Place 1 Application onto teeth at bedtime.     sucralfate  (CARAFATE ) 1 g tablet Take 1 tablet (1 g total) by mouth 2 (two) times daily. 60 tablet 3   topiramate  (TOPAMAX ) 100 MG tablet Take 1 tablet (100 mg total) by mouth daily. 90 tablet 0   traZODone  (DESYREL ) 100 MG tablet Take 1 tablet (100 mg total) by mouth at bedtime. 90 tablet 0   varenicline  (CHANTIX ) 0.5 MG tablet Take 1 tablet (0.5 mg total) by mouth 2 (two) times daily. 56 tablet 3   No current facility-administered medications for this visit.    Review of Systems  Constitutional:  Constitutional negative. HENT: HENT negative.  Eyes: Eyes negative.  Cardiovascular: Cardiovascular negative.  GI: Gastrointestinal negative.  Musculoskeletal: Positive for leg pain.  Neurological: Positive for numbness.  Hematologic: Hematologic/lymphatic negative.  Psychiatric: Psychiatric negative.        Objective:  Objective   Vitals:   09/30/24 1448  BP: 104/69  Pulse: 74  Temp: 98.1 F (36.7 C)  SpO2: 96%  Weight: 228 lb (103.4 kg)  Height: 5' 6 (1.676 m)   Body mass index is 36.8 kg/m.  Physical Exam HENT:     Head: Normocephalic.     Nose: Nose normal.     Mouth/Throat:     Mouth: Mucous membranes are moist.  Cardiovascular:     Rate and Rhythm: Normal rate.     Pulses:          Popliteal pulses are 2+ on the right side and 2+ on the left side.       Posterior tibial pulses are 2+ on the right side and 2+ on the left side.   Pulmonary:     Effort: Pulmonary effort is normal.  Musculoskeletal:        General: Normal range of motion.     Right lower leg: No edema.     Left lower leg: No edema.  Skin:    General: Skin is warm.  Capillary Refill: Capillary refill takes less than 2 seconds.  Neurological:     General: No focal deficit present.     Mental Status: She is alert.     Data: ABI Findings:  +---------+------------------+-----+---------+--------+  Right   Rt Pressure (mmHg)IndexWaveform Comment   +---------+------------------+-----+---------+--------+  Brachial 118                                       +---------+------------------+-----+---------+--------+  PTA     148               1.20 triphasic          +---------+------------------+-----+---------+--------+  DP      140               1.14 biphasic           +---------+------------------+-----+---------+--------+  Great Toe107               0.87 Normal             +---------+------------------+-----+---------+--------+   +---------+------------------+-----+---------+-------+  Left    Lt Pressure (mmHg)IndexWaveform Comment  +---------+------------------+-----+---------+-------+  Brachial 123                                      +---------+------------------+-----+---------+-------+  PTA     146               1.19 triphasic         +---------+------------------+-----+---------+-------+  DP      116               0.94 biphasic          +---------+------------------+-----+---------+-------+  Great Toe93                0.76 Normal            +---------+------------------+-----+---------+-------+   +-------+-----------+-----------+------------+------------+  ABI/TBIToday's ABIToday's TBIPrevious ABIPrevious TBI  +-------+-----------+-----------+------------+------------+  Right 1.20       0.87                                  +-------+-----------+-----------+------------+------------+  Left  1.19       0.76                                 +-------+-----------+-----------+------------+------------+        Summary:  Right: Resting right ankle-brachial index is within normal range. The  right toe-brachial index is normal.    Left: Resting left ankle-brachial index is within normal range. The left  toe-brachial index is normal.         Assessment/Plan:     48 year old female with history of pain as above.  Her pedal pulses are readily palpable does not appear to be related to arterial insufficiency.  She can wear compression stockings as needed of her lower extremities to prevent swelling.  She can see me on an as-needed basis.     Penne Lonni Colorado MD [facility]

## 2024-10-01 ENCOUNTER — Encounter (INDEPENDENT_AMBULATORY_CARE_PROVIDER_SITE_OTHER): Payer: Self-pay

## 2024-10-01 ENCOUNTER — Other Ambulatory Visit: Payer: Self-pay | Admitting: Physician Assistant

## 2024-10-01 DIAGNOSIS — E66812 Obesity, class 2: Secondary | ICD-10-CM

## 2024-10-01 MED FILL — Bupropion HCl Tab ER 24HR 150 MG: ORAL | 90 days supply | Qty: 270 | Fill #2 | Status: AC

## 2024-10-06 ENCOUNTER — Encounter (INDEPENDENT_AMBULATORY_CARE_PROVIDER_SITE_OTHER): Payer: Self-pay

## 2024-10-14 ENCOUNTER — Other Ambulatory Visit: Payer: Self-pay | Admitting: Medical Genetics

## 2024-10-14 DIAGNOSIS — Z006 Encounter for examination for normal comparison and control in clinical research program: Secondary | ICD-10-CM

## 2024-10-15 ENCOUNTER — Other Ambulatory Visit: Payer: Self-pay

## 2024-10-20 DIAGNOSIS — M5412 Radiculopathy, cervical region: Secondary | ICD-10-CM | POA: Diagnosis not present

## 2024-10-21 ENCOUNTER — Other Ambulatory Visit: Payer: Self-pay | Admitting: Hematology and Oncology

## 2024-10-21 DIAGNOSIS — Z148 Genetic carrier of other disease: Secondary | ICD-10-CM

## 2024-10-21 DIAGNOSIS — D72828 Other elevated white blood cell count: Secondary | ICD-10-CM

## 2024-10-23 ENCOUNTER — Other Ambulatory Visit: Payer: Self-pay

## 2024-10-23 ENCOUNTER — Other Ambulatory Visit: Payer: Self-pay | Admitting: Cardiology

## 2024-10-23 MED ORDER — POTASSIUM CHLORIDE ER 10 MEQ PO TBCR
10.0000 meq | EXTENDED_RELEASE_TABLET | Freq: Every day | ORAL | 0 refills | Status: DC
  Filled 2024-10-23: qty 15, 15d supply, fill #0

## 2024-10-29 DIAGNOSIS — G4733 Obstructive sleep apnea (adult) (pediatric): Secondary | ICD-10-CM | POA: Diagnosis not present

## 2024-10-30 ENCOUNTER — Encounter: Payer: Self-pay | Admitting: Physician Assistant

## 2024-11-02 ENCOUNTER — Other Ambulatory Visit: Payer: Self-pay

## 2024-11-02 ENCOUNTER — Encounter: Payer: Self-pay | Admitting: Physician Assistant

## 2024-11-02 MED ORDER — BENZONATATE 200 MG PO CAPS
200.0000 mg | ORAL_CAPSULE | Freq: Three times a day (TID) | ORAL | 0 refills | Status: AC | PRN
  Filled 2024-11-02: qty 21, 7d supply, fill #0

## 2024-11-04 NOTE — Progress Notes (Cosign Needed Addendum)
 [facility]  [address] [place],  [place]  [address] [phone]    Addendum: Her potassium came back at 2.7.  She is on potassium chloride  10 mEq daily due to previous hypokalemia.  Due to the late hour, we cannot give any IV potassium today and she would prefer not to come back tomorrow.  I will have her take potassium chloride  40 mEq daily until she follows up with her PCP next week.  Clinic Day:  11/05/2024  Referring physician: Milon Cleaves, PA   HISTORY OF PRESENT ILLNESS:  The patient is a 48 y.o. female with leukocytosis felt to be due to smoking, which improved with smoking cessation, and more recently had resolved.  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  Her iron levels have not at a level where phlebotomy was be indicated.  She is here today for repeat clinical assessment.  She reports having a respiratory infection last week with cough, which is improving.  She was given Tessalon  Perles for the cough.  She also had a steroid injection in her neck about 2 weeks ago.  She states her neck pain has improved but not resolved.  She denies smoking.  She states her appetite has been somewhat decreased.  She states she sees her PCP again next week.  VITALS:   Blood pressure (!) 138/91, pulse 89, temperature 98.4 F (36.9 C), temperature source Oral, resp. rate 18, height 5' 6 (1.676 m), weight 226 lb (102.5 kg), last menstrual period 01/04/2016, SpO2 98%. Wt Readings from Last 3 Encounters:  11/05/24 226 lb (102.5 kg)  09/30/24 228 lb (103.4 kg)  09/14/24 230 lb (104.3 kg)   Body mass index is 36.48 kg/m.  Performance status (ECOG): 1 - Symptomatic but completely ambulatory  PHYSICAL EXAM:   Physical Exam Vitals and nursing note reviewed.  Constitutional:      General: She is not in acute distress.    Appearance: Normal appearance. She is not ill-appearing.  HENT:      Head: Normocephalic and atraumatic.     Mouth/Throat:     Mouth: Mucous membranes are moist.     Pharynx: Oropharynx is clear. No oropharyngeal exudate or posterior oropharyngeal erythema.  Eyes:     General: No scleral icterus.    Extraocular Movements: Extraocular movements intact.     Conjunctiva/sclera: Conjunctivae normal.     Pupils: Pupils are equal, round, and reactive to light.  Cardiovascular:     Rate and Rhythm: Normal rate and regular rhythm.     Heart sounds: Normal heart sounds. No murmur heard.    No friction rub. No gallop.  Pulmonary:     Effort: Pulmonary effort is normal.     Breath sounds: Normal breath sounds. No wheezing, rhonchi or rales.  Abdominal:     General: There is no distension.     Palpations: Abdomen is soft. There is no hepatomegaly, splenomegaly or mass.     Tenderness: There is no abdominal tenderness.  Musculoskeletal:        General: Normal range of motion.     Cervical back: Normal range of motion and neck supple. No tenderness.     Right lower leg: No edema.     Left lower leg: No edema.  Lymphadenopathy:     Cervical: No cervical adenopathy.     Upper Body:     Right upper body: No supraclavicular or axillary  adenopathy.     Left upper body: No supraclavicular or axillary adenopathy.     Lower Body: No right inguinal adenopathy. No left inguinal adenopathy.  Skin:    General: Skin is warm and dry.     Coloration: Skin is not jaundiced.     Findings: No rash.  Neurological:     Mental Status: She is alert and oriented to person, place, and time.     Cranial Nerves: No cranial nerve deficit.  Psychiatric:        Mood and Affect: Mood normal.        Behavior: Behavior normal.        Thought Content: Thought content normal.      LABS:      Latest Ref Rng & Units 11/05/2024    3:08 PM 05/05/2024    3:07 PM 04/08/2024    8:15 AM  CBC  WBC 4.0 - 10.5 K/uL 11.6  9.9  10.8   Hemoglobin 12.0 - 15.0 g/dL 85.6  85.8  85.1    Hematocrit 36.0 - 46.0 % 42.0  41.8  44.7   Platelets 150 - 400 K/uL 314  249  271       Latest Ref Rng & Units 05/05/2024    3:07 PM 04/08/2024    8:15 AM 01/03/2024    7:39 AM  CMP  Glucose 70 - 99 mg/dL 897  91  85   BUN 6 - 20 mg/dL 8  7  7    Creatinine 0.44 - 1.00 mg/dL 9.11  9.10  9.05   Sodium 135 - 145 mmol/L 143  141  140   Potassium 3.5 - 5.1 mmol/L 3.9  4.2  4.2   Chloride 98 - 111 mmol/L 107  105  103   CO2 22 - 32 mmol/L 24  23  20    Calcium  8.9 - 10.3 mg/dL 9.4  9.3  9.5   Total Protein 6.5 - 8.1 g/dL 6.3  6.8  6.8   Total Bilirubin 0.0 - 1.2 mg/dL <9.7  0.3  0.3   Alkaline Phos 38 - 126 U/L 100  127  92   AST 15 - 41 U/L 18  20  22    ALT 0 - 44 U/L 20  27  26      Lab Results  Component Value Date   TOTALPROTELP 6.7 03/11/2023   Lab Results  Component Value Date   TIBC 283 05/05/2024   TIBC 259 04/08/2024   TIBC 241 (L) 02/26/2023   FERRITIN 133 05/05/2024   FERRITIN 296 (H) 04/08/2024   FERRITIN 145 10/18/2023   IRONPCTSAT 24 05/05/2024   IRONPCTSAT 42 04/08/2024   IRONPCTSAT 46 02/26/2023        Component Value Date/Time   TOTALPROTELP 6.7 03/11/2023 0938   IGGSERUM 966 03/11/2023 0938   IGMSERUM 120 03/11/2023 0938    Review Flowsheet  More data exists      Latest Ref Rng & Units 10/18/2023 04/08/2024 05/05/2024  Oncology Labs  Ferritin 11 - 307 ng/mL 145  296  133   %SAT 10.4 - 31.8 % - 42  24      STUDIES:   No results found.    ASSESSMENT & PLAN:   Assessment/Plan:  48 y.o. female with leukocytosis which resolved after smoking cessation.  She has recurrent mild leukocytosis today likely due to recent infection and recent steroid injection.  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  This is  pending from today.  I will plan to see her back in 6 months for repeat clinical assessment.  The patient understands all the plans discussed today and is in agreement with them.  She knows to contact our office  if she develops concerns prior to her next appointment.     Andrez DELENA Foy, PA-C   Physician Assistant [facility] [phone]

## 2024-11-05 ENCOUNTER — Inpatient Hospital Stay: Admitting: Hematology and Oncology

## 2024-11-05 ENCOUNTER — Other Ambulatory Visit: Payer: Self-pay

## 2024-11-05 ENCOUNTER — Encounter: Payer: Self-pay | Admitting: Hematology and Oncology

## 2024-11-05 ENCOUNTER — Inpatient Hospital Stay: Attending: Hematology and Oncology

## 2024-11-05 ENCOUNTER — Telehealth: Payer: Self-pay

## 2024-11-05 VITALS — BP 130/78 | HR 89 | Temp 98.4°F | Resp 18 | Ht 66.0 in | Wt 226.0 lb

## 2024-11-05 DIAGNOSIS — Z148 Genetic carrier of other disease: Secondary | ICD-10-CM

## 2024-11-05 DIAGNOSIS — Z87891 Personal history of nicotine dependence: Secondary | ICD-10-CM | POA: Diagnosis not present

## 2024-11-05 DIAGNOSIS — D72828 Other elevated white blood cell count: Secondary | ICD-10-CM

## 2024-11-05 DIAGNOSIS — Z79899 Other long term (current) drug therapy: Secondary | ICD-10-CM | POA: Diagnosis not present

## 2024-11-05 DIAGNOSIS — M542 Cervicalgia: Secondary | ICD-10-CM | POA: Insufficient documentation

## 2024-11-05 DIAGNOSIS — J988 Other specified respiratory disorders: Secondary | ICD-10-CM | POA: Diagnosis not present

## 2024-11-05 DIAGNOSIS — D72829 Elevated white blood cell count, unspecified: Secondary | ICD-10-CM | POA: Diagnosis not present

## 2024-11-05 DIAGNOSIS — E876 Hypokalemia: Secondary | ICD-10-CM | POA: Diagnosis not present

## 2024-11-05 LAB — CBC WITH DIFFERENTIAL (CANCER CENTER ONLY)
Abs Immature Granulocytes: 0.02 K/uL (ref 0.00–0.07)
Basophils Absolute: 0.1 K/uL (ref 0.0–0.1)
Basophils Relative: 1 %
Eosinophils Absolute: 0.3 K/uL (ref 0.0–0.5)
Eosinophils Relative: 2 %
HCT: 42 % (ref 36.0–46.0)
Hemoglobin: 14.3 g/dL (ref 12.0–15.0)
Immature Granulocytes: 0 %
Lymphocytes Relative: 41 %
Lymphs Abs: 4.7 K/uL — ABNORMAL HIGH (ref 0.7–4.0)
MCH: 31.5 pg (ref 26.0–34.0)
MCHC: 34 g/dL (ref 30.0–36.0)
MCV: 92.5 fL (ref 80.0–100.0)
Monocytes Absolute: 0.6 K/uL (ref 0.1–1.0)
Monocytes Relative: 5 %
Neutro Abs: 5.8 K/uL (ref 1.7–7.7)
Neutrophils Relative %: 51 %
Platelet Count: 314 K/uL (ref 150–400)
RBC: 4.54 MIL/uL (ref 3.87–5.11)
RDW: 13.5 % (ref 11.5–15.5)
WBC Count: 11.6 K/uL — ABNORMAL HIGH (ref 4.0–10.5)
nRBC: 0 % (ref 0.0–0.2)

## 2024-11-05 LAB — CMP (CANCER CENTER ONLY)
ALT: 40 U/L (ref 0–44)
AST: 48 U/L — ABNORMAL HIGH (ref 15–41)
Albumin: 3.8 g/dL (ref 3.5–5.0)
Alkaline Phosphatase: 118 U/L (ref 38–126)
Anion gap: 12 (ref 5–15)
BUN: 5 mg/dL — ABNORMAL LOW (ref 6–20)
CO2: 26 mmol/L (ref 22–32)
Calcium: 8.8 mg/dL — ABNORMAL LOW (ref 8.9–10.3)
Chloride: 104 mmol/L (ref 98–111)
Creatinine: 0.88 mg/dL (ref 0.44–1.00)
GFR, Estimated: >60 mL/min (ref >60)
Glucose, Bld: 121 mg/dL — ABNORMAL HIGH (ref 70–99)
Potassium: 2.7 mmol/L — CL (ref 3.5–5.1)
Sodium: 142 mmol/L (ref 135–145)
Total Bilirubin: <0.2 mg/dL (ref 0.0–1.2)
Total Protein: 6.7 g/dL (ref 6.5–8.1)

## 2024-11-05 LAB — IRON AND TIBC
Iron: 49 ug/dL (ref 28–170)
Saturation Ratios: 21 % (ref 10.4–31.8)
TIBC: 239 ug/dL — ABNORMAL LOW (ref 250–450)
UIBC: 190 ug/dL

## 2024-11-05 LAB — FERRITIN: Ferritin: 373 ng/mL — ABNORMAL HIGH (ref 11–307)

## 2024-11-05 MED ORDER — POTASSIUM CHLORIDE CRYS ER 20 MEQ PO TBCR
20.0000 meq | EXTENDED_RELEASE_TABLET | Freq: Two times a day (BID) | ORAL | 0 refills | Status: AC
  Filled 2024-11-05: qty 14, 7d supply, fill #0

## 2024-11-05 NOTE — Telephone Encounter (Addendum)
 CRITICAL VALUE STICKER  CRITICAL VALUE:  Potassium  2.7  RECEIVER (on-site recipient of call):  Mycheal Veldhuizen,RN  DATE & TIME NOTIFIED: 11/05/2024 @ 1553  MESSENGER (representative from lab): Gordy Claven malm  MD NOTIFIED: [facility]  TIME OF NOTIFICATION: 11/05/2024 @ 1554  RESPONSE:  Prescription sent to pharmacy.

## 2024-11-06 ENCOUNTER — Other Ambulatory Visit: Payer: Self-pay | Admitting: Cardiology

## 2024-11-06 ENCOUNTER — Telehealth: Payer: Self-pay | Admitting: Hematology and Oncology

## 2024-11-06 NOTE — Telephone Encounter (Signed)
 Patient has been scheduled for follow-up visit per 11/05/24 LOS.  Pt aware of scheduled appt details.

## 2024-11-07 ENCOUNTER — Other Ambulatory Visit: Payer: Self-pay | Admitting: Physician Assistant

## 2024-11-07 ENCOUNTER — Other Ambulatory Visit: Payer: Self-pay

## 2024-11-08 ENCOUNTER — Other Ambulatory Visit: Payer: Self-pay

## 2024-11-09 ENCOUNTER — Other Ambulatory Visit: Payer: Self-pay

## 2024-11-10 ENCOUNTER — Ambulatory Visit: Admitting: Physician Assistant

## 2024-11-10 ENCOUNTER — Institutional Professional Consult (permissible substitution): Admitting: Bariatrics

## 2024-11-10 ENCOUNTER — Telehealth: Payer: Self-pay

## 2024-11-10 NOTE — Telephone Encounter (Signed)
-----   Message from Andrez DELENA Foy sent at 11/06/2024  5:10 PM EST ----- Please let her know her iron studies are stable. Keep appt in 6 months. Thanks

## 2024-11-10 NOTE — Telephone Encounter (Signed)
 Left message with [name] as Nichole Gonzalez's voice mail was full.

## 2024-11-11 ENCOUNTER — Encounter: Payer: Self-pay | Admitting: Nurse Practitioner

## 2024-11-11 ENCOUNTER — Ambulatory Visit: Admitting: Nurse Practitioner

## 2024-11-11 VITALS — BP 128/84 | HR 82 | Temp 97.9°F | Ht 66.0 in | Wt 221.0 lb

## 2024-11-11 DIAGNOSIS — G4733 Obstructive sleep apnea (adult) (pediatric): Secondary | ICD-10-CM | POA: Diagnosis not present

## 2024-11-11 DIAGNOSIS — Z6835 Body mass index (BMI) 35.0-35.9, adult: Secondary | ICD-10-CM

## 2024-11-11 DIAGNOSIS — E66812 Obesity, class 2: Secondary | ICD-10-CM | POA: Diagnosis not present

## 2024-11-11 DIAGNOSIS — E782 Mixed hyperlipidemia: Secondary | ICD-10-CM | POA: Diagnosis not present

## 2024-11-11 NOTE — Progress Notes (Signed)
 Office: [phone]  /  Fax: [phone]   Initial Visit  Nichole Gonzalez was seen in clinic today to evaluate for obesity. She is interested in losing weight to improve overall health and reduce the risk of weight related complications. She presents today to review program treatment options, initial physical assessment, and evaluation.     She was referred by: PCP  When asked what else they would like to accomplish? She states: Adopt a healthier eating pattern and lifestyle, Improve energy levels and physical activity, Improve existing medical conditions, and Improve quality of life   When asked how has your weight affected you? She states: Contributed to orthopedic problems or mobility issues, Having fatigue, and Having poor endurance  Some associated conditions: migraines, allergies, OSAS on CPAP, GERD, IBS, liver nodule, low back pain, HLD,   Contributing factors: family history of obesity, consumption of processed foods, use of obesogenic medications: Psychotropic medications, Steroids, and Contraceptives or hormonal therapy, reduced physical activity, chronic skipping of meals, and sedentary job  Weight promoting medications identified: Psychotropic medications, Steroids, and Contraceptives or hormonal therapy  Current nutrition plan: None  Current level of physical activity: None  Current or previous pharmacotherapy: Phentermine   Response to medication: Lost weight initially but was unable to sustain weight loss   Past medical history includes:   Past Medical History:  Diagnosis Date   Abnormal thyroid  stimulating hormone (TSH) level 02/17/2015   Abnormal uterine bleeding 10/07/2020   Allergic rhinitis 09/03/2015   Depression, recurrent 09/08/2020   Gastroesophageal reflux disease 10/27/2020   Heart murmur 1988   I was young   High risk sexual behavior 11/06/2020   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   Hyperlipidemia 2023   Recently   Primary insomnia 09/08/2020    Tobacco abuse 09/03/2015     Objective:   BP 128/84   Pulse 82   Temp 97.9 F (36.6 C)   Ht 5' 6 (1.676 m)   Wt 221 lb (100.2 kg)   LMP 01/04/2016 (Approximate)   SpO2 100%   BMI 35.67 kg/m  She was weighed on the bioimpedance scale: Body mass index is 35.67 kg/m.  Peak Weight:228 lbs , Body Fat%:46 %, Visceral Fat Rating:12, Weight trend over the last 12 months: fluctuated   General:  Alert, oriented and cooperative. Patient is in no acute distress.  Respiratory: Normal respiratory effort, no problems with respiration noted   Gait: able to ambulate independently  Mental Status: Normal mood and affect. Normal behavior. Normal judgment and thought content.   DIAGNOSTIC DATA REVIEWED:  BMET    Component Value Date/Time   NA 142 11/05/2024 1508   NA 141 04/08/2024 0815   K 2.7 (LL) 11/05/2024 1508   CL 104 11/05/2024 1508   CO2 26 11/05/2024 1508   GLUCOSE 121 (H) 11/05/2024 1508   BUN 5 (L) 11/05/2024 1508   BUN 7 04/08/2024 0815   CREATININE 0.88 11/05/2024 1508   CALCIUM  8.8 (L) 11/05/2024 1508   GFRNONAA >60 11/05/2024 1508   GFRAA 116 11/01/2020 1130   Lab Results  Component Value Date   HGBA1C 5.5 01/03/2024   HGBA1C 5.6 02/26/2023   No results found for: INSULIN CBC    Component Value Date/Time   WBC 11.6 (H) 11/05/2024 1508   WBC 10.1 07/17/2023 1205   RBC 4.54 11/05/2024 1508   HGB 14.3 11/05/2024 1508   HGB 14.8 04/08/2024 0815   HCT 42.0 11/05/2024 1508   HCT 44.7 04/08/2024 0815   PLT 314 11/05/2024 1508  PLT 271 04/08/2024 0815   MCV 92.5 11/05/2024 1508   MCV 97 04/08/2024 0815   MCV 99 11/28/2020 0000   MCH 31.5 11/05/2024 1508   MCHC 34.0 11/05/2024 1508   RDW 13.5 11/05/2024 1508   RDW 12.1 04/08/2024 0815   Iron/TIBC/Ferritin/ %Sat    Component Value Date/Time   IRON 49 11/05/2024 1508   IRON 109 04/08/2024 0815   TIBC 239 (L) 11/05/2024 1508   TIBC 259 04/08/2024 0815   FERRITIN 373 (H) 11/05/2024 1508   FERRITIN 296  (H) 04/08/2024 0815   IRONPCTSAT 21 11/05/2024 1508   IRONPCTSAT 42 04/08/2024 0815   Lipid Panel     Component Value Date/Time   CHOL 177 04/08/2024 0815   TRIG 157 (H) 04/08/2024 0815   HDL 42 04/08/2024 0815   CHOLHDL 4.2 04/08/2024 0815   LDLCALC 107 (H) 04/08/2024 0815   Hepatic Function Panel     Component Value Date/Time   PROT 6.7 11/05/2024 1508   PROT 6.8 04/08/2024 0815   ALBUMIN 3.8 11/05/2024 1508   ALBUMIN 4.3 04/08/2024 0815   AST 48 (H) 11/05/2024 1508   ALT 40 11/05/2024 1508   ALKPHOS 118 11/05/2024 1508   BILITOT <0.2 11/05/2024 1508      Component Value Date/Time   TSH 0.688 01/03/2024 0739     Assessment and Plan:   OSA (obstructive sleep apnea) Continue CPAP nightly  Mixed hyperlipidemia Continue Lipitor 20mg .  Managed by PCP.  Continue to follow up with PCP.  Obesity, Class II, BMI 35-39.9        Obesity Treatment / Action Plan:  Patient will work on garnering support from family and friends to begin weight loss journey. Will work on eliminating or reducing the presence of highly palatable, calorie dense foods in the home. Will complete provided nutritional and psychosocial assessment questionnaire before the next appointment. Will be scheduled for indirect calorimetry to determine resting energy expenditure in a fasting state.  This will allow us  to create a reduced calorie, high-protein meal plan to promote loss of fat mass while preserving muscle mass. Counseled on the health benefits of losing 5%-15% of total body weight. Was counseled on nutritional approaches to weight loss and benefits of reducing processed foods and consuming plant-based foods and high quality protein as part of nutritional weight management. Was counseled on pharmacotherapy and role as an adjunct in weight management.   Obesity Education Performed Today:  She was weighed on the bioimpedance scale and results were discussed and documented in the synopsis.  We  discussed obesity as a disease and the importance of a more detailed evaluation of all the factors contributing to the disease.  We discussed the importance of long term lifestyle changes which include nutrition, exercise and behavioral modifications as well as the importance of customizing this to her specific health and social needs.  We discussed the benefits of reaching a healthier weight to alleviate the symptoms of existing conditions and reduce the risks of the biomechanical, metabolic and psychological effects of obesity.  Nichole Gonzalez appears to be in the action stage of change and states they are ready to start intensive lifestyle modifications and behavioral modifications.  30 minutes was spent today on this visit including the above counseling, pre-visit chart review, and post-visit documentation.  Reviewed by clinician on day of visit: allergies, medications, problem list, medical history, surgical history, family history, social history, and previous encounter notes pertinent to obesity diagnosis.    Nichole SAUNDERS Oda Placke  FNP-C

## 2024-11-13 ENCOUNTER — Other Ambulatory Visit: Payer: Self-pay | Admitting: Cardiology

## 2024-11-13 ENCOUNTER — Other Ambulatory Visit: Payer: Self-pay

## 2024-11-13 ENCOUNTER — Other Ambulatory Visit: Payer: Self-pay | Admitting: Hematology and Oncology

## 2024-11-13 ENCOUNTER — Other Ambulatory Visit: Payer: Self-pay | Admitting: Physician Assistant

## 2024-11-13 ENCOUNTER — Telehealth: Payer: Self-pay

## 2024-11-13 ENCOUNTER — Other Ambulatory Visit: Payer: Self-pay | Admitting: Family Medicine

## 2024-11-13 DIAGNOSIS — F5101 Primary insomnia: Secondary | ICD-10-CM

## 2024-11-13 DIAGNOSIS — E876 Hypokalemia: Secondary | ICD-10-CM

## 2024-11-13 MED ORDER — TRAZODONE HCL 100 MG PO TABS
100.0000 mg | ORAL_TABLET | Freq: Every day | ORAL | 0 refills | Status: AC
  Filled 2024-11-13: qty 90, 90d supply, fill #0

## 2024-11-13 MED ORDER — FLUTICASONE PROPIONATE 50 MCG/ACT NA SUSP
1.0000 | Freq: Every day | NASAL | 3 refills | Status: AC
  Filled 2024-11-13: qty 48, 90d supply, fill #0
  Filled 2024-11-25: qty 16, 60d supply, fill #0
  Filled 2025-01-20: qty 32, 120d supply, fill #1

## 2024-11-13 MED ORDER — POTASSIUM CHLORIDE ER 10 MEQ PO TBCR
10.0000 meq | EXTENDED_RELEASE_TABLET | Freq: Every day | ORAL | 0 refills | Status: DC
  Filled 2024-11-13 – 2024-11-25 (×2): qty 15, 15d supply, fill #0

## 2024-11-13 MED ORDER — TOPIRAMATE 100 MG PO TABS
100.0000 mg | ORAL_TABLET | Freq: Every day | ORAL | 0 refills | Status: AC
  Filled 2024-11-13: qty 90, 90d supply, fill #0

## 2024-11-13 NOTE — Telephone Encounter (Signed)
-----   Message from Andrez DELENA Foy sent at 11/13/2024  3:40 PM EST ----- When I saw her and gave her the rx I was given the impression she was seeing her PCP the following week. ----- Message ----- From: Sheron Falling, RN Sent: 11/13/2024   2:54 PM EST To: Andrez DELENA Foy, PA-C   Spoke with patient, she states that she is not able to see her PCP until the end of December, he is out of town.  I suggested another provider in the practice, maybe NP or PA. ----- Message ----- From: Foy Andrez DELENA, PA-C Sent: 11/13/2024  12:36 PM EST To: Falling Sheron, RN  Please let her know I did not refill her potassium, as I had wanted her to follow-up with her primary care for management of this.  Thank you

## 2024-11-14 ENCOUNTER — Other Ambulatory Visit: Payer: Self-pay

## 2024-11-16 ENCOUNTER — Other Ambulatory Visit: Payer: Self-pay

## 2024-11-22 ENCOUNTER — Other Ambulatory Visit: Payer: Self-pay

## 2024-11-24 ENCOUNTER — Other Ambulatory Visit: Payer: Self-pay

## 2024-11-25 ENCOUNTER — Other Ambulatory Visit: Payer: Self-pay

## 2024-11-28 DIAGNOSIS — G4733 Obstructive sleep apnea (adult) (pediatric): Secondary | ICD-10-CM | POA: Diagnosis not present

## 2024-11-30 ENCOUNTER — Ambulatory Visit: Admitting: Bariatrics

## 2024-12-07 ENCOUNTER — Other Ambulatory Visit: Payer: Self-pay | Admitting: Cardiology

## 2024-12-07 ENCOUNTER — Other Ambulatory Visit: Payer: Self-pay

## 2024-12-07 MED FILL — Omeprazole Cap Delayed Release 40 MG: ORAL | 90 days supply | Qty: 90 | Fill #3 | Status: CN

## 2024-12-08 ENCOUNTER — Other Ambulatory Visit: Payer: Self-pay

## 2024-12-14 ENCOUNTER — Ambulatory Visit: Admitting: Bariatrics

## 2024-12-16 ENCOUNTER — Other Ambulatory Visit: Payer: Self-pay | Admitting: Cardiology

## 2024-12-18 ENCOUNTER — Other Ambulatory Visit: Payer: Self-pay

## 2024-12-21 ENCOUNTER — Other Ambulatory Visit: Payer: Self-pay

## 2024-12-21 MED FILL — Omeprazole Cap Delayed Release 40 MG: ORAL | 90 days supply | Qty: 90 | Fill #3 | Status: AC

## 2024-12-23 ENCOUNTER — Ambulatory Visit: Admitting: Physician Assistant

## 2025-01-01 ENCOUNTER — Other Ambulatory Visit: Payer: Self-pay | Admitting: Cardiology

## 2025-01-01 ENCOUNTER — Other Ambulatory Visit: Payer: Self-pay | Admitting: Physician Assistant

## 2025-01-01 ENCOUNTER — Other Ambulatory Visit (HOSPITAL_COMMUNITY): Payer: Self-pay

## 2025-01-01 ENCOUNTER — Other Ambulatory Visit: Payer: Self-pay

## 2025-01-01 MED ORDER — CITALOPRAM HYDROBROMIDE 40 MG PO TABS
40.0000 mg | ORAL_TABLET | Freq: Every day | ORAL | 3 refills | Status: AC
  Filled 2025-01-01 (×2): qty 90, 90d supply, fill #0
  Filled 2025-01-07: qty 36, 36d supply, fill #0
  Filled 2025-01-07: qty 54, 54d supply, fill #0

## 2025-01-01 MED FILL — Bupropion HCl Tab ER 24HR 150 MG: ORAL | 90 days supply | Qty: 270 | Fill #3 | Status: CN

## 2025-01-05 ENCOUNTER — Other Ambulatory Visit (HOSPITAL_COMMUNITY): Payer: Self-pay

## 2025-01-06 ENCOUNTER — Other Ambulatory Visit: Payer: Self-pay | Admitting: Cardiology

## 2025-01-07 ENCOUNTER — Other Ambulatory Visit: Payer: Self-pay

## 2025-01-07 ENCOUNTER — Other Ambulatory Visit (HOSPITAL_COMMUNITY): Payer: Self-pay

## 2025-01-07 MED FILL — Bupropion HCl Tab ER 24HR 150 MG: ORAL | 90 days supply | Qty: 270 | Fill #3 | Status: AC

## 2025-01-08 ENCOUNTER — Other Ambulatory Visit: Payer: Self-pay

## 2025-01-08 ENCOUNTER — Ambulatory Visit: Admitting: Physician Assistant

## 2025-01-08 ENCOUNTER — Other Ambulatory Visit (HOSPITAL_COMMUNITY): Payer: Self-pay

## 2025-01-08 VITALS — BP 124/86 | HR 82 | Temp 97.8°F | Ht 66.0 in | Wt 225.5 lb

## 2025-01-08 DIAGNOSIS — E66812 Obesity, class 2: Secondary | ICD-10-CM

## 2025-01-08 DIAGNOSIS — F330 Major depressive disorder, recurrent, mild: Secondary | ICD-10-CM

## 2025-01-08 DIAGNOSIS — E782 Mixed hyperlipidemia: Secondary | ICD-10-CM | POA: Diagnosis not present

## 2025-01-08 DIAGNOSIS — F5101 Primary insomnia: Secondary | ICD-10-CM

## 2025-01-08 DIAGNOSIS — G43009 Migraine without aura, not intractable, without status migrainosus: Secondary | ICD-10-CM | POA: Diagnosis not present

## 2025-01-08 DIAGNOSIS — M542 Cervicalgia: Secondary | ICD-10-CM

## 2025-01-08 DIAGNOSIS — K580 Irritable bowel syndrome with diarrhea: Secondary | ICD-10-CM | POA: Diagnosis not present

## 2025-01-08 DIAGNOSIS — G4733 Obstructive sleep apnea (adult) (pediatric): Secondary | ICD-10-CM | POA: Diagnosis not present

## 2025-01-08 DIAGNOSIS — E876 Hypokalemia: Secondary | ICD-10-CM | POA: Diagnosis not present

## 2025-01-08 NOTE — Assessment & Plan Note (Addendum)
 Denies any new or worsening symptoms Continue to monitor Continue taking Trazodone  100mg  as prescribed Will adjust treatment based on symptoms

## 2025-01-08 NOTE — Assessment & Plan Note (Addendum)
 Major depressive disorder, recurrent, mild Depression and anxiety well-managed with current medication regimen. Continue taking Wellbutrin  150mg , Ativan  .5mg , as prescribed Will adjust treatment based on symptoms

## 2025-01-08 NOTE — Assessment & Plan Note (Addendum)
 Cervicalgia Chronic neck pain despite nightly Flexeril  use. Missed orthopedist follow-up. - Encouraged orthopedist follow-up for neck pain management.

## 2025-01-08 NOTE — Assessment & Plan Note (Addendum)
 Migraine without aura Chronic migraines inadequately controlled with current medications. Previous sumatriptan  use noted. - Provided Nurtec and Ubrelvy samples for trial. - Instructed to try Nurtec today and Ubrelvy tomorrow to assess efficacy. - Will submit prescription for effective medication to insurance.

## 2025-01-08 NOTE — Assessment & Plan Note (Addendum)
 Controlled Continue taking Dicyclomine  20mg  as prescribed Denies any new or abnormal symptoms Denies any major side effects with the medicine Will adjust treatment based on symptoms

## 2025-01-08 NOTE — Assessment & Plan Note (Addendum)
 Obstructive sleep apnea Denies any new or worsening symptoms Continue using CPAP machine  Continue to monitor symptoms Will inform us  if symptoms change or get worse.

## 2025-01-08 NOTE — Progress Notes (Signed)
 "  Subjective:  Patient ID: Nichole Gonzalez, female    DOB: [birth year]  Age: 49 y.o. MRN: [id number]  Chief Complaint  Patient presents with   Medical Management of Chronic Issues    HPI: Discussed the use of AI scribe software for clinical note transcription with the patient, who gave verbal consent to proceed.  History of Present Illness Nichole Gonzalez is a 49 year old female with chronic migraines and neck pain who presents for a follow-up visit.  She experiences worsening migraines despite daily use of Topamax , which has not alleviated her symptoms. She also uses Excedrin but avoids ibuprofen  due to stomach discomfort. Her migraines typically start from the back and move to the front of her head. She has not missed work due to headaches, but took today off for the appointment. She has previously also tried Sumatriptan  that was not successful. She has not tried Nurtec or Ubrelvy before.  She continues to experience neck pain, initially relieved by injections but has since returned. She takes Flexeril  every night but still wakes up with discomfort. She has tried different pillows without success. She has not scheduled a follow-up with the orthopedist due to work commitments.  She has a history of low potassium levels, previously managed with oral potassium supplements. She last took potassium supplements about two to three weeks ago. She was unable to receive home delivery of her medication due to her remote location and has been picking it up from the pharmacy.  She experiences daily leg pain and has been evaluated by a vascular specialist who determined it was not severe. She notices occasional swelling.  She has been struggling with weight management and has tried various medications including phentermine , Vyvanse , and Wellbutrin  without success. She has not tried Curator. She reports difficulty waking up early to exercise and has been going to bed later than usual.  She  reports stable depression and anxiety, managed with current medications. She has not experienced any worsening of these conditions.  She has sleep apnea, which is currently managed, but she has not been able to continue physical therapy due to work constraints.          01/08/2025    8:19 AM 11/05/2024    3:35 PM 09/14/2024    1:28 PM 07/16/2024    8:03 AM 05/20/2024    7:29 AM  Depression screen PHQ 2/9  Decreased Interest 0 0 0 0 0  Down, Depressed, Hopeless 0 0 0 0 0  PHQ - 2 Score 0 0 0 0 0  Altered sleeping 0  0    Tired, decreased energy 0  0    Change in appetite 1  0    Feeling bad or failure about yourself  2  0    Trouble concentrating 3  0    Moving slowly or fidgety/restless 0  0    Suicidal thoughts 0  0    PHQ-9 Score 6  0     Difficult doing work/chores Not difficult at all  Not difficult at all       Data saved with a previous flowsheet row definition        01/08/2025    8:21 AM  Fall Risk   Falls in the past year? 0  Number falls in past yr: 0  Injury with Fall? 0  Risk for fall due to : No Fall Risks  Follow up Falls evaluation completed    Patient Care Team: Milon Cleaves, [place] as  PCP - General (Physician Assistant) Mosher, Kelli A, PA-C as Physician Assistant (Hematology and Oncology) Burnetta Aures, MD as Consulting Physician (Orthopedic Surgery) Laqueta Ozell BIRCH, MD as Consulting Physician (Anesthesiology)   Review of Systems  Constitutional:  Negative for appetite change, fatigue and fever.  HENT:  Negative for congestion, ear pain, sinus pressure and sore throat.   Respiratory:  Negative for cough, chest tightness, shortness of breath and wheezing.   Cardiovascular:  Negative for chest pain and palpitations.  Gastrointestinal:  Negative for abdominal pain, constipation, diarrhea, nausea and vomiting.  Genitourinary:  Negative for dysuria and hematuria.  Musculoskeletal:  Positive for neck pain. Negative for arthralgias, back pain, joint  swelling and myalgias.  Skin:  Negative for rash.  Neurological:  Positive for headaches. Negative for dizziness and weakness.  Psychiatric/Behavioral:  Negative for dysphoric mood. The patient is not nervous/anxious.     Medications Ordered Prior to Encounter[1] Past Medical History:  Diagnosis Date   Abnormal thyroid  stimulating hormone (TSH) level 02/17/2015   Abnormal uterine bleeding 10/07/2020   Allergic rhinitis 09/03/2015   Depression, recurrent 09/08/2020   Gastroesophageal reflux disease 10/27/2020   Heart murmur 1988   I was young   High risk sexual behavior 11/06/2020   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   Hyperlipidemia 2023   Recently   Primary insomnia 09/08/2020   Tobacco abuse 09/03/2015   Past Surgical History:  Procedure Laterality Date   ABDOMINAL HYSTERECTOMY     APPENDECTOMY     CHOLECYSTECTOMY     DILATION AND CURETTAGE, DIAGNOSTIC / THERAPEUTIC     JOINT REPLACEMENT     KNEE ARTHROSCOPY     right foot     TUBAL LIGATION      Family History  Problem Relation Age of Onset   Thyroid  disease Sister    Coronary artery disease Father    Heart attack Father        69 he dies at age 22. His heart skipped a beat and didnt start back.   Heart disease Father        I know he had heart disease, not sure if it was a heart attach.   COPD Sister    Diabetes Mellitus II Sister    Social History   Socioeconomic History   Marital status: Married    Spouse name: Not on file   Number of children: 3   Years of education: Not on file   Highest education level: Bachelor's degree (e.g., BA, AB, BS)  Occupational History   Not on file  Tobacco Use   Smoking status: Former    Current packs/day: 0.25    Average packs/day: 1 pack/day for 31.2 years (30.5 ttl pk-yrs)    Types: Cigarettes    Start date: 10/25/2023    Passive exposure: Never   Smokeless tobacco: Never  Vaping Use   Vaping status: Never Used  Substance and Sexual Activity   Alcohol use: Yes     Alcohol/week: 8.0 standard drinks of alcohol    Types: 8 Standard drinks or equivalent per week    Comment: socially   Drug use: Never   Sexual activity: Not on file  Other Topics Concern   Not on file  Social History Narrative   ** Merged History Encounter **       Social Drivers of Health   Tobacco Use: Medium Risk (01/08/2025)   Patient History    Smoking Tobacco Use: Former    Smokeless Tobacco Use: Never  Passive Exposure: Never  Physicist, Medical Strain: Low Risk (01/08/2025)   Overall Financial Resource Strain (CARDIA)    Difficulty of Paying Living Expenses: Not very hard  Food Insecurity: No Food Insecurity (01/08/2025)   Epic    Worried About Programme Researcher, Broadcasting/film/video in the Last Year: Never true    Ran Out of Food in the Last Year: Never true  Transportation Needs: No Transportation Needs (01/08/2025)   Epic    Lack of Transportation (Medical): No    Lack of Transportation (Non-Medical): No  Physical Activity: Inactive (01/08/2025)   Exercise Vital Sign    Days of Exercise per Week: 0 days    Minutes of Exercise per Session: Not on file  Stress: No Stress Concern Present (01/08/2025)   [employer] of Occupational Health - Occupational Stress Questionnaire    Feeling of Stress: Only a little  Social Connections: Moderately Isolated (01/08/2025)   Social Connection and Isolation Panel    Frequency of Communication with Friends and Family: More than three times a week    Frequency of Social Gatherings with Friends and Family: More than three times a week    Attends Religious Services: Never    Database Administrator or Organizations: No    Attends Engineer, Structural: Not on file    Marital Status: Married  Depression (PHQ2-9): Medium Risk (01/08/2025)   Depression (PHQ2-9)    PHQ-2 Score: 6  Alcohol Screen: Low Risk (01/08/2025)   Alcohol Screen    Last Alcohol Screening Score (AUDIT): 1  Housing: Low Risk (01/08/2025)   Epic    Unable to Pay for  Housing in the Last Year: No    Number of Times Moved in the Last Year: 0    Homeless in the Last Year: No  Utilities: Not At Risk (01/28/2024)   Received from [facility] Utilities    Threatened with loss of utilities: No  Health Literacy: Adequate Health Literacy (10/04/2023)   B1300 Health Literacy    Frequency of need for help with medical instructions: Never    Objective:  BP 124/86 (BP Location: Right Arm, Patient Position: Sitting)   Pulse 82   Temp 97.8 F (36.6 C) (Temporal)   Ht 5' 6 (1.676 m)   Wt 225 lb 8 oz (102.3 kg)   LMP 01/04/2016   SpO2 100%   BMI 36.40 kg/m      01/08/2025    8:24 AM 11/11/2024    8:00 AM 11/05/2024    3:38 PM  BP/Weight  Systolic BP 124 128 130  Diastolic BP 86 84 78  Wt. (Lbs) 225.5 221   BMI 36.4 kg/m2 35.67 kg/m2     Physical Exam Vitals reviewed.  Constitutional:      Appearance: Normal appearance.  Cardiovascular:     Rate and Rhythm: Normal rate and regular rhythm.     Heart sounds: Normal heart sounds.  Pulmonary:     Effort: Pulmonary effort is normal.     Breath sounds: Normal breath sounds.  Abdominal:     General: Bowel sounds are normal.     Palpations: Abdomen is soft.     Tenderness: There is no abdominal tenderness.  Musculoskeletal:        General: Tenderness present.     Cervical back: Tenderness present.  Neurological:     Mental Status: She is alert and oriented to person, place, and time.  Psychiatric:  Mood and Affect: Mood normal.        Behavior: Behavior normal.       Lab Results  Component Value Date   WBC 11.6 (H) 11/05/2024   HGB 14.3 11/05/2024   HCT 42.0 11/05/2024   PLT 314 11/05/2024   GLUCOSE 121 (H) 11/05/2024   CHOL 177 04/08/2024   TRIG 157 (H) 04/08/2024   HDL 42 04/08/2024   LDLCALC 107 (H) 04/08/2024   ALT 40 11/05/2024   AST 48 (H) 11/05/2024   NA 142 11/05/2024   K 2.7 (LL) 11/05/2024   CL 104 11/05/2024   CREATININE 0.88 11/05/2024    BUN 5 (L) 11/05/2024   CO2 26 11/05/2024   TSH 0.688 01/03/2024   HGBA1C 5.5 01/03/2024    Results for orders placed or performed in visit on 11/05/24  Iron and TIBC   Collection Time: 11/05/24  3:08 PM  Result Value Ref Range   Iron 49 28 - 170 ug/dL   TIBC 760 (L) 749 - 549 ug/dL   Saturation Ratios 21 10.4 - 31.8 %   UIBC 190 ug/dL  Ferritin   Collection Time: 11/05/24  3:08 PM  Result Value Ref Range   Ferritin 373 (H) 11 - 307 ng/mL  CMP (Cancer Center only)   Collection Time: 11/05/24  3:08 PM  Result Value Ref Range   Sodium 142 135 - 145 mmol/L   Potassium 2.7 (LL) 3.5 - 5.1 mmol/L   Chloride 104 98 - 111 mmol/L   CO2 26 22 - 32 mmol/L   Glucose, Bld 121 (H) 70 - 99 mg/dL   BUN 5 (L) 6 - 20 mg/dL   Creatinine 9.11 9.55 - 1.00 mg/dL   Calcium  8.8 (L) 8.9 - 10.3 mg/dL   Total Protein 6.7 6.5 - 8.1 g/dL   Albumin 3.8 3.5 - 5.0 g/dL   AST 48 (H) 15 - 41 U/L   ALT 40 0 - 44 U/L   Alkaline Phosphatase 118 38 - 126 U/L   Total Bilirubin <0.2 0.0 - 1.2 mg/dL   GFR, Estimated >39 >39 mL/min   Anion gap 12 5 - 15  CBC with Differential (Cancer Center Only)   Collection Time: 11/05/24  3:08 PM  Result Value Ref Range   WBC Count 11.6 (H) 4.0 - 10.5 K/uL   RBC 4.54 3.87 - 5.11 MIL/uL   Hemoglobin 14.3 12.0 - 15.0 g/dL   HCT 57.9 63.9 - 53.9 %   MCV 92.5 80.0 - 100.0 fL   MCH 31.5 26.0 - 34.0 pg   MCHC 34.0 30.0 - 36.0 g/dL   RDW 86.4 88.4 - 84.4 %   Platelet Count 314 150 - 400 K/uL   nRBC 0.0 0.0 - 0.2 %   Neutrophils Relative % 51 %   Neutro Abs 5.8 1.7 - 7.7 K/uL   Lymphocytes Relative 41 %   Lymphs Abs 4.7 (H) 0.7 - 4.0 K/uL   Monocytes Relative 5 %   Monocytes Absolute 0.6 0.1 - 1.0 K/uL   Eosinophils Relative 2 %   Eosinophils Absolute 0.3 0.0 - 0.5 K/uL   Basophils Relative 1 %   Basophils Absolute 0.1 0.0 - 0.1 K/uL   Immature Granulocytes 0 %   Abs Immature Granulocytes 0.02 0.00 - 0.07 K/uL  .  Assessment & Plan:   Assessment & Plan Mixed  hyperlipidemia Hyperlipidemia management shows mixed results with increased triglycerides and decreased LDL levels. Current pharmacotherapy for weight management is not effectively addressing  lipid levels. Continue to monitor diet and exercise Labs drawn today Continue taking Lipitor 20mg  as prescribed Will adjust treatment based on results Lab Results  Component Value Date   LDLCALC 107 (H) 04/08/2024    Orders:   CBC with Differential/Platelet   Comprehensive metabolic panel with GFR   Lipid panel   Primary insomnia Denies any new or worsening symptoms Continue to monitor Continue taking Trazodone  100mg  as prescribed Will adjust treatment based on symptoms     OSA (obstructive sleep apnea) Obstructive sleep apnea Denies any new or worsening symptoms Continue using CPAP machine  Continue to monitor symptoms Will inform us  if symptoms change or get worse.    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 Continue taking Wellbutrin  150mg , Ativan  .5mg , as prescribed Will adjust treatment based on symptoms    Migraine without aura and without status migrainosus, not intractable Migraine without aura Chronic migraines inadequately controlled with current medications. Previous sumatriptan  use noted. - Provided Nurtec and Ubrelvy samples for trial. - Instructed to try Nurtec today and Ubrelvy tomorrow to assess efficacy. - Will submit prescription for effective medication to insurance.    Irritable bowel syndrome with diarrhea Controlled Continue taking Dicyclomine  20mg  as prescribed Denies any new or abnormal symptoms Denies any major side effects with the medicine Will adjust treatment based on symptoms     Neck pain Cervicalgia Chronic neck pain despite nightly Flexeril  use. Missed orthopedist follow-up. - Encouraged orthopedist follow-up for neck pain management.     Obesity, Class II, BMI 35-39.9 Obesity, class 2 Previous phentermine  and Vyvanse  trials ineffective. Discussed Contrave as a potential option. - Provided Contrave samples with titration schedule. - Consider protein shakes like Fairlife for dietary support. Orders:   Hemoglobin A1c  Hypokalemia Hypokalemia Previous low potassium levels requiring supplementation. Has not taken potassium for at least three weeks. - Checked potassium levels today. - Arrange IV potassium infusion if levels critically low.      Body mass index is 36.4 kg/m.     No orders of the defined types were placed in this encounter.   Orders Placed This Encounter  Procedures   CBC with Differential/Platelet   Comprehensive metabolic panel with GFR   Lipid panel   Hemoglobin A1c       Follow-up: No follow-ups on file.  An After Visit Summary was printed and given to the patient.    I,Lauren M Auman,acting as a neurosurgeon for [employer], [employer].,have documented all relevant documentation on the behalf of Nola Angles, PA,as directed by  Nola Angles, PA while in the presence of Nola Angles, [place].    Nola Angles, [place] [facility] [phone]     [1]  Current Outpatient Medications on File Prior to Visit  Medication Sig Dispense Refill   albuterol  (VENTOLIN  HFA) 108 (90 Base) MCG/ACT inhaler Inhale 2 puffs into the lungs every 6 (six) hours as needed for wheezing or shortness of breath. 6.7 g 2   atorvastatin  (LIPITOR) 20 MG tablet Take 1 tablet (20 mg total) by mouth daily. 90 tablet 3   buPROPion  (WELLBUTRIN  XL) 150 MG 24 hr tablet Take 3 tablets (450 mg total) by mouth daily. 270 tablet 3   celecoxib  (CELEBREX ) 100 MG capsule Take 1 capsule (100 mg total) by mouth 2 (two) times daily. 20 capsule 0   cetirizine  (ZYRTEC  ALLERGY) 10 MG tablet Take 1 tablet (10 mg total) by mouth daily. 90 tablet 3  citalopram  (CELEXA ) 40 MG tablet Take 1 tablet (40 mg total) by mouth daily. 90 tablet 3    cyclobenzaprine  (FLEXERIL ) 10 MG tablet Take 1 tablet (10 mg total) by mouth at bedtime. 90 tablet 1   dicyclomine  (BENTYL ) 20 MG tablet Take 1 tablet (20 mg total) by mouth every 6 (six) hours. 360 tablet 3   EPIPEN 2-PAK 0.3 MG/0.3ML SOAJ injection  (Patient taking differently: Inject 0.3 mg into the muscle as needed for anaphylaxis.)  0   fluticasone  (FLONASE ) 50 MCG/ACT nasal spray Place 1 spray into both nostrils once daily. 48 g 3   lisdexamfetamine  (VYVANSE ) 60 MG capsule Take 1 capsule (60 mg total) by mouth every morning. 30 capsule 0   LORazepam  (ATIVAN ) 0.5 MG tablet Take 1 tablet (0.5 mg total) by mouth 2 (two) times daily as needed (panic attacks). 30 tablet 1   Multiple Vitamins-Minerals (MULTIVITAMIN WITH MINERALS) tablet Take 1 tablet by mouth daily.     omeprazole  (PRILOSEC) 40 MG capsule Take 1 capsule (40 mg total) by mouth daily. 90 capsule 3   sucralfate  (CARAFATE ) 1 g tablet Take 1 tablet (1 g total) by mouth 2 (two) times daily. 60 tablet 3   topiramate  (TOPAMAX ) 100 MG tablet Take 1 tablet (100 mg total) by mouth daily. 90 tablet 0   traZODone  (DESYREL ) 100 MG tablet Take 1 tablet (100 mg total) by mouth at bedtime. 90 tablet 0   potassium chloride  SA (KLOR-CON  M) 20 MEQ tablet Take 1 tablet (20 mEq total) by mouth 2 (two) times daily. (Patient not taking: Reported on 01/08/2025) 14 tablet 0   No current facility-administered medications on file prior to visit.   "

## 2025-01-08 NOTE — Assessment & Plan Note (Addendum)
 Hyperlipidemia management shows mixed results with increased triglycerides and decreased LDL levels. Current pharmacotherapy for weight management is not effectively addressing lipid levels. Continue to monitor diet and exercise Labs drawn today Continue taking Lipitor 20mg  as prescribed Will adjust treatment based on results Lab Results  Component Value Date   LDLCALC 107 (H) 04/08/2024    Orders:   CBC with Differential/Platelet   Comprehensive metabolic panel with GFR   Lipid panel

## 2025-01-08 NOTE — Assessment & Plan Note (Signed)
 Hypokalemia Previous low potassium levels requiring supplementation. Has not taken potassium for at least three weeks. - Checked potassium levels today. - Arrange IV potassium infusion if levels critically low.

## 2025-01-09 LAB — COMPREHENSIVE METABOLIC PANEL WITH GFR
ALT: 28 IU/L (ref 0–32)
AST: 20 IU/L (ref 0–40)
Albumin: 4.4 g/dL (ref 3.9–4.9)
Alkaline Phosphatase: 114 IU/L (ref 41–116)
BUN/Creatinine Ratio: 6 — ABNORMAL LOW (ref 9–23)
BUN: 5 mg/dL — ABNORMAL LOW (ref 6–24)
Bilirubin Total: 0.3 mg/dL (ref 0.0–1.2)
CO2: 20 mmol/L (ref 20–29)
Calcium: 9.1 mg/dL (ref 8.7–10.2)
Chloride: 107 mmol/L — ABNORMAL HIGH (ref 96–106)
Creatinine, Ser: 0.87 mg/dL (ref 0.57–1.00)
Globulin, Total: 2.5 g/dL (ref 1.5–4.5)
Glucose: 92 mg/dL (ref 70–99)
Potassium: 3.8 mmol/L (ref 3.5–5.2)
Sodium: 142 mmol/L (ref 134–144)
Total Protein: 6.9 g/dL (ref 6.0–8.5)
eGFR: 81 mL/min/1.73

## 2025-01-09 LAB — CBC WITH DIFFERENTIAL/PLATELET
Basophils Absolute: 0.1 x10E3/uL (ref 0.0–0.2)
Basos: 1 %
EOS (ABSOLUTE): 0.1 x10E3/uL (ref 0.0–0.4)
Eos: 1 %
Hematocrit: 44.8 % (ref 34.0–46.6)
Hemoglobin: 15.4 g/dL (ref 11.1–15.9)
Immature Grans (Abs): 0 x10E3/uL (ref 0.0–0.1)
Immature Granulocytes: 0 %
Lymphocytes Absolute: 4.3 x10E3/uL — ABNORMAL HIGH (ref 0.7–3.1)
Lymphs: 42 %
MCH: 32.4 pg (ref 26.6–33.0)
MCHC: 34.4 g/dL (ref 31.5–35.7)
MCV: 94 fL (ref 79–97)
Monocytes Absolute: 0.5 x10E3/uL (ref 0.1–0.9)
Monocytes: 5 %
Neutrophils Absolute: 5.3 x10E3/uL (ref 1.4–7.0)
Neutrophils: 51 %
Platelets: 288 x10E3/uL (ref 150–450)
RBC: 4.76 x10E6/uL (ref 3.77–5.28)
RDW: 12.9 % (ref 11.7–15.4)
WBC: 10.2 x10E3/uL (ref 3.4–10.8)

## 2025-01-09 LAB — LIPID PANEL
Chol/HDL Ratio: 3.7 ratio (ref 0.0–4.4)
Cholesterol, Total: 176 mg/dL (ref 100–199)
HDL: 47 mg/dL
LDL Chol Calc (NIH): 108 mg/dL — ABNORMAL HIGH (ref 0–99)
Triglycerides: 116 mg/dL (ref 0–149)
VLDL Cholesterol Cal: 21 mg/dL (ref 5–40)

## 2025-01-09 LAB — HEMOGLOBIN A1C
Est. average glucose Bld gHb Est-mCnc: 117 mg/dL
Hgb A1c MFr Bld: 5.7 % — ABNORMAL HIGH (ref 4.8–5.6)

## 2025-01-13 ENCOUNTER — Ambulatory Visit: Payer: Self-pay | Admitting: Physician Assistant

## 2025-01-15 ENCOUNTER — Other Ambulatory Visit (HOSPITAL_COMMUNITY): Payer: Self-pay

## 2025-01-19 ENCOUNTER — Other Ambulatory Visit (HOSPITAL_COMMUNITY): Payer: Self-pay

## 2025-01-19 ENCOUNTER — Encounter: Payer: Self-pay | Admitting: Physician Assistant

## 2025-01-19 ENCOUNTER — Other Ambulatory Visit: Payer: Self-pay

## 2025-01-19 ENCOUNTER — Other Ambulatory Visit: Payer: Self-pay | Admitting: Physician Assistant

## 2025-01-19 DIAGNOSIS — E782 Mixed hyperlipidemia: Secondary | ICD-10-CM

## 2025-01-19 MED ORDER — ATORVASTATIN CALCIUM 20 MG PO TABS
20.0000 mg | ORAL_TABLET | Freq: Every day | ORAL | 3 refills | Status: AC
  Filled 2025-01-19 (×2): qty 90, 90d supply, fill #0

## 2025-01-20 ENCOUNTER — Other Ambulatory Visit: Payer: Self-pay

## 2025-01-27 ENCOUNTER — Other Ambulatory Visit: Payer: Self-pay

## 2025-01-29 ENCOUNTER — Other Ambulatory Visit: Payer: Self-pay

## 2025-01-29 ENCOUNTER — Other Ambulatory Visit: Payer: Self-pay | Admitting: Physician Assistant

## 2025-01-29 DIAGNOSIS — Z6837 Body mass index (BMI) 37.0-37.9, adult: Secondary | ICD-10-CM

## 2025-01-29 DIAGNOSIS — E782 Mixed hyperlipidemia: Secondary | ICD-10-CM

## 2025-01-29 MED ORDER — NALTREXONE-BUPROPION HCL ER 8-90 MG PO TB12
ORAL_TABLET | ORAL | 2 refills | Status: AC
  Filled 2025-01-29: qty 70, 28d supply, fill #0

## 2025-05-05 ENCOUNTER — Inpatient Hospital Stay

## 2025-05-05 ENCOUNTER — Inpatient Hospital Stay: Admitting: Hematology and Oncology

## 2025-07-02 ENCOUNTER — Ambulatory Visit: Admitting: Physician Assistant

## 2025-07-20 ENCOUNTER — Encounter: Admitting: Physician Assistant
